# Patient Record
Sex: Male | Born: 1970 | Race: White | Hispanic: No | Smoking: Current some day smoker
Health system: Southern US, Community
[De-identification: ages and names within clinical notes are randomized; demographics above are authoritative.]

## PROBLEM LIST (undated history)

## (undated) DIAGNOSIS — F329 Major depressive disorder, single episode, unspecified: Secondary | ICD-10-CM

## (undated) DIAGNOSIS — I10 Essential (primary) hypertension: Secondary | ICD-10-CM

## (undated) DIAGNOSIS — F32A Depression, unspecified: Secondary | ICD-10-CM

## (undated) DIAGNOSIS — F17200 Nicotine dependence, unspecified, uncomplicated: Secondary | ICD-10-CM

## (undated) DIAGNOSIS — E785 Hyperlipidemia, unspecified: Secondary | ICD-10-CM

## (undated) DIAGNOSIS — A159 Respiratory tuberculosis unspecified: Secondary | ICD-10-CM

## (undated) DIAGNOSIS — R809 Proteinuria, unspecified: Secondary | ICD-10-CM

## (undated) DIAGNOSIS — E119 Type 2 diabetes mellitus without complications: Secondary | ICD-10-CM

## (undated) DIAGNOSIS — F909 Attention-deficit hyperactivity disorder, unspecified type: Secondary | ICD-10-CM

## (undated) DIAGNOSIS — K219 Gastro-esophageal reflux disease without esophagitis: Secondary | ICD-10-CM

## (undated) DIAGNOSIS — E1169 Type 2 diabetes mellitus with other specified complication: Secondary | ICD-10-CM

## (undated) DIAGNOSIS — F419 Anxiety disorder, unspecified: Secondary | ICD-10-CM

## (undated) DIAGNOSIS — N529 Male erectile dysfunction, unspecified: Secondary | ICD-10-CM

## (undated) DIAGNOSIS — G4700 Insomnia, unspecified: Secondary | ICD-10-CM

## (undated) DIAGNOSIS — R6882 Decreased libido: Secondary | ICD-10-CM

## (undated) DIAGNOSIS — F4010 Social phobia, unspecified: Secondary | ICD-10-CM

## (undated) DIAGNOSIS — S43006A Unspecified dislocation of unspecified shoulder joint, initial encounter: Secondary | ICD-10-CM

## (undated) HISTORY — DX: Major depressive disorder, single episode, unspecified: F32.9

## (undated) HISTORY — DX: Type 2 diabetes mellitus without complications: E11.9

## (undated) HISTORY — DX: Social phobia, unspecified: F40.10

## (undated) HISTORY — DX: Hyperlipidemia, unspecified: E78.5

## (undated) HISTORY — DX: Nicotine dependence, unspecified, uncomplicated: F17.200

## (undated) HISTORY — DX: Male erectile dysfunction, unspecified: N52.9

## (undated) HISTORY — DX: Proteinuria, unspecified: R80.9

## (undated) HISTORY — PX: NO PAST SURGERIES: SHX2092

## (undated) HISTORY — DX: Insomnia, unspecified: G47.00

## (undated) HISTORY — DX: Type 2 diabetes mellitus with other specified complication: E11.69

## (undated) HISTORY — DX: Depression, unspecified: F32.A

## (undated) HISTORY — DX: Attention-deficit hyperactivity disorder, unspecified type: F90.9

## (undated) HISTORY — DX: Unspecified dislocation of unspecified shoulder joint, initial encounter: S43.006A

## (undated) HISTORY — DX: Anxiety disorder, unspecified: F41.9

## (undated) HISTORY — DX: Decreased libido: R68.82

---

## 2006-07-13 DIAGNOSIS — S43006A Unspecified dislocation of unspecified shoulder joint, initial encounter: Secondary | ICD-10-CM

## 2006-07-13 HISTORY — DX: Unspecified dislocation of unspecified shoulder joint, initial encounter: S43.006A

## 2012-02-19 ENCOUNTER — Encounter (HOSPITAL_COMMUNITY): Payer: Self-pay | Admitting: Psychiatry

## 2012-02-19 ENCOUNTER — Ambulatory Visit (INDEPENDENT_AMBULATORY_CARE_PROVIDER_SITE_OTHER): Payer: BC Managed Care – PPO | Admitting: Psychiatry

## 2012-02-19 VITALS — BP 138/96 | HR 75 | Ht 71.0 in | Wt 260.0 lb

## 2012-02-19 DIAGNOSIS — F4010 Social phobia, unspecified: Secondary | ICD-10-CM

## 2012-02-19 DIAGNOSIS — F4001 Agoraphobia with panic disorder: Secondary | ICD-10-CM

## 2012-02-19 MED ORDER — PROPRANOLOL HCL 10 MG PO TABS: ORAL_TABLET | ORAL | Status: DC

## 2012-02-19 MED ORDER — SERTRALINE HCL 50 MG PO TABS: ORAL_TABLET | ORAL | Status: DC

## 2012-02-19 NOTE — Progress Notes (Signed)
Psychiatric Assessment Adult  Patient Identification:  Albert [name] Date of Evaluation:  02/19/2012  Chief Complaint:   Chief Complaint  Patient presents with  . Anxiety   History of Chief Complaint:   Albert [name] is a 41 y/o male with a past psychiatric history significant for symptoms of anxiety. The patient is referred for psychiatric services for psychiatric evaluation and medication management.   Anxiety Presents for initial visit. Onset was 1 to 5 years ago (Patient reports he always been "shy".   He states he  was still social  and this started 5 years ago. He  states that  he look up what was wrong on the computer, took a survey which diagnosed him with SAD.). The problem has been gradually worsening (He reports he is getting to point where he can't go out and  has difficultly with crowds.). Symptoms include excessive worry (About people thinking about him.), nausea, nervous/anxious behavior and obsessions (worries about being in a meeting every 3 months to hand out an award and has severe anxiety.). Patient reports no chest pain, confusion, depressed mood, dizziness, feeling of choking, hyperventilation (Has occurred 6 years ago, when he was in a situation where.), irritability (He gets irritated with himself.), muscle tension, palpitations, panic, restlessness, shortness of breath or suicidal ideas. Symptoms occur occasionally (He reports that he doesn't put himself in situations where he  will meet other people.). The most recent episode lasted 20 minutes. The severity of symptoms is moderate and interfering with daily activities. The symptoms are aggravated by social activities. The quality of sleep is good. Nighttime awakenings: occasional.   Risk factors include a major life event (Ended a relationship 5 years ago.). There is no history of suicide attempts. Past treatments include nothing. Compliance with prior treatments: No treatments tried.   Review of Systems  Constitutional:  Negative for fever, chills, diaphoresis, activity change, appetite change, irritability (He gets irritated with himself.), fatigue and unexpected weight change.  Respiratory: Negative for apnea, cough, choking, chest tightness, shortness of breath, wheezing and stridor.   Cardiovascular: Negative for chest pain, palpitations and leg swelling.  Gastrointestinal: Positive for nausea and rectal pain. Negative for vomiting, abdominal pain, diarrhea, constipation, blood in stool, abdominal distention and anal bleeding.  Neurological: Negative for dizziness.  Psychiatric/Behavioral: Negative for suicidal ideas and confusion. The patient is nervous/anxious.    Physical Exam  Vitals reviewed. Constitutional: He appears well-developed and well-nourished. No distress.  Skin: He is not diaphoretic.    Depressive Symptoms: anhedonia,  (Hypo) Manic Symptoms:   Elevated Mood:  No Irritable Mood:  No Grandiosity:  No Distractibility:  No Lability of Mood:  No Delusions:  No Hallucinations:  No Impulsivity:  No Sexually Inappropriate Behavior:  No Financial Extravagance:  No Flight of Ideas:  No  Psychotic Symptoms:  Hallucinations: No None Delusions:  No Paranoia:  No   Ideas of Reference:  No  PTSD Symptoms: Ever had a traumatic exposure:  No Had a traumatic exposure in the last month:  No Re-experiencing: No None Hypervigilance:  No Hyperarousal: No None Avoidance: No None  Traumatic Brain Injury: No   Past Psychiatric History: Diagnosis:ADHD treated by Dr. Alphonzo Lemmings  Hospitalizations: Patient denies.  Outpatient Care: Patient was seeing patient up  Until 3 years ago. Saw him for 4-5 years.  Substance Abuse Care: Patient denies.  Self-Mutilation: Patient denies.  Suicidal Attempts: Patient denies  Violent Behaviors: Patient denies.   Past Medical History:   Past Medical History  Diagnosis Date  .  Shoulder dislocation 2008    Left shoulder   History of Loss of Consciousness:   No Seizure History:  No Cardiac History:  No  Allergies:  No Known Allergies  Current Medications:  Current Outpatient Prescriptions  Medication Sig Dispense Refill  . ibuprofen (ADVIL,MOTRIN) 200 MG tablet Take 200 mg by mouth every 6 (six) hours as needed.        Previous Psychotropic Medications:  Medication   Adderall   Substance Abuse History in the last 12 months: Caffeine: 1-2 cups  Tobacco: 1 PPD-25 years Alcohol: 1 drink a year Illicit: Patient denies.  Medical Consequences of Substance Abuse: N/A  Legal Consequences of Substance Abuse: N/A  Family Consequences of Substance Abuse: N/A  Blackouts:  No DT's:  No Withdrawal Symptoms:  No   Social History: Current Place of Residence: [place], [place] Place of Birth: [place], [place] Family Members: Youngest child of three, has an older and younger brother. Marital Status:  Single Children: None Relationships: The patient reports that he has no main source of emotional support. Education:  College-Did 2 years of community. Educational Problems/Performance: Quit school to be close to his girlfriend. Religious Beliefs/Practices: Not practicing. 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 Legal History: None Hobbies/Interests: Watching sports  Family History:   Family History  Problem Relation Age of Onset  . Hypertension Mother   . Diabetes type II Mother   . Hypertension Sister   . Hypertension Maternal Grandmother   . Diabetes type II Maternal Grandmother   . Breast cancer Paternal Grandfather   . Breast cancer Paternal Grandmother     Mental Status Examination/Evaluation: Objective:  Appearance: Casual and Fairly Groomed  Eye Contact::  Minimal  Speech:  Clear and Coherent and Normal Rate  Volume:  Normal  Mood:  "nervous"  Affect:  Restricted  Thought Process:  Coherent, Linear and Logical  Orientation:  Full    Thought Content:  WDL  Suicidal Thoughts:  No  Homicidal Thoughts:  No  Judgement:  Good  Insight:  Good  Psychomotor Activity:  Normal  Akathisia:  No  Handed:  Right  Memory: Intact immediate and recent  Assets:  Communication Skills Desire for Improvement Financial Resources/Insurance Housing Physical Health Social Support Talents/Skills Transportation Vocational/Educational    Laboratory/X-Ray Psychological Evaluation(s)  None  None   Assessment:   AXIS I Panic Disorder with Agoraphobia, Social Anxiety Disorder  AXIS II Cluster C Traits  AXIS III . Shoulder dislocation 2008    Left shoulder     AXIS IV occupational problems  AXIS V 51-60 moderate symptoms   Treatment Plan/Recommendations:  PLAN:  1. Affirm with the patient that the medications are taken as ordered. Patient expressed understanding of how their medications were to be used.  2. Start the following psychiatric medications:  a) Sertraline-50 mg-take a half tablet for 7 days, then one tablet daily. b) Propranolol 10 mg- one half to one tablet daily as needed for anxiety. 3. Therapy: brief supportive therapy provided. Continue current services.  4. Risks and benefits, side effects and alternatives discussed with patient, he/she was given an opportunity to ask questions about his/her medication, illness, and treatment. All current psychiatric  medications have been reviewed and discussed with the patient and adjusted as clinically appropriate. The patient has been provided an accurate and updated list of the medications being now prescribed.  5. Patient told to call clinic if any problems occur. Patient advised  to go to ER  if he should develop SI/HI, side effects, or if symptoms worsen. Has crisis numbers to call if needed.   6. No labs warranted at this time.  7. The patient was encouraged to keep all PCP and specialty clinic appointments.  8. Patient was instructed to return to clinic in 1 month.  9.  The patient was advised to call and cancel their mental health appointment within 24 hours of the appointment, if they are unable to keep the appointment.  10. The patient expressed understanding of the plan and agrees with the above.   Jacqulyn Cane, MD 8/9/20139:00 AM

## 2012-02-22 DIAGNOSIS — F4010 Social phobia, unspecified: Secondary | ICD-10-CM | POA: Insufficient documentation

## 2012-02-22 DIAGNOSIS — F4001 Agoraphobia with panic disorder: Secondary | ICD-10-CM | POA: Insufficient documentation

## 2012-03-17 ENCOUNTER — Ambulatory Visit (INDEPENDENT_AMBULATORY_CARE_PROVIDER_SITE_OTHER): Payer: BC Managed Care – PPO | Admitting: Psychiatry

## 2012-03-17 ENCOUNTER — Encounter (HOSPITAL_COMMUNITY): Payer: Self-pay | Admitting: Psychiatry

## 2012-03-17 VITALS — BP 139/88 | HR 77 | Ht 71.0 in | Wt 254.0 lb

## 2012-03-17 DIAGNOSIS — F4010 Social phobia, unspecified: Secondary | ICD-10-CM

## 2012-03-17 DIAGNOSIS — F411 Generalized anxiety disorder: Secondary | ICD-10-CM

## 2012-03-17 DIAGNOSIS — F4001 Agoraphobia with panic disorder: Secondary | ICD-10-CM

## 2012-03-17 MED ORDER — SERTRALINE HCL 50 MG PO TABS: ORAL_TABLET | ORAL | Status: DC

## 2012-03-17 NOTE — Progress Notes (Signed)
[facility] Behavioral Health Follow-up Outpatient Visit  Albert [name] 02/04/1971  Date: 03/17/2012  History of Chief Complaint:  Albert [name] is a 41 y/o male with a past psychiatric history significant for symptoms of anxiety. The patient is referred for psychiatric services for medication management.   The patient reports that he had an evaluation today at work which . He reports that he was not able to debate his point of view which was frustrating.  In the area of affective symptoms, patient appears euthymic. Patient denies current suicidal ideation, intent, or plan. Patient denies current homicidal ideation, intent, or plan. Patient denies auditory hallucinations. Patient denies visual hallucinations. Patient denies symptoms of paranoia. Patient states sleep is fair, with approximately 6 hours of sleep per night. Appetite is good. Energy level is good. Patient endorses symptoms of anhedonia. Patient denies hopelessness, helplessness, or guilt.   Denies any recent episodes consistent with mania, particularly decreased need for sleep with increased energy, grandiosity, impulsivity, hyperverbal and pressured speech, or increased productivity. Denies any recent symptoms consistent with psychosis, particularly auditory or visual hallucinations, thought broadcasting/insertion/withdrawal, or ideas of reference. 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 Denies any history of trauma or symptoms consistent with PTSD such as flashbacks, nightmares, hypervigilance, feelings of numbness or inability to connect with others.   The patient states he is taking all his medications denies any side effects from his medications. He denies any other complaints.   Review of Systems  Constitutional: Negative for fever, chills, diaphoresis, activity change, appetite change, fatigue and unexpected weight change.  Respiratory: Negative for  apnea, cough, choking, chest tightness, shortness of breath, wheezing and stridor.  Cardiovascular: Negative for chest pain, palpitations and leg swelling.  Gastrointestinal: Negative for nausea, vomiting, abdominal pain, diarrhea, constipation, blood in stool, abdominal distention and anal bleeding.  Neurological: Negative for dizziness.   Filed Vitals:   03/17/12 1034  BP: 139/88  Pulse: 77  Height: 5\' 11"  (1.803 m)  Weight: 254 lb (115.214 kg)   Physical Exam  Vitals reviewed.  Constitutional: He appears well-developed and well-nourished. No distress.  Skin: He is not diaphoretic.   Traumatic Brain Injury: No   Past Psychiatric History: Reviewed Diagnosis:ADHD treated by Dr. Alphonzo Lemmings   Hospitalizations: Patient denies.   Outpatient Care: Patient was seeing patient up Until 3 years ago. Saw him for 4-5 years.   Substance Abuse Care: Patient denies.   Self-Mutilation: Patient denies.   Suicidal Attempts: Patient denies   Violent Behaviors: Patient denies.    Past Medical History: Reviewed Past Medical History   Diagnosis  Date   .  Shoulder dislocation  2008     Left shoulder    History of Loss of Consciousness: No  Seizure History: No  Cardiac History: No  Allergies: No Known Allergies   Current Medications: Reviewed Current Outpatient Prescriptions on File Prior to Visit  Medication Sig Dispense Refill  . ibuprofen (ADVIL,MOTRIN) 200 MG tablet Take 200 mg by mouth every 6 (six) hours as needed.      . propranolol (INDERAL) 10 MG tablet Take one half to one tablet daily.  30 tablet  0  . sertraline (ZOLOFT) 50 MG tablet Take a half tablet for 7 days, then increase to one tablet daily.  30 tablet  0   Previous Psychotropic Medications: Reviewed Medication   Adderall    Substance Abuse History in the last 12 months:  Caffeine: 1-2  cups  Tobacco: 1 PPD-25 years  Alcohol: 1 drink a year  Illicit: Patient denies.   Medical Consequences of Substance Abuse: N/A    Legal Consequences of Substance Abuse: N/A  Family Consequences of Substance Abuse: N/A  Blackouts: No  DT's: No  Withdrawal Symptoms: No   Social History: Reviewed Current Place of Residence: [place], [place]  Place of Birth: [place], [place]  Family Members: Youngest child of three, has an older and younger brother.  Marital Status: Single  Children: None  Relationships: The patient reports that he has no main source of emotional support.  Education: College-Did 2 years of community.  Educational Problems/Performance: Quit school to be close to his girlfriend.  Religious Beliefs/Practices: Not practicing.  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  Legal History: None  Hobbies/Interests: Watching sports   Family History: Reviewed Family History   Problem  Relation  Age of Onset   .  Hypertension  Mother    .  Diabetes type II  Mother    .  Hypertension  Sister    .  Hypertension  Maternal Grandmother    .  Diabetes type II  Maternal Grandmother    .  Breast cancer  Paternal Grandfather    .  Breast cancer  Paternal Grandmother      Mental Status Examination/Evaluation:  Objective: Appearance: Casual and Fairly Groomed   Eye Contact:: Minimal   Speech: Clear and Coherent and Normal Rate   Volume: Normal   Mood: "normal"   Affect: Restricted   Thought Process: Coherent, Linear and Logical   Orientation: Full   Thought Content: WDL   Suicidal Thoughts: No   Homicidal Thoughts: No   Judgement: Good   Insight: Good   Psychomotor Activity: Normal   Akathisia: No   Handed: Right   Memory: Intact immediate and recent   Assets: Communication Skills  Desire for Improvement  Financial Resources/Insurance  Housing  Physical Health  Social Support  Talents/Skills  Transportation  Vocational/Educational    Laboratory/X-Ray  Psychological Evaluation(s)   None  None    Assessment:  AXIS  I  Panic Disorder with Agoraphobia, Social Anxiety Disorder   AXIS II  Cluster C Traits   AXIS III  .  Shoulder dislocation  2008      Left shoulder   AXIS IV  occupational problems   AXIS V  51-60 moderate symptoms    Treatment Plan/Recommendations:    1. Affirm with the patient that the medications are taken as ordered. Patient expressed understanding of how their medications were to be used.  2. Start the following psychiatric medications:  a) Increase Sertraline-50 mg-one and a half tablet for 14 days, and two tablets weeks.  b) Propranolol 10 mg- one half to one tablet daily as needed for anxiety. Asked patient to take his first dose in the presence of a family member given possible hypotensive episode. 3. Therapy: brief supportive therapy provided. Continue current services.  4. Risks and benefits, side effects and alternatives discussed with patient, he was given an opportunity to ask questions about his/her medication, illness, and treatment. All current psychiatric  medications have been reviewed and discussed with the patient and adjusted as clinically appropriate. The patient has been provided an accurate and updated list of the medications being now prescribed.  5. Patient told to call clinic if any problems occur. Patient advised to go to ER if he should  develop SI/HI, side effects, or if symptoms worsen. Has crisis numbers to call if needed.  6. No labs warranted at this time.  7. The patient was encouraged to keep all PCP and specialty clinic appointments.  8. Patient was instructed to return to clinic in 1 month.  9. The patient was advised to call and cancel their mental health appointment within 24 hours of the appointment, if they are unable to keep the appointment.  10. The patient expressed understanding of the plan and agrees with the above.  Jacqulyn Cane, MD

## 2012-04-21 ENCOUNTER — Ambulatory Visit (HOSPITAL_COMMUNITY): Payer: BC Managed Care – PPO | Admitting: Psychiatry

## 2012-04-27 ENCOUNTER — Ambulatory Visit (HOSPITAL_COMMUNITY): Payer: BC Managed Care – PPO | Admitting: Psychiatry

## 2012-05-03 ENCOUNTER — Encounter (HOSPITAL_COMMUNITY): Payer: Self-pay | Admitting: Psychiatry

## 2012-05-03 ENCOUNTER — Ambulatory Visit (INDEPENDENT_AMBULATORY_CARE_PROVIDER_SITE_OTHER): Payer: BC Managed Care – PPO | Admitting: Psychiatry

## 2012-05-03 VITALS — BP 134/88 | HR 86 | Ht 71.0 in | Wt 258.0 lb

## 2012-05-03 DIAGNOSIS — F4010 Social phobia, unspecified: Secondary | ICD-10-CM

## 2012-05-03 DIAGNOSIS — F4001 Agoraphobia with panic disorder: Secondary | ICD-10-CM

## 2012-05-03 DIAGNOSIS — F411 Generalized anxiety disorder: Secondary | ICD-10-CM

## 2012-05-03 MED ORDER — SERTRALINE HCL 100 MG PO TABS: ORAL_TABLET | ORAL | Status: DC

## 2012-05-03 NOTE — Progress Notes (Signed)
[facility] Behavioral Health Follow-up Outpatient Visit  Albert [name] Apr 17, 1971  Date: 05/03/2012  History of Chief Complaint:  Albert [name] is a 41 y/o male with a past psychiatric history significant for symptoms of anxiety. The patient is referred for psychiatric services for medication management.   The patient reports that he had an loss management inspection and he has been at work since 8 AM.  The patient reports that he feels stressed about the increasing workload at work.  He states he would like to start dating again and is interested in a certain person but is not able to speak to her due to anxiety. He reports he is taking his medications as prescribe and denies any side effects.   In the area of affective symptoms, patient appears euthymic. Patient denies current suicidal ideation, intent, or plan. Patient denies current homicidal ideation, intent, or plan. Patient denies auditory hallucinations. Patient denies visual hallucinations. Patient denies symptoms of paranoia. Patient states sleep is poor, with approximately 5 hours of sleep per night, secondary to shift changes. Appetite is good. Energy level is good. Patient endorses symptoms of anhedonia. Patient denies hopelessness, helplessness, or guilt.   Denies any recent episodes consistent with mania, particularly decreased need for sleep with increased energy, grandiosity, impulsivity, hyperverbal and pressured speech, or increased productivity. Denies any recent symptoms consistent with psychosis, particularly auditory or visual hallucinations, thought broadcasting/insertion/withdrawal, or ideas of reference. 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 Denies any history of trauma or symptoms consistent with PTSD such as flashbacks, nightmares, hypervigilance, feelings of numbness or inability to connect with others.  The patient states  he is taking all his medications denies any side effects from his medications. He denies any other complaints.   Review of Systems  Constitutional: Negative for fever, chills, diaphoresis, activity change, appetite change, fatigue and unexpected weight change.  Respiratory: Negative for apnea, cough, choking, chest tightness, shortness of breath, wheezing and stridor.  Cardiovascular: Negative for chest pain, palpitations and leg swelling.  Gastrointestinal: Negative for nausea, vomiting, abdominal pain, diarrhea, constipation, blood in stool, abdominal distention and anal bleeding.    Filed Vitals:   05/03/12 1030  BP: 133/90  Pulse: 86  Height: 5\' 11"  (1.803 m)  Weight: 258 lb (117.028 kg)   Physical Exam  Vitals reviewed.  Constitutional: He appears well-developed and well-nourished. No distress.  Skin: He is not diaphoretic.    Traumatic Brain Injury: No   Past Psychiatric History: Reviewed  Diagnosis:ADHD treated by Dr. Alphonzo Lemmings   Hospitalizations: Patient denies.   Outpatient Care: Patient was seeing patient up Until 3 years ago. Saw him for 4-5 years.   Substance Abuse Care: Patient denies.   Self-Mutilation: Patient denies.   Suicidal Attempts: Patient denies   Violent Behaviors: Patient denies.    Past Medical History: Reviewed  Past Medical History   Diagnosis  Date   .  Shoulder dislocation  2008     Left shoulder    History of Loss of Consciousness: No  Seizure History: No  Cardiac History: No  Allergies: No Known Allergies   Current Medications: Reviewed  Current Outpatient Prescriptions on File Prior to Visit  Medication Sig Dispense Refill  . ibuprofen (ADVIL,MOTRIN) 200 MG tablet Take 200 mg by mouth every 6 (six) hours as needed.      . propranolol (INDERAL) 10 MG tablet Take one tablet daily.  30  tablet  0  .  sertraline (ZOLOFT) 50 MG tablet Take  two tablets daily (100 mg.)  60 tablet  1    Previous Psychotropic Medications: Reviewed  Medication    Adderall    Substance Abuse History in the last 12 months:  Caffeine: None Tobacco: 1 PPD-25 years  Alcohol: 1 drink a year  Illicit: Patient denies.   Medical Consequences of Substance Abuse: N/A  Legal Consequences of Substance Abuse: N/A  Family Consequences of Substance Abuse: N/A  Blackouts: No  DT's: No   Withdrawal Symptoms: No   Social History: Reviewed  Current Place of Residence: [place], [place]  Place of Birth: [place], [place]  Family Members: Youngest child of three, has an older and younger brother.  Marital Status: Single  Children: None  Relationships: The patient reports that he has no main source of emotional support.  Education: College-Did 2 years of community.  Educational Problems/Performance: Quit school to be close to his girlfriend.  Religious Beliefs/Practices: Not practicing.  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  Legal History: None  Hobbies/Interests: Watching sports   Family History: Reviewed  Family History   Problem  Relation  Age of Onset   .  Hypertension  Mother    .  Diabetes type II  Mother    .  Hypertension  Sister    .  Hypertension  Maternal Grandmother    .  Diabetes type II  Maternal Grandmother    .  Breast cancer  Paternal Grandfather    .  Breast cancer  Paternal Grandmother     Mental Status Examination/Evaluation:  Objective: Appearance: Casual and Fairly Groomed   Eye Contact:: Minimal   Speech: Clear and Coherent and Normal Rate   Volume: Normal   Mood: "fine"   Affect: Restricted   Thought Process: Coherent, Linear and Logical   Orientation: Full   Thought Content: WDL   Suicidal Thoughts: No   Homicidal Thoughts: No   Judgement: Good   Insight: Good   Psychomotor Activity: Normal   Akathisia: No   Handed: Right   Memory: Intact immediate and recent   Assets: Communication Skills  Desire for Improvement  Financial  Resources/Insurance  Housing  Physical Health  Social Support  Talents/Skills  Transportation  Vocational/Educational    Laboratory/X-Ray  Psychological Evaluation(s)   None  None    Assessment:  AXIS I  Panic Disorder with Agoraphobia, Social Anxiety Disorder   AXIS II  Cluster C Traits   AXIS III  .  Shoulder dislocation  2008      Left shoulder   AXIS IV  occupational problems   AXIS V  51-60 moderate symptoms    Treatment Plan/Recommendations:  1. Affirm with the patient that the medications are taken as ordered. Patient expressed understanding of how their medications were to be used.  2. Continue the following psychiatric medications with the following changes:  a) Increase Sertraline-100 mg-one and a half tablet for 14 days, and two tablets weeks.  b) Propranolol 10 mg- one and a half to two tablet daily as needed for anxiety. Cautioned patient about side effects of the medication.  3. Therapy: brief supportive therapy provided. Continue current services.  4. Risks and benefits, side effects and alternatives discussed with patient, he was given an opportunity to ask questions about his/her medication, illness, and treatment. All current psychiatric  medications have been reviewed and discussed with  the patient and adjusted as clinically appropriate. The patient has been provided an accurate and updated list of the medications being now prescribed.  5. Patient told to call clinic if any problems occur. Patient advised to go to ER if he should develop SI/HI, side effects, or if symptoms worsen. Has crisis numbers to call if needed.  6. No labs warranted at this time.  7. The patient was encouraged to keep all PCP and specialty clinic appointments.  8. Patient was instructed to return to clinic in 1 month.  9. The patient was advised to call and cancel their mental health appointment within 24 hours of the appointment, if they are unable to keep the appointment.  10. The patient  expressed understanding of the plan and agrees with the above.     Jacqulyn Cane, MD

## 2012-06-03 ENCOUNTER — Ambulatory Visit (HOSPITAL_COMMUNITY): Payer: BC Managed Care – PPO | Admitting: Psychiatry

## 2012-06-14 ENCOUNTER — Encounter (HOSPITAL_COMMUNITY): Payer: Self-pay | Admitting: Psychiatry

## 2012-06-14 ENCOUNTER — Ambulatory Visit (INDEPENDENT_AMBULATORY_CARE_PROVIDER_SITE_OTHER): Payer: BC Managed Care – PPO | Admitting: Psychiatry

## 2012-06-14 VITALS — BP 114/97 | HR 77 | Ht 71.0 in | Wt 270.0 lb

## 2012-06-14 DIAGNOSIS — F4001 Agoraphobia with panic disorder: Secondary | ICD-10-CM

## 2012-06-14 DIAGNOSIS — F4010 Social phobia, unspecified: Secondary | ICD-10-CM

## 2012-06-14 MED ORDER — PROPRANOLOL HCL 10 MG PO TABS: ORAL_TABLET | ORAL | Status: DC

## 2012-06-14 MED ORDER — VENLAFAXINE HCL ER 37.5 MG PO CP24: ORAL_CAPSULE | ORAL | Status: DC

## 2012-06-14 MED ORDER — SERTRALINE HCL 50 MG PO TABS: ORAL_TABLET | ORAL | Status: DC

## 2012-06-14 NOTE — Progress Notes (Signed)
[facility] Follow-up Outpatient Visit  Albert [name] 18-May-1971  Date: 06/14/2012  History of Chief Complaint:  Albert [name] is a 41 y/o male with a past psychiatric history significant for Panic Disorder with Agoraphobia, Social Anxiety Disorder . The patient is referred for psychiatric services for medication management.   The patient endorses continue anxiety and avoidance despite being on the maximum dose of sertraline. 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 The patient request a change of medications.  He endorses continued isolation secondary to social anxiety. He reports using propranolol successfully for anticipated stressful events but would like to change medications to have better control of his anxiety.  In the area of affective symptoms, patient appears anxious. Patient denies current suicidal ideation, intent, or plan. Patient denies current homicidal ideation, intent, or plan. Patient denies auditory hallucinations. Patient denies visual hallucinations. Patient denies symptoms of paranoia. Patient states sleep is good with approximately 5 -7 hours of sleep per night, secondary to shift changes. Appetite is good. Energy level is good. Patient  endorsed symptoms of anhedonia. Patient denies hopelessness, helplessness, or guilt.   Denies any recent episodes consistent with mania, particularly decreased need for sleep with increased energy, grandiosity, impulsivity, hyperverbal and pressured speech, or increased productivity. Denies any recent symptoms consistent with psychosis, particularly auditory or visual hallucinations, thought broadcasting/insertion/withdrawal, or ideas of reference. 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 Denies any history of trauma or symptoms consistent with PTSD such as  flashbacks, nightmares, hypervigilance, feelings of numbness or inability to connect with others.   The patient states he is taking all his medications denies any side effects from his medications. He denies any other complaints.   Review of Systems  Constitutional: Negative for fever, chills, diaphoresis, activity change, appetite change, fatigue and unexpected weight change.  Respiratory: Negative for apnea, cough, choking, chest tightness, shortness of breath, wheezing and stridor.  Cardiovascular: Negative for chest pain, palpitations and leg swelling.  Gastrointestinal: Negative for nausea, vomiting, abdominal pain, diarrhea, constipation, blood in stool, abdominal distention and anal bleeding.   Filed Vitals:   06/14/12 1023  BP: 114/97  Pulse: 77  Height: 5\' 11"  (1.803 m)  Weight: 270 lb (122.471 kg)   Physical Exam  Vitals reviewed.  Constitutional: He appears well-developed and well-nourished. No distress.  Skin: He is not diaphoretic.   Traumatic Brain Injury: No   Past Psychiatric History: Reviewed  Diagnosis:ADHD treated by Dr. Alphonzo Lemmings   Hospitalizations: Patient denies.   Outpatient Care: Patient was seeing patient up Until 3 years ago. Saw him for 4-5 years.   Substance Abuse Care: Patient denies.   Self-Mutilation: Patient denies.   Suicidal Attempts: Patient denies   Violent Behaviors: Patient denies.    Past Medical History: Reviewed  Past Medical History  Diagnosis Date  . Shoulder dislocation 2008    Left shoulder   History of Loss of Consciousness: No  Seizure History: No  Cardiac History: No  Allergies: No Known Allergies  Current Medications: Reviewed   Previous Psychotropic Medications: Reviewed  Medication   Adderall    Substance Abuse History in the last 12 months:  Caffeine: None  Tobacco: 1 PPD-25 years  Alcohol: 1 drink a year  Illicit: Patient denies.   Medical Consequences of Substance Abuse: N/A  Legal Consequences of Substance  Abuse:  N/A  Family Consequences of Substance Abuse: N/A  Blackouts: No  DT's: No  Withdrawal Symptoms: No   Social History: Reviewed  Current Place of Residence: [place], [place]  Place of Birth: [place], [place]  Family Members: Youngest child of three, has an older and younger brother.  Marital Status: Single  Children: None  Relationships: The patient reports that he has no main source of emotional support.  Education: College-Did 2 years of community.  Educational Problems/Performance: Quit school to be close to his girlfriend.  Religious Beliefs/Practices: Not practicing.  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  Legal History: None  Hobbies/Interests: Watching sports   Family History: Reviewed  Family History  Problem Relation Age of Onset  . Hypertension Mother   . Diabetes type II Mother   . Hypertension Sister   . Hypertension Maternal Grandmother   . Diabetes type II Maternal Grandmother   . Breast cancer Paternal Grandfather   . Breast cancer Paternal Grandmother      Mental Status Examination/Evaluation:  Objective: Appearance: Casual and Fairly Groomed   Eye Contact:: Minimal   Speech: Clear and Coherent and Normal Rate   Volume: Normal   Mood: "average"   Affect: Restricted   Thought Process: Coherent, Linear and Logical   Orientation: Full   Thought Content: WDL   Suicidal Thoughts: No   Homicidal Thoughts: No   Judgement: Good   Insight: Good   Psychomotor Activity: Normal   Akathisia: No   Handed: Right   Memory: Intact immediate and recent   Assets: Communication Skills  Desire for Improvement  Financial Resources/Insurance  Housing  Physical Health  Social Support  Talents/Skills  Transportation  Vocational/Educational    Laboratory/X-Ray  Psychological Evaluation(s)   None  None   Assessment:  AXIS I  Panic Disorder with Agoraphobia, Social Anxiety  Disorder   AXIS II  Cluster C Traits   AXIS III  .  Shoulder dislocation  2008      Left shoulder   AXIS IV  occupational problems   AXIS V  51-60 moderate symptoms    Treatment Plan/Recommendations:  1. Affirm with the patient that the medications are taken as ordered. Patient expressed understanding of how their medications were to be used.  2. Continue the following psychiatric medications with the following changes:  a)  Taper sertraline: 200 mg for 7 days, then 150 mg  For 7 days, then 100 mg for 7 days, then 50 mg for 7 days, then stop. b) Venlafaxine 37.5 mg Take one tablet for 7 days, then 2 tablets for 7 days, then 3 tablet daily for 7 days, the four tablets daily. c) Propranolol 10 mg- one and a half to two tablet daily as needed for anxiety. Cautioned patient about side effects of the medication.  3. Therapy: brief supportive therapy provided. Continue current services. EMDR is an option for this option. 4. Risks and benefits, side effects and alternatives discussed with patient, he was given an opportunity to ask questions about his medication, illness, and treatment. All current psychiatric  medications have been reviewed and discussed with the patient and adjusted as clinically appropriate. The patient has been provided an accurate and updated list of the medications being now prescribed.  5. Patient told to call clinic if any problems occur. Patient advised to go to ER if he should develop SI/HI, side effects, or if symptoms worsen. Has crisis numbers to call  if needed.  6. No labs warranted at this time.  7. The patient was encouraged to keep all PCP and specialty clinic appointments.  8. Patient was instructed to return to clinic in 1 month.  9. The patient was advised to call and cancel their mental health appointment within 24 hours of the appointment, if they are unable to keep the appointment.  10. The patient expressed understanding of the plan and agrees with the above.       Jacqulyn Cane, MD

## 2012-07-11 ENCOUNTER — Other Ambulatory Visit (HOSPITAL_COMMUNITY): Payer: Self-pay | Admitting: Psychiatry

## 2012-07-12 ENCOUNTER — Other Ambulatory Visit (HOSPITAL_COMMUNITY): Payer: Self-pay

## 2012-07-12 ENCOUNTER — Ambulatory Visit (HOSPITAL_COMMUNITY): Payer: BC Managed Care – PPO | Admitting: Psychiatry

## 2012-07-12 DIAGNOSIS — F4010 Social phobia, unspecified: Secondary | ICD-10-CM

## 2012-07-12 MED ORDER — VENLAFAXINE HCL ER 75 MG PO CP24: 150.0000 mg | ORAL_CAPSULE | Freq: Every day | ORAL | Status: DC

## 2012-07-12 NOTE — Telephone Encounter (Signed)
Patient is currently taking 150 mg daily.  Will call in refills for Effexor XR.

## 2012-07-14 ENCOUNTER — Other Ambulatory Visit (HOSPITAL_COMMUNITY): Payer: Self-pay | Admitting: Psychiatry

## 2012-07-22 ENCOUNTER — Ambulatory Visit (HOSPITAL_COMMUNITY): Payer: BC Managed Care – PPO | Admitting: Psychiatry

## 2012-07-28 ENCOUNTER — Ambulatory Visit (HOSPITAL_COMMUNITY): Payer: BC Managed Care – PPO | Admitting: Psychiatry

## 2012-07-29 ENCOUNTER — Encounter (HOSPITAL_COMMUNITY): Payer: Self-pay | Admitting: Psychiatry

## 2012-07-29 ENCOUNTER — Ambulatory Visit (INDEPENDENT_AMBULATORY_CARE_PROVIDER_SITE_OTHER): Payer: Self-pay | Admitting: Psychiatry

## 2012-07-29 VITALS — BP 125/82 | HR 76 | Ht 71.0 in | Wt 266.0 lb

## 2012-07-29 DIAGNOSIS — F411 Generalized anxiety disorder: Secondary | ICD-10-CM

## 2012-07-29 DIAGNOSIS — F4001 Agoraphobia with panic disorder: Secondary | ICD-10-CM

## 2012-07-29 DIAGNOSIS — F4010 Social phobia, unspecified: Secondary | ICD-10-CM

## 2012-07-29 MED ORDER — VENLAFAXINE HCL ER 75 MG PO CP24: 150.0000 mg | ORAL_CAPSULE | Freq: Every day | ORAL | Status: DC

## 2012-07-29 MED ORDER — PROPRANOLOL HCL 10 MG PO TABS: ORAL_TABLET | ORAL | Status: DC

## 2012-07-29 NOTE — Progress Notes (Signed)
[facility] Follow-up Outpatient Visit  Albert [name] 09-11-1970  Date: 07/29/2012  History of Chief Complaint:  Albert [name] is a 42 y/o male with a past psychiatric history significant for Panic Disorder with Agoraphobia, Social Anxiety Disorder . The patient is referred for psychiatric services for medication management.   The patient reports that his anxiety has decreased significantly since starting venlafaxine. 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  He has taken Propranolol several times before presentations and feels he is ready to try a presentation without propranolol.  He states his social anxiety has decreased significantly, though he could not even post comments on web sites in the past, he has been posting comments on social anxiety web sites.  He reports that he is taking his medications daily and denies any side effects.   In the area of affective symptoms, patient appears euthymic. Patient denies current suicidal ideation, intent, or plan. Patient denies current homicidal ideation, intent, or plan. Patient denies auditory hallucinations. Patient denies visual hallucinations. Patient denies symptoms of paranoia. Patient states sleep is good with approximately 6 hours of sleep per night, secondary to shift changes. Appetite is good. Energy level is good. Patient  endorsed symptoms of anhedonia. Patient denies hopelessness, helplessness, or guilt.   Denies any recent episodes consistent with mania, particularly decreased need for sleep with increased energy, grandiosity, impulsivity, hyperverbal and pressured speech, or increased productivity. Denies any recent symptoms consistent with psychosis, particularly auditory or visual hallucinations, thought broadcasting/insertion/withdrawal, or ideas of reference. 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 Denies any history of trauma or symptoms consistent with PTSD such as flashbacks, nightmares, hypervigilance, feelings of numbness or inability to connect with others.    Review of Systems  Constitutional: Negative for fever, chills, diaphoresis, activity change, appetite change, fatigue and unexpected weight change.  Respiratory: Negative for apnea, cough, choking, chest tightness, shortness of breath, wheezing and stridor.  Cardiovascular: Negative for chest pain, palpitations and leg swelling.  Gastrointestinal: Negative for nausea, vomiting, abdominal pain, diarrhea, constipation, blood in stool, abdominal distention and anal bleeding.   Filed Vitals:   07/29/12 1029  BP: 125/82  Pulse: 76  Height: 5\' 11"  (1.803 m)  Weight: 266 lb (120.657 kg)   Physical Exam  Vitals reviewed.  Constitutional: He appears well-developed and well-nourished. No distress.  Skin: He is not diaphoretic.   Traumatic Brain Injury: No   Past Psychiatric History: Reviewed  Diagnosis:ADHD treated by Dr. Alphonzo Lemmings   Hospitalizations: Patient denies.   Outpatient Care: Patient was seeing patient up Until 3 years ago. Saw him for 4-5 years.   Substance Abuse Care: Patient denies.   Self-Mutilation: Patient denies.   Suicidal Attempts: Patient denies   Violent Behaviors: Patient denies.    Past Medical History: Reviewed  Past Medical History  Diagnosis Date  . Shoulder dislocation 2008    Left shoulder   History of Loss of Consciousness: No  Seizure History: No  Cardiac History: No  Allergies: No Known Allergies  Current Medications: Reviewed   Previous Psychotropic Medications: Reviewed  Medication   Adderall    Substance Abuse History in the last 12 months:  Caffeine: None  Tobacco: 1 PPD-25 years  Alcohol: 1 drink a year  Illicit: Patient denies.   Medical Consequences of Substance  Abuse: N/A  Legal Consequences of  Substance Abuse: N/A  Family Consequences of Substance Abuse: N/A  Blackouts: No  DT's: No  Withdrawal Symptoms: No   Social History: Reviewed  Current Place of Residence: [place], [place]  Place of Birth: [place], [place]  Family Members: Youngest child of three, has an older and younger brother.  Marital Status: Single  Children: None  Relationships: The patient reports that he has no main source of emotional support.  Education: College-Did 2 years of community.  Educational Problems/Performance: Quit school to be close to his girlfriend.  Religious Beliefs/Practices: Not practicing.  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  Legal History: None  Hobbies/Interests: Watching sports   Family History: Reviewed  Family History  Problem Relation Age of Onset  . Hypertension Mother   . Diabetes type II Mother   . Hypertension Sister   . Hypertension Maternal Grandmother   . Diabetes type II Maternal Grandmother   . Breast cancer Paternal Grandfather   . Breast cancer Paternal Grandmother     Mental Status Examination/Evaluation:  Objective: Appearance: Casual and Fairly Groomed   Eye Contact:: Minimal   Speech: Clear and Coherent and Normal Rate   Volume: Normal   Mood: " good" 6/10  Affect: Restricted   Thought Process: Coherent, Linear and Logical   Orientation: Full   Thought Content: WDL   Suicidal Thoughts: No   Homicidal Thoughts: No   Judgement: Good   Insight: Good   Psychomotor Activity: Normal   Akathisia: No   Handed: Right   Memory: Intact immediate and recent   Assets: Communication Skills  Desire for Improvement  Financial Resources/Insurance  Housing  Physical Health  Social Support  Talents/Skills  Transportation  Vocational/Educational    Laboratory/X-Ray  Psychological Evaluation(s)   None  None   Assessment:  AXIS I  Panic Disorder with Agoraphobia, Social  Anxiety Disorder   AXIS II  Cluster C Traits   AXIS III  .  Shoulder dislocation  2008      Left shoulder   AXIS IV  occupational problems   AXIS V  51-60 moderate symptoms    Treatment Plan/Recommendations:  1. Affirm with the patient that the medications are taken as ordered. Patient expressed understanding of how their medications were to be used.  2. Continue the following psychiatric medications with the following changes:  A) Continue Venlafaxine 150 mg   c) Propranolol 10 mg- one and a half to two tablet daily as needed for anxiety. Cautioned patient about side effects of the medication.  3. Therapy: brief supportive therapy provided. Continue current services. EMDR is an option for this option. 4. Risks and benefits, side effects and alternatives discussed with patient, he was given an opportunity to ask questions about his medication, illness, and treatment. All current psychiatric  medications have been reviewed and discussed with the patient and adjusted as clinically appropriate. The patient has been provided an accurate and updated list of the medications being now prescribed.  5. Patient told to call clinic if any problems occur. Patient advised to go to ER if he should develop SI/HI, side effects, or if symptoms worsen. Has crisis numbers to call if needed.  6. No labs warranted at this time.  7. The patient was encouraged to keep all PCP and specialty clinic appointments.  8. Patient was instructed to return to clinic in 2 months.  9. The patient was  advised to call and cancel their mental health appointment within 24 hours of the appointment, if they are unable to keep the appointment.  10. The patient expressed understanding of the plan and agrees with the above.      Jacqulyn Cane, MD

## 2012-08-06 ENCOUNTER — Other Ambulatory Visit (HOSPITAL_COMMUNITY): Payer: Self-pay | Admitting: Psychiatry

## 2012-09-26 ENCOUNTER — Ambulatory Visit (HOSPITAL_COMMUNITY): Payer: Self-pay | Admitting: Psychiatry

## 2012-10-05 ENCOUNTER — Ambulatory Visit (HOSPITAL_COMMUNITY): Payer: 59 | Admitting: Psychiatry

## 2012-10-21 ENCOUNTER — Encounter (HOSPITAL_COMMUNITY): Payer: Self-pay | Admitting: Psychiatry

## 2012-10-21 ENCOUNTER — Ambulatory Visit (INDEPENDENT_AMBULATORY_CARE_PROVIDER_SITE_OTHER): Payer: 59 | Admitting: Psychiatry

## 2012-10-21 VITALS — BP 119/91 | HR 83 | Ht 71.0 in | Wt 261.0 lb

## 2012-10-21 DIAGNOSIS — F4001 Agoraphobia with panic disorder: Secondary | ICD-10-CM

## 2012-10-21 DIAGNOSIS — F411 Generalized anxiety disorder: Secondary | ICD-10-CM

## 2012-10-21 DIAGNOSIS — F4010 Social phobia, unspecified: Secondary | ICD-10-CM

## 2012-10-21 MED ORDER — VENLAFAXINE HCL ER 150 MG PO CP24: 150.0000 mg | ORAL_CAPSULE | Freq: Every day | ORAL | Status: DC

## 2012-10-21 MED ORDER — VENLAFAXINE HCL 37.5 MG PO TABS: 37.5000 mg | ORAL_TABLET | ORAL | Status: DC

## 2012-10-21 MED ORDER — PROPRANOLOL HCL 10 MG PO TABS: ORAL_TABLET | ORAL | Status: DC

## 2012-10-21 NOTE — Progress Notes (Signed)
[facility] Behavioral Health Follow-up Outpatient Visit  Albert [name] 11/27/1970  Date: 10/21/2012  History of Chief Complaint:  Albert [name] is a 42 y/o male with a past psychiatric history significant for Panic Disorder with Agoraphobia, Social Anxiety Disorder . The patient is referred for psychiatric services for medication management.   The patient reports that he is reports he has not been getting as much sleep. The patient reports he ran out of his medications but felt he needed to come in for an appointment rather than calling in .  He reports that he has less anxiety symptoms and is able to hold training lectures. He reports a decreased need for propranolol.  He feels he is "almost where he needs to be" with regards to anxiety but would like a trial increase of venlafaxine. The patient is otherwise taking his medications and denies any side effects.   In the area of affective symptoms, patient appears euthymic. Patient denies current suicidal ideation, intent, or plan. Patient denies current homicidal ideation, intent, or plan. Patient denies auditory hallucinations. Patient denies visual hallucinations. Patient denies symptoms of paranoia. Patient states sleep is good with approximately 6 hours of sleep per night, secondary to shift changes. Appetite is good. Energy level is good. Patient  endorsed symptoms of anhedonia. Patient denies hopelessness, helplessness, or guilt.   Denies any recent episodes consistent with mania, particularly decreased need for sleep with increased energy, grandiosity, impulsivity, hyperverbal and pressured speech, or increased productivity. Denies any recent symptoms consistent with psychosis, particularly auditory or visual hallucinations, thought broadcasting/insertion/withdrawal, or ideas of reference. 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 Denies any history of  trauma or symptoms consistent with PTSD such as flashbacks, nightmares, hypervigilance, feelings of numbness or inability to connect with others.    Review of Systems  Constitutional: Negative for fever, chills, diaphoresis, activity change, appetite change, fatigue and unexpected weight change.  Respiratory: Negative for apnea, cough, choking, chest tightness, shortness of breath, wheezing and stridor.  Cardiovascular: Negative for chest pain, palpitations and leg swelling.  Gastrointestinal: Negative for nausea, vomiting, abdominal pain, diarrhea, constipation, blood in stool, abdominal distention and anal bleeding.   Filed Vitals:   10/21/12 1500  BP: 119/91  Pulse: 83  Height: 5\' 11"  (1.803 m)  Weight: 261 lb (118.389 kg)    Physical Exam  Vitals reviewed.  Constitutional: He appears well-developed and well-nourished. No distress.  Skin: He is not diaphoretic.   Traumatic Brain Injury: No   Past Psychiatric History: Reviewed  Diagnosis:ADHD treated by Dr. Alphonzo Lemmings   Hospitalizations: Patient denies.   Outpatient Care: Patient was seeing patient up Until 3 years ago. Saw him for 4-5 years.   Substance Abuse Care: Patient denies.   Self-Mutilation: Patient denies.   Suicidal Attempts: Patient denies   Violent Behaviors: Patient denies.    Past Medical History: Reviewed  Past Medical History  Diagnosis Date  . Shoulder dislocation 2008    Left shoulder   History of Loss of Consciousness: No  Seizure History: No  Cardiac History: No  Allergies: No Known Allergies  Current Medications: Reviewed   Previous Psychotropic Medications: Reviewed  Medication   Adderall    Substance Abuse History in the last 12 months:  Caffeine: Coffee-10 cups. Tobacco: 1 PPD-25 years  Alcohol: 1 drink a year  Illicit: Patient denies.   Medical Consequences of Substance Abuse: N/A  Legal Consequences of  Substance Abuse: N/A  Family Consequences of Substance Abuse: N/A  Blackouts: No   DT's: No  Withdrawal Symptoms: No   Social History: Reviewed  Current Place of Residence: [place], [place]  Place of Birth: [place], [place]  Family Members: Youngest child of three, has an older and younger brother.  Marital Status: Single  Children: None  Relationships: The patient reports that he has no main source of emotional support.  Education: College-Did 2 years of community.  Educational Problems/Performance: Quit school to be close to his girlfriend.  Religious Beliefs/Practices: Not practicing.  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  Legal History: None  Hobbies/Interests: Watching sports   Family History: Reviewed  Family History  Problem Relation Age of Onset  . Hypertension Mother   . Diabetes type II Mother   . Hypertension Sister   . Hypertension Maternal Grandmother   . Diabetes type II Maternal Grandmother   . Breast cancer Paternal Grandfather   . Breast cancer Paternal Grandmother     Mental Status Examination/Evaluation:  Objective: Appearance: Casual and Fairly Groomed   Eye Contact:: Minimal   Speech: Clear and Coherent and Normal Rate   Volume: Normal   Mood: "pissy because of work"  6/10  Affect: Restricted   Thought Process: Coherent, Linear and Logical   Orientation: Full   Thought Content: WDL   Suicidal Thoughts: No   Homicidal Thoughts: No   Judgement: Good   Insight: Good   Psychomotor Activity: Normal   Akathisia: No   Handed: Right   Memory: Intact immediate and recent   Assets: Communication Skills  Desire for Improvement  Financial Resources/Insurance  Housing  Physical Health  Social Support  Talents/Skills  Transportation  Vocational/Educational    Laboratory/X-Ray  Psychological Evaluation(s)   None  None   Assessment:  AXIS I  Panic Disorder with Agoraphobia, Social Anxiety Disorder   AXIS II  Cluster C Traits   AXIS III  .   Shoulder dislocation  2008      Left shoulder   AXIS IV  occupational problems   AXIS V  51-60 moderate symptoms    Treatment Plan/Recommendations:  1. Affirm with the patient that the medications are taken as ordered. Patient expressed understanding of how their medications were to be used.  2. Continue the following psychiatric medications with the following changes:  A) Will increase Venlafaxine 150 mg + 37.5 mg  c) Propranolol 10 mg- one and a half to two tablet daily as needed for anxiety. Cautioned patient about side effects of the medication.  3. Therapy: brief supportive therapy provided. Continue current services. EMDR is an option for this option. 4. Risks and benefits, side effects and alternatives discussed with patient, he was given an opportunity to ask questions about his medication, illness, and treatment. All current psychiatric  medications have been reviewed and discussed with the patient and adjusted as clinically appropriate. The patient has been provided an accurate and updated list of the medications being now prescribed.  5. Patient told to call clinic if any problems occur. Patient advised to go to ER if he should develop SI/HI, side effects, or if symptoms worsen. Has crisis numbers to call if needed.  6. No labs warranted at this time.  7. The patient was encouraged to keep all PCP and specialty clinic appointments.  8. Patient was instructed to return to clinic in 2 months.  9. The patient was  advised to call and cancel their mental health appointment within 24 hours of the appointment, if they are unable to keep the appointment.  10. The patient expressed understanding of the plan and agrees with the above.   Jacqulyn Cane, M.D.  10/21/2012 2:57 PM

## 2013-01-20 ENCOUNTER — Ambulatory Visit (HOSPITAL_COMMUNITY): Payer: 59 | Admitting: Psychiatry

## 2013-03-31 ENCOUNTER — Other Ambulatory Visit (HOSPITAL_COMMUNITY): Payer: Self-pay | Admitting: Psychiatry

## 2013-03-31 DIAGNOSIS — F4010 Social phobia, unspecified: Secondary | ICD-10-CM

## 2013-03-31 MED ORDER — VENLAFAXINE HCL 37.5 MG PO TABS: 37.5000 mg | ORAL_TABLET | ORAL | Status: DC

## 2013-03-31 NOTE — Telephone Encounter (Signed)
Sent in refill

## 2014-01-09 ENCOUNTER — Encounter (INDEPENDENT_AMBULATORY_CARE_PROVIDER_SITE_OTHER): Payer: Self-pay

## 2014-01-09 ENCOUNTER — Ambulatory Visit (INDEPENDENT_AMBULATORY_CARE_PROVIDER_SITE_OTHER): Payer: 59 | Admitting: Physician Assistant

## 2014-01-09 ENCOUNTER — Encounter (HOSPITAL_COMMUNITY): Payer: Self-pay | Admitting: Physician Assistant

## 2014-01-09 VITALS — BP 114/77 | HR 85 | Ht 72.0 in | Wt 253.0 lb

## 2014-01-09 DIAGNOSIS — F4010 Social phobia, unspecified: Secondary | ICD-10-CM

## 2014-01-09 DIAGNOSIS — F17200 Nicotine dependence, unspecified, uncomplicated: Secondary | ICD-10-CM

## 2014-01-09 DIAGNOSIS — F411 Generalized anxiety disorder: Secondary | ICD-10-CM

## 2014-01-09 DIAGNOSIS — F4001 Agoraphobia with panic disorder: Secondary | ICD-10-CM

## 2014-01-09 MED ORDER — BUPROPION HCL ER (XL) 150 MG PO TB24: 150.0000 mg | ORAL_TABLET | Freq: Every day | ORAL | Status: DC

## 2014-01-09 MED ORDER — VENLAFAXINE HCL 37.5 MG PO TABS: 37.5000 mg | ORAL_TABLET | ORAL | Status: DC

## 2014-01-09 MED ORDER — HYDROXYZINE HCL 25 MG PO TABS: ORAL_TABLET | ORAL | Status: DC

## 2014-01-09 NOTE — Progress Notes (Signed)
   [facility]  Albert PersonRodney Holloman 08-Dec-1970  Date: 01/09/2014    Subjective: Patient is a pleasant 43 year old SWM who presents today for refill on his anxiety medication. He has been off of his medication for > 6 months at this time due to work and family obligations he was unable to keep his last appointment. He was last seen in 10/2012.      His biggest concern is that he has a wedding to attend in July and feels that he will not be able to get through the wedding without any medication at all. He has tried the propranolol in the past 10mg  and it did not seem to help. The venlafaxine did help a great deal.       He has been well and healthy in the interim and is hoping to stop smoking  He is asking about chantix as well.      He denies any panic attacks, but states he tends to avoid things that will cause him to be anxious.   Filed Vitals:   01/09/14 0921  BP: 114/77  Pulse: 85    Mental Status Examination  Appearance: WDWN WM NAD. Alert: Yes Attention: good  Cooperative: Yes Eye Contact: Good Speech:  Clear, funny, goal directed. Psychomotor Activity: Normal Memory/Concentration: 2/3 at 10 minutes Oriented: [name], place and time/date Mood: Anxious Affect: Congruent Thought Processes and Associations: Coherent, Goal Directed, Intact, Linear and Logical Fund of Knowledge: Good Thought Content: denies Insight: Good Judgement: Good  Diagnosis: Social anxiety disorder with                      Panic attack with agoraphobia                     Tobacco abuse.  Treatment Plan:  1. Medication management with Venlafaxine 37.5mg  po q AM. 2. Hydroxyzine 25mg  po q 6 hours prn anxiety and panic. 3. Wellbutrin XL 150mg  po q AM for smoking cessation. 4. Nicotine gum for smoking cessation. 5. Can use left over propranolol for panic as directed. 6. Schedule with Dahlia ClientHannah C. For DBT. 7. RTO 1 month. MASHBURN,NEIL, PA-C

## 2014-01-09 NOTE — Patient Instructions (Signed)
1. Take medication as directed. 2. Start Oral Nicotine gum for smoking cessation as directed. 3. Can start Propranolol 20mg  ( 2 tabs in AM.) and 2 more at wedding for panic. 4. Follow up in 3-4 weeks. 5. Schedule with [facility] for therapy.

## 2014-01-31 ENCOUNTER — Ambulatory Visit (HOSPITAL_COMMUNITY): Payer: 59 | Admitting: Professional Counselor

## 2014-02-06 ENCOUNTER — Ambulatory Visit (INDEPENDENT_AMBULATORY_CARE_PROVIDER_SITE_OTHER): Payer: 59 | Admitting: Physician Assistant

## 2014-02-06 ENCOUNTER — Encounter (HOSPITAL_COMMUNITY): Payer: Self-pay | Admitting: Physician Assistant

## 2014-02-06 VITALS — BP 132/81 | HR 85 | Ht 72.0 in | Wt 251.0 lb

## 2014-02-06 DIAGNOSIS — F4010 Social phobia, unspecified: Secondary | ICD-10-CM

## 2014-02-06 DIAGNOSIS — F4001 Agoraphobia with panic disorder: Secondary | ICD-10-CM

## 2014-02-06 DIAGNOSIS — F411 Generalized anxiety disorder: Secondary | ICD-10-CM

## 2014-02-06 MED ORDER — HYDROXYZINE HCL 25 MG PO TABS: ORAL_TABLET | ORAL | Status: DC

## 2014-02-06 MED ORDER — PROPRANOLOL HCL 20 MG PO TABS: ORAL_TABLET | ORAL | Status: DC

## 2014-02-06 MED ORDER — VENLAFAXINE HCL ER 75 MG PO CP24: 75.0000 mg | ORAL_CAPSULE | Freq: Every day | ORAL | Status: DC

## 2014-02-06 NOTE — Patient Instructions (Signed)
1. Schedule with Dahlia ClientHannah for ASAP. 2. Take your medications as directed. 3. Complete 2 homework sheets as noted. 4. Follow up in 3-4 weeks.
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
## 2014-03-13 ENCOUNTER — Ambulatory Visit (INDEPENDENT_AMBULATORY_CARE_PROVIDER_SITE_OTHER): Payer: 59 | Admitting: Physician Assistant

## 2014-03-13 ENCOUNTER — Encounter (INDEPENDENT_AMBULATORY_CARE_PROVIDER_SITE_OTHER): Payer: Self-pay

## 2014-03-13 ENCOUNTER — Encounter (HOSPITAL_COMMUNITY): Payer: Self-pay | Admitting: Physician Assistant

## 2014-03-13 VITALS — BP 128/80 | HR 77 | Ht 72.0 in | Wt 252.0 lb

## 2014-03-13 DIAGNOSIS — F4001 Agoraphobia with panic disorder: Secondary | ICD-10-CM

## 2014-03-13 DIAGNOSIS — F17200 Nicotine dependence, unspecified, uncomplicated: Secondary | ICD-10-CM

## 2014-03-13 DIAGNOSIS — F411 Generalized anxiety disorder: Secondary | ICD-10-CM

## 2014-03-13 DIAGNOSIS — F4010 Social phobia, unspecified: Secondary | ICD-10-CM

## 2014-03-13 MED ORDER — VENLAFAXINE HCL 100 MG PO TABS: ORAL_TABLET | ORAL | Status: DC

## 2014-03-13 MED ORDER — VARENICLINE TARTRATE 0.5 MG X 11 & 1 MG X 42 PO MISC: ORAL | Status: DC

## 2014-03-13 NOTE — Patient Instructions (Signed)
1. Take medications as directed. 2. Follow up 2-3 weeks. 3. Do CBT lesson as planned. 4. Call with problems.

## 2014-03-13 NOTE — Progress Notes (Signed)
  [facility] [id number] Progress Note  Albert [name] [id number] 43 y.o.  03/13/2014 8:25 AM  Chief Complaint: Social anxiety  History of Present Illness: Suicidal Ideation: No Plan Formed: No Patient has means to carry out plan: No  Homicidal Ideation: No Plan Formed: No Patient has means to carry out plan: No  Review of Systems: Psychiatric: Agitation: No Hallucination: No Depressed Mood: No Insomnia: No Hypersomnia: No Altered Concentration: No Feels Worthless: No Grandiose Ideas: No Belief In Special Powers: No New/Increased Substance Abuse: No Compulsions: No  Neurologic: Headache: No Seizure: No Paresthesias: No  Past Medical Family, Social History: No change  Outpatient Encounter Prescriptions as of 03/13/2014  Medication Sig  . hydrOXYzine (ATARAX/VISTARIL) 25 MG tablet Take oneto two tablet every 6 hours as needed for panic and anxiety.  Marland Kitchen ibuprofen (ADVIL,MOTRIN) 200 MG tablet Take 200 mg by mouth every 6 (six) hours as needed.  . propranolol (INDERAL) 20 MG tablet Take two tablets daily PRN anxiety.  Marland Kitchen venlafaxine XR (EFFEXOR XR) 75 MG 24 hr capsule Take 1 capsule (75 mg total) by mouth daily.    Past Psychiatric History/Hospitalization(s): Anxiety: Yes  2/10 Bipolar Disorder: No Depression: No Mania: No Psychosis: No Schizophrenia: No Personality Disorder: No Hospitalization for psychiatric illness: No History of Electroconvulsive Shock Therapy: No Prior Suicide Attempts: No  Physical Exam: Constitutional:  BP 128/80  Pulse 77  Ht 6' (1.829 m)  Wt 252 lb (114.306 kg)  BMI 34.17 kg/m2  General Appearance: alert, oriented, no acute distress  Musculoskeletal: Strength & Muscle Tone: within normal limits Gait & Station: normal Patient leans: N/A  Psychiatric: Speech (describe rate, volume, coherence, spontaneity, and abnormalities if any): normal  Thought Process (describe rate, content, abstract reasoning, and computation):  normal  Associations: Coherent and Relevant  Thoughts: normal  Mental Status: Orientation: oriented to place, time/date and situation Mood & Affect: anxiety Attention Span & Concentration: normal  Medical Decision Making (Choose Three): Established Problem, Stable/Improving (1)  Assessment: Axis I: GAD, Social Anxiety  Axis II: deferred  Axis III: none  Axis IV: social disfuntion Axis V: 65  Plan:  1. Increase Venlafaxine to  po qd. 2. Continue Cognitive BT training assignments as discussed.  Rona Ravens. [place] Oborn RPAC 8:56 AM 03/13/2014

## 2014-03-16 ENCOUNTER — Ambulatory Visit (HOSPITAL_COMMUNITY): Payer: 59 | Admitting: Professional Counselor

## 2014-04-03 ENCOUNTER — Ambulatory Visit (HOSPITAL_COMMUNITY): Payer: 59 | Admitting: Physician Assistant

## 2016-06-29 ENCOUNTER — Encounter: Payer: Self-pay | Admitting: Physician Assistant

## 2016-06-29 ENCOUNTER — Ambulatory Visit (INDEPENDENT_AMBULATORY_CARE_PROVIDER_SITE_OTHER): Payer: Self-pay | Admitting: Physician Assistant

## 2016-06-29 VITALS — BP 138/87 | HR 94 | Ht 72.0 in | Wt 235.0 lb

## 2016-06-29 DIAGNOSIS — F4010 Social phobia, unspecified: Secondary | ICD-10-CM

## 2016-06-29 DIAGNOSIS — G4700 Insomnia, unspecified: Secondary | ICD-10-CM

## 2016-06-29 HISTORY — DX: Insomnia, unspecified: G47.00

## 2016-06-29 MED ORDER — PAROXETINE HCL 20 MG PO TABS: 20.0000 mg | ORAL_TABLET | Freq: Every day | ORAL | 1 refills | Status: DC

## 2016-06-29 MED ORDER — HYDROXYZINE HCL 50 MG PO TABS: 50.0000 mg | ORAL_TABLET | Freq: Every evening | ORAL | 3 refills | Status: DC | PRN

## 2016-06-29 MED ORDER — PROPRANOLOL HCL 20 MG PO TABS: 20.0000 mg | ORAL_TABLET | Freq: Two times a day (BID) | ORAL | 1 refills | Status: DC | PRN

## 2016-06-29 NOTE — Patient Instructions (Signed)
Insomnia Insomnia is a sleep disorder that makes it difficult to fall asleep or to stay asleep. Insomnia can cause tiredness (fatigue), low energy, difficulty concentrating, mood swings, and poor performance at work or school. There are three different ways to classify insomnia:  Difficulty falling asleep.  Difficulty staying asleep.  Waking up too early in the morning. Any type of insomnia can be long-term (chronic) or short-term (acute). Both are common. Short-term insomnia usually lasts for three months or less. Chronic insomnia occurs at least three times a week for longer than three months. What are the causes? Insomnia may be caused by another condition, situation, or substance, such as:  Anxiety.  Certain medicines.  Gastroesophageal reflux disease (GERD) or other gastrointestinal conditions.  Asthma or other breathing conditions.  Restless legs syndrome, sleep apnea, or other sleep disorders.  Chronic pain.  Menopause. This may include hot flashes.  Stroke.  Abuse of alcohol, tobacco, or illegal drugs.  Depression.  Caffeine.  Neurological disorders, such as Alzheimer disease.  An overactive thyroid (hyperthyroidism). The cause of insomnia may not be known. What increases the risk? Risk factors for insomnia include:  Gender. Women are more commonly affected than men.  Age. Insomnia is more common as you get older.  Stress. This may involve your professional or personal life.  Income. Insomnia is more common in people with lower income.  Lack of exercise.  Irregular work schedule or night shifts.  Traveling between different time zones. What are the signs or symptoms? If you have insomnia, trouble falling asleep or trouble staying asleep is the main symptom. This may lead to other symptoms, such as:  Feeling fatigued.  Feeling nervous about going to sleep.  Not feeling rested in the morning.  Having trouble concentrating.  Feeling irritable,  anxious, or depressed. How is this treated? Treatment for insomnia depends on the cause. If your insomnia is caused by an underlying condition, treatment will focus on addressing the condition. Treatment may also include:  Medicines to help you sleep.  Counseling or therapy.  Lifestyle adjustments. Follow these instructions at home:  Take medicines only as directed by your health care provider.  Keep regular sleeping and waking hours. Avoid naps.  Keep a sleep diary to help you and your health care provider figure out what could be causing your insomnia. Include:  When you sleep.  When you wake up during the night.  How well you sleep.  How rested you feel the next day.  Any side effects of medicines you are taking.  What you eat and drink.  Make your bedroom a comfortable place where it is easy to fall asleep:  Put up shades or special blackout curtains to block light from outside.  Use a white noise machine to block noise.  Keep the temperature cool.  Exercise regularly as directed by your health care provider. Avoid exercising right before bedtime.  Use relaxation techniques to manage stress. Ask your health care provider to suggest some techniques that may work well for you. These may include:  Breathing exercises.  Routines to release muscle tension.  Visualizing peaceful scenes.  Cut back on alcohol, caffeinated beverages, and cigarettes, especially close to bedtime. These can disrupt your sleep.  Do not overeat or eat spicy foods right before bedtime. This can lead to digestive discomfort that can make it hard for you to sleep.  Limit screen use before bedtime. This includes:  Watching TV.  Using your smartphone, tablet, and computer.  Stick to a   routine. This can help you fall asleep faster. Try to do a quiet activity, brush your teeth, and go to bed at the same time each night.  Get out of bed if you are still awake after 15 minutes of trying to  sleep. Keep the lights down, but try reading or doing a quiet activity. When you feel sleepy, go back to bed.  Make sure that you drive carefully. Avoid driving if you feel very sleepy.  Keep all follow-up appointments as directed by your health care provider. This is important. Contact a health care provider if:  You are tired throughout the day or have trouble in your daily routine due to sleepiness.  You continue to have sleep problems or your sleep problems get worse. Get help right away if:  You have serious thoughts about hurting yourself or someone else. This information is not intended to replace advice given to you by your health care provider. Make sure you discuss any questions you have with your health care provider. Document Released: 06/26/2000 Document Revised: 11/29/2015 Document Reviewed: 03/30/2014 Elsevier Interactive Patient Education  2017 Elsevier Inc.  

## 2016-06-29 NOTE — Progress Notes (Signed)
Albert [name] is a 45 y.o. male who [facility] today for anxiety and to establish care.  Patient was diagnosed with social anxiety disorder and panic disorder with agoraphobia approx. 7 years ago. He was last seen by psychiatry on 03/2014 and was being managed with Effexor 100mg  qd, Propranolol 20mg  prn, and Vistaril 50mg  prn. States he has been on 4 medications and none of them helped. [facility] he has also been on Wellbutrin and Zoloft. He has not had his medications in a few months. Endorses worsening anxiety, racing thoughts and difficulty initiating and maintaining sleep. Denies alcohol or drug abuse. He requests a referral to Dr. Laury DeepPuthuvel.  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 States he avoids situations that could trigger a panic attack, such as calling in sick when there is a work presentation. Additionally, he has not been to the doctor's since 2009 due to anxiety.   He has an upcoming health at work appointment on January 15th in which he will have annual physical and preventive care labs completed.   Past Medical History:  Diagnosis Date  . ADHD   . Anxiety   . Depression   . Shoulder dislocation 2008   Left shoulder  . Social anxiety disorder    History reviewed. No pertinent surgical history. Social History  Substance Use Topics  . Smoking status: Former Smoker    Packs/day: 1.00    Years: 25.00    Types: Cigarettes    Quit date: 03/27/2016  . Smokeless tobacco: Not on file  . Alcohol use Yes     Comment: Drank 6-10 beers. Last drank during Christmas   family history includes Breast cancer in his paternal grandfather and paternal grandmother; Diabetes type II in his maternal grandmother and mother; Hypertension in his maternal grandmother, mother, and  sister.  ROS: negative except as noted in the HPI  Medications: Current Outpatient Prescriptions  Medication Sig Dispense Refill  . hydrOXYzine (ATARAX/VISTARIL) 50 MG tablet Take 1 tablet (50 mg total) by mouth at bedtime and may repeat dose one time if needed. For itching. 30 tablet 3  . PARoxetine (PAXIL) 20 MG tablet Take 1 tablet (20 mg total) by mouth daily. 30 tablet 1  . propranolol (INDERAL) 20 MG tablet Take 1 tablet (20 mg total) by mouth 2 (two) times daily as needed. 60 tablet 1   No current facility-administered medications for this visit.    No Known Allergies  Health Maintenance Health Maintenance  Topic Date Due  . HIV Screening  03/28/1987  . TETANUS/TDAP  03/28/1991  . INFLUENZA VACCINE  02/11/2016     Objective:  BP 138/87   Pulse 94   Ht 6' (1.829 m)   Wt 235 lb (106.6 kg)   BMI 31.87 kg/m  Gen: obese, not ill-appearing, no distress HEENT: Normocephalic, atraumatic.Extraocular movements intact Lungs: Normal work of breathing, speaking in full sentences Skin: no rashes or lesions on exposed skin Neuro: alert and oriented x 3, normal gait Psych: slightly anxious affect, pleasant mood, normal speech and thought content  Physical Exam GAD 7 : Generalized Anxiety Score 06/29/2016  Nervous, Anxious, on Edge 3  Control/stop worrying 2  Worry too much - different things 2  Trouble relaxing 2  Restless 0  Easily annoyed or irritable 1  Afraid - awful might happen 1  Total GAD 7 Score 11  I personally reviewed records from Behavioral Health   Assessment and Plan: 45 y.o. male with:   1. Social anxiety disorder, GAD score 11 - Patient inquired about a Xanax prescription. He has never been prescribed this medication before and I confirmed this with the [place] Controlled Substance Reporting System. I explained that I prefer he initiate this medication with his psychiatrist. -Started Paxil 20mg  - propranolol (INDERAL) 20 MG tablet; Take 1 tablet (20 mg  total) by mouth 2 (two) times daily as needed.  Dispense: 60 tablet; Refill: 1 - T[facility] - Ambulatory referral to Psychiatry  2. Insomnia, unspecified type - given handout on sleep hygeine - hydroxyzine 50mg    Patient declined screening labs today. [facility] Patient education and anticipatory guidance given Patient agrees with treatment plan Follow-up in 1 month or sooner as needed  Levonne Hubertharley E. Kason Benak PA-C

## 2016-06-30 LAB — TSH: TSH: 1.25 mIU/L (ref 0.40–4.50)

## 2016-07-27 ENCOUNTER — Ambulatory Visit: Payer: Self-pay | Admitting: Physician Assistant

## 2016-08-03 ENCOUNTER — Ambulatory Visit (INDEPENDENT_AMBULATORY_CARE_PROVIDER_SITE_OTHER): Payer: 59 | Admitting: Physician Assistant

## 2016-08-03 VITALS — BP 143/88 | HR 67 | Wt 238.0 lb

## 2016-08-03 DIAGNOSIS — F4010 Social phobia, unspecified: Secondary | ICD-10-CM

## 2016-08-03 DIAGNOSIS — N529 Male erectile dysfunction, unspecified: Secondary | ICD-10-CM

## 2016-08-03 DIAGNOSIS — R030 Elevated blood-pressure reading, without diagnosis of hypertension: Secondary | ICD-10-CM

## 2016-08-03 DIAGNOSIS — E119 Type 2 diabetes mellitus without complications: Secondary | ICD-10-CM | POA: Insufficient documentation

## 2016-08-03 DIAGNOSIS — N401 Enlarged prostate with lower urinary tract symptoms: Secondary | ICD-10-CM

## 2016-08-03 DIAGNOSIS — R6882 Decreased libido: Secondary | ICD-10-CM

## 2016-08-03 DIAGNOSIS — R351 Nocturia: Secondary | ICD-10-CM

## 2016-08-03 MED ORDER — METFORMIN HCL 500 MG PO TABS: 500.0000 mg | ORAL_TABLET | Freq: Two times a day (BID) | ORAL | 1 refills | Status: DC

## 2016-08-03 MED ORDER — TADALAFIL 5 MG PO TABS
5.0000 mg | ORAL_TABLET | Freq: Every day | ORAL | 11 refills | Status: DC
Start: 2016-08-03 — End: 2016-08-07

## 2016-08-03 NOTE — Progress Notes (Signed)
HPI:                                                                Albert PersonRodney [name] is a 46 y.o. male who [facility] today for anxiety follow-up  Patient started Paxil approximately 3 weeks ago. He feels that this has helped his anxiety, but he continues to have breakthrough anxiety related to public speaking at work. He has tried Hydroxyzine and states it makes him very tired. He states it helps to reduce palpitations and sweating, but he still has anxious thoughts. He has an appointment with outpatient psychiatry later this week.  Patient also reports he had screening labs at work and his A1C was 7.4 Scr 0.85 Lipids  TG 76  TC 172  HDL 34  LDL 121  Additionally, patient has concerns about erectile dysfunction and low libido. He states this has been a problem for over a year. Patient states he is sometimes able to achieve erection, and is unable to maintain an erection.  Health Maintenance Health Maintenance  Topic Date Due  . HIV Screening  03/28/1987  . TETANUS/TDAP  03/28/1991  . INFLUENZA VACCINE  02/11/2016    Past Medical History:  Diagnosis Date  . ADHD   . Anxiety   . Depression   . Shoulder dislocation 2008   Left shoulder  . Social anxiety disorder    No past surgical history on file. Social History  Substance Use Topics  . Smoking status: Former Smoker    Packs/day: 1.00    Years: 25.00    Types: Cigarettes    Quit date: 03/27/2016  . Smokeless tobacco: Not on file  . Alcohol use Yes     Comment: Drank 6-10 beers. Last drank during Christmas   family history includes Breast cancer in his paternal grandfather and paternal grandmother; Diabetes type II in his maternal grandmother and mother; Hypertension in his maternal grandmother, mother, and sister.  Review of Systems  Constitutional: Negative for chills, fever, malaise/fatigue and weight loss.  HENT: Negative.   Eyes: Positive for blurred  vision (far-sighted).  Respiratory: Negative.   Cardiovascular: Negative.   Genitourinary: Positive for frequency and urgency.       Nocturia  Musculoskeletal: Negative.   Skin: Negative.   Neurological: Negative.   Endo/Heme/Allergies: Positive for polydipsia.  Psychiatric/Behavioral: Negative for suicidal ideas. The patient is nervous/anxious.     Medications: Current Outpatient Prescriptions  Medication Sig Dispense Refill  . hydrOXYzine (ATARAX/VISTARIL) 50 MG tablet Take 1 tablet (50 mg total) by mouth at bedtime and may repeat dose one time if needed. For itching. 30 tablet 3  . PARoxetine (PAXIL) 20 MG tablet Take 1 tablet (20 mg total) by mouth daily. 30 tablet 1  . propranolol (INDERAL) 20 MG tablet Take 1 tablet (20 mg total) by mouth 2 (two) times daily as needed. 60 tablet 1   No current facility-administered medications for this visit.    No Known Allergies     Objective:  BP (!) 143/88   Pulse 67   Wt 238 lb (108 kg)   BMI 32.28 kg/m  Gen: well-groomed, cooperative, not ill-appearing, no distress Lungs: Normal work of breathing, speaking in full sentences Neuro: alert and oriented x  3, EOM's intact Psych: anxious affect, pleasant mood, normal speech and thought content  GAD 7 : Generalized Anxiety Score 08/03/2016 06/29/2016  Nervous, Anxious, on Edge 1 3  Control/stop worrying 1 2  Worry too much - different things 2 2  Trouble relaxing 2 2  Restless 0 0  Easily annoyed or irritable 0 1  Afraid - awful might happen 1 1  Total GAD 7 Score 7 11     Assessment and Plan: 46 y.o. male with   Type 2 diabetes mellitus without complication, without long-term current use of insulin (HCC) - metFORMIN (GLUCOPHAGE) 500 MG tablet; Take 1 tablet (500 mg total) by mouth 2 (two) times daily with a meal.  Dispense: 60 tablet; Refill: 1 - patient also has moderate BPH symptoms with AUASS of 12. Unclear if urinary symptoms will improve with treatment of diabetes.  Will defer treatment until DM is controlled  Social anxiety disorder - GAD7 score of 7 today, improved from 11 - cont Paxil daily - instructed to reduce Hydroxyzine to 25mg  - counseled patient that medications help with symptoms, but will not change thought patterns and he may benefit from cognitive behavioral therapy - follow-up with Psychiatry  Erectile dysfunction, unspecified erectile dysfunction type - IIEF-5 score of 5, severe - patient would like to trial Cialis - tadalafil (CIALIS) 5 MG tablet; Take 1 tablet (5 mg total) by mouth daily.  Dispense: 30 tablet; Refill: 11  Low libido - Testosterone Total,Free,Bio, Males  Elevated Blood Pressure Reading - patient with known social anxiety and agoraphobia. There is definitely a white coat syndrome contributing. Will monitor closely and engage patient in home BP monitoring at his next follow-up visit.  Patient education and anticipatory guidance given Patient agrees with treatment plan Follow-up in 3 months or sooner as needed  Levonne Hubert PA-C

## 2016-08-03 NOTE — Patient Instructions (Addendum)
Take Metformin twice daily with meals. You may experience nausea and diarrhea in the beginning. This should resolve within 1-2 weeks. Take '5mg'$  of Cialis 1-2 hours before sexual intercourse. You may increase to a maximum of '20mg'$  as needed. Alternatively you can take Cialis '5mg'$  daily.   Metformin tablets What is this medicine? METFORMIN (met FOR min) is used to treat type 2 diabetes. It helps to control blood sugar. Treatment is combined with diet and exercise. This medicine can be used alone or with other medicines for diabetes. This medicine may be used for other purposes; ask your health care provider or pharmacist if you have questions. COMMON BRAND NAME(S): Glucophage What should I tell my health care provider before I take this medicine? They need to know if you have any of these conditions: -anemia -frequently drink alcohol-containing beverages -become easily dehydrated -heart attack -heart failure that is treated with medications -kidney disease -liver disease -polycystic ovary syndrome -serious infection or injury -vomiting -an unusual or allergic reaction to metformin, other medicines, foods, dyes, or preservatives -pregnant or trying to get pregnant -breast-feeding How should I use this medicine? Take this medicine by mouth. Take it with meals. Swallow the tablets with a drink of water. Follow the directions on the prescription label. Take your medicine at regular intervals. Do not take your medicine more often than directed. Talk to your pediatrician regarding the use of this medicine in children. While this drug may be prescribed for children as young as 77 years of age for selected conditions, precautions do apply. Overdosage: If you think you have taken too much of this medicine contact a poison control center or emergency room at once. NOTE: This medicine is only for you. Do not share this medicine with others. What if I miss a dose? If you miss a dose, take it as soon as  you can. If it is almost time for your next dose, take only that dose. Do not take double or extra doses. What may interact with this medicine? Do not take this medicine with any of the following medications: -dofetilide -gatifloxacin -certain contrast medicines given before X-rays, CT scans, MRI, or other procedures This medicine may also interact with the following medications: -acetazolamide -certain medicines for HIV infection or hepatitis, like adefovir, emtricitabine, entecavir, lamivudine, or tenofovir -cimetidine -crizotinib -digoxin -diuretics -male hormones, like estrogens or progestins and birth control pills -glycopyrrolate -isoniazid -lamotrigine -medicines for blood pressure, heart disease, irregular heart beat -memantine -midodrine -methazolamide -morphine -nicotinic acid -phenothiazines like chlorpromazine, mesoridazine, prochlorperazine, thioridazine -phenytoin -procainamide -propantheline -quinidine -quinine -ranitidine -ranolazine -steroid medicines like prednisone or cortisone -stimulant medicines for attention disorders, weight loss, or to stay awake -thyroid medicines -topiramate -trimethoprim -trospium -vancomycin -vandetanib -zonisamide This list may not describe all possible interactions. Give your health care provider a list of all the medicines, herbs, non-prescription drugs, or dietary supplements you use. Also tell them if you smoke, drink alcohol, or use illegal drugs. Some items may interact with your medicine. What should I watch for while using this medicine? Visit your doctor or health care professional for regular checks on your progress. A test called the HbA1C (A1C) will be monitored. This is a simple blood test. It measures your blood sugar control over the last 2 to 3 months. You will receive this test every 3 to 6 months. Learn how to check your blood sugar. Learn the symptoms of low and high blood sugar and how to manage  them. Always carry a quick-source of sugar with  you in case you have symptoms of low blood sugar. Examples include hard sugar candy or glucose tablets. Make sure others know that you can choke if you eat or drink when you develop serious symptoms of low blood sugar, such as seizures or unconsciousness. They must get medical help at once. Tell your doctor or health care professional if you have high blood sugar. You might need to change the dose of your medicine. If you are sick or exercising more than usual, you might need to change the dose of your medicine. Do not skip meals. Ask your doctor or health care professional if you should avoid alcohol. Many nonprescription cough and cold products contain sugar or alcohol. These can affect blood sugar. This medicine may cause ovulation in premenopausal women who do not have regular monthly periods. This may increase your chances of becoming pregnant. You should not take this medicine if you become pregnant or think you may be pregnant. Talk with your doctor or health care professional about your birth control options while taking this medicine. Contact your doctor or health care professional right away if think you are pregnant. If you are going to need surgery, a MRI, CT scan, or other procedure, tell your doctor that you are taking this medicine. You may need to stop taking this medicine before the procedure. Wear a medical ID bracelet or chain, and carry a card that describes your disease and details of your medicine and dosage times. What side effects may I notice from receiving this medicine? Side effects that you should report to your doctor or health care professional as soon as possible: -allergic reactions like skin rash, itching or hives, swelling of the face, lips, or tongue -breathing problems -feeling faint or lightheaded, falls -muscle aches or pains -signs and symptoms of low blood sugar such as feeling anxious, confusion, dizziness,  increased hunger, unusually weak or tired, sweating, shakiness, cold, irritable, headache, blurred vision, fast heartbeat, loss of consciousness -slow or irregular heartbeat -unusual stomach pain or discomfort -unusually tired or weak Side effects that usually do not require medical attention (report to your doctor or health care professional if they continue or are bothersome): -diarrhea -headache -heartburn -metallic taste in mouth -nausea -stomach gas, upset This list may not describe all possible side effects. Call your doctor for medical advice about side effects. You may report side effects to FDA at 1-800-FDA-1088. Where should I keep my medicine? Keep out of the reach of children. Store at room temperature between 15 and 30 degrees C (59 and 86 degrees F). Protect from moisture and light. Throw away any unused medicine after the expiration date. NOTE: This sheet is a summary. It may not cover all possible information. If you have questions about this medicine, talk to your doctor, pharmacist, or health care provider.  2017 Elsevier/Gold Standard (2013-12-12 22:14:40)  Tadalafil tablets (Cialis) What is this medicine? TADALAFIL (tah DA la fil) is used to treat erection problems in men. It is also used for enlargement of the prostate gland in men, a condition called benign prostatic hyperplasia or BPH. This medicine improves urine flow and reduces BPH symptoms. This medicine can also treat both erection problems and BPH when they occur together. This medicine may be used for other purposes; ask your health care provider or pharmacist if you have questions. COMMON BRAND NAME(S): Cialis What should I tell my health care provider before I take this medicine? They need to know if you have any of these conditions: -bleeding  disorders -eye or vision problems, including a rare inherited eye disease called retinitis pigmentosa -anatomical deformation of the penis, Peyronie's disease, or  history of priapism (painful and prolonged erection) -heart disease, angina, a history of heart attack, irregular heart beats, or other heart problems -high or low blood pressure -history of blood diseases, like sickle cell anemia or leukemia -history of stomach bleeding -kidney disease -liver disease -stroke -an unusual or allergic reaction to tadalafil, other medicines, foods, dyes, or preservatives -pregnant or trying to get pregnant -breast-feeding How should I use this medicine? Take this medicine by mouth with a glass of water. Follow the directions on the prescription label. You may take this medicine with or without meals. When this medicine is used for erection problems, your doctor may prescribe it to be taken once daily or as needed. If you are taking the medicine as needed, you may be able to have sexual activity 30 minutes after taking it and for up to 36 hours after taking it. Whether you are taking the medicine as needed or once daily, you should not take more than one dose per day. If you are taking this medicine for symptoms of benign prostatic hyperplasia (BPH) or to treat both BPH and an erection problem, take the dose once daily at about the same time each day. Do not take your medicine more often than directed. Talk to your pediatrician regarding the use of this medicine in children. Special care may be needed. Overdosage: If you think you have taken too much of this medicine contact a poison control center or emergency room at once. NOTE: This medicine is only for you. Do not share this medicine with others. What if I miss a dose? If you are taking this medicine as needed for erection problems, this does not apply. If you miss a dose while taking this medicine once daily for an erection problem, benign prostatic hyperplasia, or both, take it as soon as you remember, but do not take more than one dose per day. What may interact with this medicine? Do not take this medicine  with any of the following medications: -nitrates like amyl nitrite, isosorbide dinitrate, isosorbide mononitrate, nitroglycerin -other medicines for erectile dysfunction like avanafil, sildenafil, vardenafil -other tadalafil products (Adcirca) -riociguat This medicine may also interact with the following medications: -certain drugs for high blood pressure -certain drugs for the treatment of HIV infection or AIDS -certain drugs used for fungal or yeast infections, like fluconazole, itraconazole, ketoconazole, and voriconazole -certain drugs used for seizures like carbamazepine, phenytoin, and phenobarbital -grapefruit juice -macrolide antibiotics like clarithromycin, erythromycin, troleandomycin -medicines for prostate problems -rifabutin, rifampin or rifapentine This list may not describe all possible interactions. Give your health care provider a list of all the medicines, herbs, non-prescription drugs, or dietary supplements you use. Also tell them if you smoke, drink alcohol, or use illegal drugs. Some items may interact with your medicine. What should I watch for while using this medicine? If you notice any changes in your vision while taking this drug, call your doctor or health care professional as soon as possible. Stop using this medicine and call your health care provider right away if you have a loss of sight in one or both eyes. Contact your doctor or health care professional right away if the erection lasts longer than 4 hours or if it becomes painful. This may be a sign of serious problem and must be treated right away to prevent permanent damage. If you experience symptoms of  nausea, dizziness, chest pain or arm pain upon initiation of sexual activity after taking this medicine, you should refrain from further activity and call your doctor or health care professional as soon as possible. Do not drink alcohol to excess (examples, 5 glasses of wine or 5 shots of whiskey) when taking  this medicine. When taken in excess, alcohol can increase your chances of getting a headache or getting dizzy, increasing your heart rate or lowering your blood pressure. Using this medicine does not protect you or your partner against HIV infection (the virus that causes AIDS) or other sexually transmitted diseases. What side effects may I notice from receiving this medicine? Side effects that you should report to your doctor or health care professional as soon as possible: -allergic reactions like skin rash, itching or hives, swelling of the face, lips, or tongue -breathing problems -changes in hearing -changes in vision -chest pain -fast, irregular heartbeat -prolonged or painful erection -seizures Side effects that usually do not require medical attention (report to your doctor or health care professional if they continue or are bothersome): -back pain -dizziness -flushing -headache -indigestion -muscle aches -nausea -stuffy or runny nose This list may not describe all possible side effects. Call your doctor for medical advice about side effects. You may report side effects to FDA at 1-800-FDA-1088. Where should I keep my medicine? Keep out of the reach of children. Store at room temperature between 15 and 30 degrees C (59 and 86 degrees F). Throw away any unused medicine after the expiration date. NOTE: This sheet is a summary. It may not cover all possible information. If you have questions about this medicine, talk to your doctor, pharmacist, or health care provider.  2017 Elsevier/Gold Standard (2013-11-17 13:15:49)

## 2016-08-04 ENCOUNTER — Encounter: Payer: Self-pay | Admitting: Physician Assistant

## 2016-08-04 DIAGNOSIS — R351 Nocturia: Secondary | ICD-10-CM

## 2016-08-04 DIAGNOSIS — N401 Enlarged prostate with lower urinary tract symptoms: Secondary | ICD-10-CM | POA: Insufficient documentation

## 2016-08-04 DIAGNOSIS — N529 Male erectile dysfunction, unspecified: Secondary | ICD-10-CM | POA: Insufficient documentation

## 2016-08-04 DIAGNOSIS — R6882 Decreased libido: Secondary | ICD-10-CM | POA: Insufficient documentation

## 2016-08-04 DIAGNOSIS — R030 Elevated blood-pressure reading, without diagnosis of hypertension: Secondary | ICD-10-CM | POA: Insufficient documentation

## 2016-08-04 HISTORY — DX: Decreased libido: R68.82

## 2016-08-04 HISTORY — DX: Male erectile dysfunction, unspecified: N52.9

## 2016-08-07 ENCOUNTER — Other Ambulatory Visit: Payer: Self-pay | Admitting: Physician Assistant

## 2016-08-07 ENCOUNTER — Ambulatory Visit (INDEPENDENT_AMBULATORY_CARE_PROVIDER_SITE_OTHER): Payer: 59 | Admitting: Psychiatry

## 2016-08-07 ENCOUNTER — Encounter (HOSPITAL_COMMUNITY): Payer: Self-pay | Admitting: Psychiatry

## 2016-08-07 VITALS — BP 130/84 | HR 80 | Ht 72.0 in | Wt 239.0 lb

## 2016-08-07 DIAGNOSIS — F4001 Agoraphobia with panic disorder: Secondary | ICD-10-CM | POA: Diagnosis not present

## 2016-08-07 DIAGNOSIS — Z833 Family history of diabetes mellitus: Secondary | ICD-10-CM

## 2016-08-07 DIAGNOSIS — Z79899 Other long term (current) drug therapy: Secondary | ICD-10-CM

## 2016-08-07 DIAGNOSIS — Z803 Family history of malignant neoplasm of breast: Secondary | ICD-10-CM

## 2016-08-07 DIAGNOSIS — F4010 Social phobia, unspecified: Secondary | ICD-10-CM

## 2016-08-07 DIAGNOSIS — Z87891 Personal history of nicotine dependence: Secondary | ICD-10-CM

## 2016-08-07 DIAGNOSIS — Z8249 Family history of ischemic heart disease and other diseases of the circulatory system: Secondary | ICD-10-CM

## 2016-08-07 DIAGNOSIS — Z818 Family history of other mental and behavioral disorders: Secondary | ICD-10-CM

## 2016-08-07 MED ORDER — SILDENAFIL CITRATE 50 MG PO TABS
ORAL_TABLET | ORAL | 1 refills | Status: DC
Start: 2016-08-07 — End: 2017-01-12

## 2016-08-07 MED ORDER — BUSPIRONE HCL 10 MG PO TABS: 10.0000 mg | ORAL_TABLET | Freq: Every day | ORAL | 0 refills | Status: DC | PRN

## 2016-08-07 MED ORDER — PAROXETINE HCL 20 MG PO TABS: 30.0000 mg | ORAL_TABLET | Freq: Every day | ORAL | 0 refills | Status: DC

## 2016-08-07 NOTE — Progress Notes (Signed)
Psychiatric Initial Adult Assessment   Patient Identification: Albert [name] MRN:  [id number] Date of Evaluation:  08/07/2016 Referral Source: Primary  care Chief Complaint:   Chief Complaint    Establish Care     Visit Diagnosis:    ICD-9-CM ICD-10-CM   1. 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 Social anxiety disorder 300.23 F40.10 PARoxetine (PAXIL) 20 MG tablet    History of Present Illness:  46 years old currently single Caucasian male living by himself he is currently working as a Insurance account manager position in [employer] referred by primary care for management of anxiety and social anxiety  States this since age 53  be suffering from anxiety including panic attack to begin with the apprehension of having a panic attack specialty related social and anxiety situations in which she has to meet people or be around people. Also endorses worries related to being around people and evening he has a new girlfriend and want to socialize because she wants to socialize towards 3 times a month but it is very difficult for him to socialize because he gets cautious and has social anxiety Paxil has helped some of the social anxiety including the panic attacks but overall he does feel very resistant to be around social situations Inderal helps the physical part and the palpitations but he still feels anxious when he is around people even at times with his parents with whom he does feel comfortable in general  Does not endorse psychotic symptoms, manic symptoms. Does not endorse hopelessness or despair on a day-to-day basis is that he is not hitting or has had episodes of depression  Aggravating factor: social situations Modifying factor: ok when home . Girlfriend is supportive  Associated Signs/Symptoms: Depression Symptoms:  anxiety, (Hypo) Manic Symptoms:  Distractibility, Anxiety Symptoms:  Excessive Worry, Psychotic Symptoms:  denies PTSD Symptoms: NA  Past Psychiatric History: socian  anxiety  Previous Psychotropic Medications: Yes   Substance Abuse History in the last 12 months:  No.  Consequences of Substance Abuse: NA  Past Medical History:  Past Medical History:  Diagnosis Date  . ADHD   . Anxiety   . Depression   . Shoulder dislocation 2008   Left shoulder  . Social anxiety disorder    History reviewed. No pertinent surgical history.  Family Psychiatric History: Brother has depression  Family History:  Family History  Problem Relation Age of Onset  . Hypertension Mother   . Diabetes type II Mother   . Hypertension Sister   . Hypertension Maternal Grandmother   . Diabetes type II Maternal Grandmother   . Breast cancer Paternal Grandfather   . Breast cancer Paternal Grandmother     Social History:   Social History   Social History  . Marital status: Unknown    Spouse name: N/A  . Number of children: N/A  . Years of education: N/A   Social History Main Topics  . Smoking status: Former Smoker    Packs/day: 1.00    Years: 25.00    Types: Cigarettes    Quit date: 03/27/2016  . Smokeless tobacco: Never Used  . Alcohol use No     Comment: Drank 6-10 beers. Last drank during Christmas  . Drug use: No  . Sexual activity: Yes    Partners: Female   Other Topics Concern  . None   Social History Narrative  . None    Additional Social History: Grew up with his parents and older brother and sister growing up was  okay says that he does not have any memory of bad memories but in general does not endorse having any significant concern about any traumatic event when growing up he did finish high school and has done some college he had enough friends and was active in some sports but overall says that his anxiety was not prominent to his late 40s  Allergies:  No Known Allergies  Metabolic Disorder Labs: No results found for: HGBA1C, MPG No results found for: PROLACTIN No results found for: CHOL, TRIG, HDL, CHOLHDL, VLDL, LDLCALC   Current  Medications: Current Outpatient Prescriptions  Medication Sig Dispense Refill  . metFORMIN (GLUCOPHAGE) 500 MG tablet Take 1 tablet (500 mg total) by mouth 2 (two) times daily with a meal. 60 tablet 1  . PARoxetine (PAXIL) 20 MG tablet Take 1.5 tablets (30 mg total) by mouth daily. 45 tablet 0  . propranolol (INDERAL) 20 MG tablet Take 1 tablet (20 mg total) by mouth 2 (two) times daily as needed. 60 tablet 1  . tadalafil (CIALIS) 5 MG tablet Take 1 tablet (5 mg total) by mouth daily. 30 tablet 11  . busPIRone (BUSPAR) 10 MG tablet Take 1 tablet (10 mg total) by mouth daily as needed. 30 tablet 0   No current facility-administered medications for this visit.     Neurologic: Headache: No Seizure: No Paresthesias:No  Musculoskeletal: Strength & Muscle Tone: within normal limits Gait & Station: normal Patient leans: no lean  Psychiatric Specialty Exam: Review of Systems  Cardiovascular: Negative for chest pain.  Skin: Negative for rash.  Psychiatric/Behavioral: Negative for hallucinations and suicidal ideas. The patient is nervous/anxious.     Blood pressure 130/84, pulse 80, height 6' (1.829 m), weight 239 lb (108.4 kg), SpO2 96 %.Body mass index is 32.41 kg/m.  General Appearance: Casual  Eye Contact:  Fair  Speech:  Normal Rate  Volume:  Normal  Mood:  Anxious  Affect:  Constricted  Thought Process:  Goal Directed  Orientation:  Full (Time, Place, and Person)  Thought Content:  Rumination  Suicidal Thoughts:  No  Homicidal Thoughts:  No  Memory:  Immediate;   Fair Recent;   Fair  Judgement:  Fair  Insight:  Fair  Psychomotor Activity:  Normal  Concentration:  Concentration: Fair and Attention Span: Fair  Recall:  [employer] of Knowledge:Fair  Language: Fair  Akathisia:  Negative  Handed:  Right  AIMS (if indicated):    Assets:  Desire for Improvement Social Support  ADL's:  Intact  Cognition: WNL  Sleep:  fair    Treatment Plan Summary: Medication  management and Plan as follows   Social anxiety: Continue Paxil 20 but increase it to 30mg  since it is still present and at times dysfunctional Performance anxiety and panic: Take Inderal he has 10-20 mg tablet once a day he takes it when necessary. Also Paxil for 30 mg  We will augment with buspirone 7.5 or 10 mg once a day when necessary  Patient was asking for Klonopin we discouraged that for now we'll increase the Paxil review symptomatology and dysfunctional in case needed we can consider low-dose benzodiazepine when necessary but he understands the risk and concerns with that meds  More than 50% time spent in counseling and coordination of care including patient education and review of side effects and concerns I do strongly recommend psychotherapy to work on coping skills and order to deal with the social anxiety as well follow-up in 3-4 weeks or earlier if needed  prescriptions sent   Thresa RossAKHTAR, Aws Shere, MD 1/26/20189:32 AM

## 2016-08-07 NOTE — Patient Instructions (Signed)
Recommend therapy  

## 2016-08-17 ENCOUNTER — Telehealth: Payer: Self-pay | Admitting: Physician Assistant

## 2016-08-17 NOTE — Telephone Encounter (Signed)
Called pt about flu shot left a vm on 08/17/16 @4 :15pm

## 2016-09-01 ENCOUNTER — Telehealth: Payer: Self-pay | Admitting: Physician Assistant

## 2016-09-01 ENCOUNTER — Encounter: Payer: Self-pay | Admitting: Physician Assistant

## 2016-09-01 ENCOUNTER — Telehealth: Payer: Self-pay

## 2016-09-01 DIAGNOSIS — F17200 Nicotine dependence, unspecified, uncomplicated: Secondary | ICD-10-CM | POA: Insufficient documentation

## 2016-09-01 MED ORDER — VARENICLINE TARTRATE 0.5 MG X 11 & 1 MG X 42 PO MISC: ORAL | 0 refills | Status: DC

## 2016-09-01 NOTE — Telephone Encounter (Signed)
Patient requests to start Chantix for smoking cessation. Prescription signed today. Needs one month follow-up appointment.

## 2016-09-01 NOTE — Telephone Encounter (Signed)
Pt reports that chantix is covered by insurance. Rx sent to [facility]

## 2016-09-04 ENCOUNTER — Other Ambulatory Visit (HOSPITAL_COMMUNITY): Payer: Self-pay | Admitting: Psychiatry

## 2016-09-04 DIAGNOSIS — F4010 Social phobia, unspecified: Secondary | ICD-10-CM

## 2016-09-07 NOTE — Telephone Encounter (Signed)
Medication refill- received fax from [facility] requesting a refill for Buspar and Paxil. Per Dr. Gilmore LarocheAkhtar, refill request is denied. Pt will need to schedule an apt. Lvm for pt to return call to office.

## 2016-09-29 ENCOUNTER — Telehealth: Payer: Self-pay | Admitting: Physician Assistant

## 2016-09-29 DIAGNOSIS — F17200 Nicotine dependence, unspecified, uncomplicated: Secondary | ICD-10-CM

## 2016-09-29 MED ORDER — VARENICLINE TARTRATE 1 MG PO TABS: 1.0000 mg | ORAL_TABLET | Freq: Two times a day (BID) | ORAL | 3 refills | Status: DC

## 2016-09-29 NOTE — Telephone Encounter (Signed)
Refilled Chantix. Patient has follow-up appointment on 10/08/2016

## 2016-10-08 ENCOUNTER — Encounter: Payer: Self-pay | Admitting: Physician Assistant

## 2016-10-09 ENCOUNTER — Other Ambulatory Visit: Payer: Self-pay | Admitting: Physician Assistant

## 2016-10-09 DIAGNOSIS — E119 Type 2 diabetes mellitus without complications: Secondary | ICD-10-CM

## 2016-10-16 ENCOUNTER — Other Ambulatory Visit (HOSPITAL_COMMUNITY): Payer: Self-pay | Admitting: Psychiatry

## 2016-10-16 DIAGNOSIS — F4010 Social phobia, unspecified: Secondary | ICD-10-CM

## 2016-10-19 NOTE — Telephone Encounter (Signed)
Received fax from [facility] requesting a refill for Paxil. Per Dr. Gilmore Laroche, refill request is denied. Pt will need to schedule an apt. Lvm for pt to contact office for an apt.

## 2016-10-20 ENCOUNTER — Other Ambulatory Visit: Payer: Self-pay

## 2016-10-20 ENCOUNTER — Telehealth: Payer: Self-pay

## 2016-10-20 DIAGNOSIS — F4010 Social phobia, unspecified: Secondary | ICD-10-CM

## 2016-10-20 MED ORDER — PAROXETINE HCL 20 MG PO TABS: 30.0000 mg | ORAL_TABLET | Freq: Every day | ORAL | 0 refills | Status: DC

## 2016-10-20 NOTE — Telephone Encounter (Signed)
Spoke with pt and he states that he is wanting his anxiety to be managed by PCP.  I notified pt that a 30 day supply of Paxil will go to pharmacy today but he needs to make f/u appointment to receive future refills. Pt verbalized understanding and was transferred to the front desk. -EH/RMA

## 2016-10-20 NOTE — Telephone Encounter (Signed)
Pt is requesting a refill of Paxil.  I called down to behavioral health they are refusing to refill the med because he is due for an appointment.  He is also due for a f/u appointment with you next week.  Is this refill request appropriate? Please advise. -EH/RMA

## 2016-10-20 NOTE — Telephone Encounter (Signed)
Find out if [place] plans to follow with [facility] for his anxiety or follow with [place]. If [place], then okay to refill.

## 2016-11-06 ENCOUNTER — Encounter: Payer: 59 | Admitting: Physician Assistant

## 2016-11-25 ENCOUNTER — Other Ambulatory Visit: Payer: Self-pay | Admitting: Physician Assistant

## 2016-11-25 DIAGNOSIS — E119 Type 2 diabetes mellitus without complications: Secondary | ICD-10-CM

## 2016-12-24 ENCOUNTER — Telehealth: Payer: Self-pay

## 2016-12-24 NOTE — Telephone Encounter (Signed)
Left recommendations of vm -EH/RMA

## 2016-12-24 NOTE — Telephone Encounter (Signed)
-----   Message from [facility] Elizabeth Cummings, [place]-C sent at 12/23/2016  4:51 PM EDT ----- For diabetes

## 2017-01-12 ENCOUNTER — Ambulatory Visit (INDEPENDENT_AMBULATORY_CARE_PROVIDER_SITE_OTHER): Payer: Managed Care, Other (non HMO) | Admitting: Physician Assistant

## 2017-01-12 ENCOUNTER — Encounter: Payer: Self-pay | Admitting: Physician Assistant

## 2017-01-12 VITALS — BP 143/84 | HR 101 | Ht 77.0 in | Wt 244.4 lb

## 2017-01-12 DIAGNOSIS — R6882 Decreased libido: Secondary | ICD-10-CM | POA: Diagnosis not present

## 2017-01-12 DIAGNOSIS — E1169 Type 2 diabetes mellitus with other specified complication: Secondary | ICD-10-CM

## 2017-01-12 DIAGNOSIS — E1129 Type 2 diabetes mellitus with other diabetic kidney complication: Secondary | ICD-10-CM

## 2017-01-12 DIAGNOSIS — F4010 Social phobia, unspecified: Secondary | ICD-10-CM

## 2017-01-12 DIAGNOSIS — R509 Fever, unspecified: Secondary | ICD-10-CM

## 2017-01-12 DIAGNOSIS — R809 Proteinuria, unspecified: Secondary | ICD-10-CM | POA: Diagnosis not present

## 2017-01-12 DIAGNOSIS — E785 Hyperlipidemia, unspecified: Secondary | ICD-10-CM | POA: Diagnosis not present

## 2017-01-12 DIAGNOSIS — IMO0002 Reserved for concepts with insufficient information to code with codable children: Secondary | ICD-10-CM

## 2017-01-12 DIAGNOSIS — E1165 Type 2 diabetes mellitus with hyperglycemia: Secondary | ICD-10-CM | POA: Diagnosis not present

## 2017-01-12 LAB — POCT UA - MICROALBUMIN
Creatinine, POC: 100 mg/dL
MICROALBUMIN (UR) POC: 150 mg/L

## 2017-01-12 LAB — POCT GLYCOSYLATED HEMOGLOBIN (HGB A1C): Hemoglobin A1C: 9.9

## 2017-01-12 MED ORDER — DAPAGLIFLOZIN PRO-METFORMIN ER 5-1000 MG PO TB24: 5.0000 mg | ORAL_TABLET | Freq: Two times a day (BID) | ORAL | 0 refills | Status: DC

## 2017-01-12 MED ORDER — LISINOPRIL 10 MG PO TABS: 10.0000 mg | ORAL_TABLET | Freq: Every day | ORAL | 3 refills | Status: DC

## 2017-01-12 MED ORDER — ASPIRIN EC 81 MG PO TBEC: 81.0000 mg | DELAYED_RELEASE_TABLET | Freq: Every day | ORAL | 3 refills | Status: DC

## 2017-01-12 MED ORDER — LORAZEPAM 0.5 MG PO TABS: 0.5000 mg | ORAL_TABLET | Freq: Two times a day (BID) | ORAL | 2 refills | Status: DC | PRN

## 2017-01-12 MED ORDER — ESCITALOPRAM OXALATE 20 MG PO TABS: ORAL_TABLET | ORAL | 1 refills | Status: DC

## 2017-01-12 NOTE — Progress Notes (Signed)
HPI:                                                                Albert [name] is a 46 y.o. male who [facility] today for fever  Patient reports intermittent fevers since Thursday. Reports highest reading of 101 on Sunday. He has been taking Ibuprofen. Endorses myalgias and anorexia. Denies cough, congestion, ear pain, nausea, abdominal pain, diarrhea.   DMII: prescribed Metformin 1000mg  bid. Not compliant with medications because patient does not attend follow-up appointments due to severe social anxiety. Last A1C 7.4 (08/03/16). Denies polyuria, vision change, and paresthesias. Denies hypoglycemic events. Denies ulcers/wounds on feet. Eye exam: due Foot exam: due    Past Medical History:  Diagnosis Date  . ADHD   . Anxiety   . Depression   . Diabetes mellitus without complication (HCC)   . Shoulder dislocation 2008   Left shoulder  . Social anxiety disorder   . Tobacco use disorder    No past surgical history on file. Social History  Substance Use Topics  . Smoking status: Current Every Day Smoker    Packs/day: 1.00    Years: 25.00    Types: Cigarettes    Last attempt to quit: 03/27/2016  . Smokeless tobacco: Never Used  . Alcohol use No     Comment: Drank 6-10 beers. Last drank during Christmas   family history includes Breast cancer in his paternal grandfather and paternal grandmother; Diabetes type II in his maternal grandmother and mother; Hypertension in his maternal grandmother, mother, and sister.  ROS: Review of Systems  Constitutional: Positive for chills and fever. Negative for diaphoresis, malaise/fatigue and weight loss.  HENT: Negative.   Eyes: Negative.   Respiratory: Negative.   Cardiovascular: Negative.   Gastrointestinal: Negative.   Genitourinary:       + erectile dysfunction  Musculoskeletal: Positive for myalgias.  Skin: Negative.   Neurological: Negative.  Negative for weakness.   Endo/Heme/Allergies:       + low libido  Psychiatric/Behavioral: Negative for substance abuse. The patient is nervous/anxious.      Medications: Current Outpatient Prescriptions  Medication Sig Dispense Refill  . aspirin EC 81 MG tablet Take 1 tablet (81 mg total) by mouth daily. 90 tablet 3  . Dapagliflozin-Metformin HCl ER (XIGDUO XR) 11-998 MG TB24 Take 5-1,000 mg by mouth 2 (two) times daily with a meal. 180 tablet 0  . escitalopram (LEXAPRO) 20 MG tablet One half tab daily for a week then one tab by mouth daily 30 tablet 1  . lisinopril (PRINIVIL,ZESTRIL) 10 MG tablet Take 1 tablet (10 mg total) by mouth daily. 30 tablet 3  . LORazepam (ATIVAN) 0.5 MG tablet Take 1 tablet (0.5 mg total) by mouth 2 (two) times daily as needed for anxiety. 30 tablet 2  . varenicline (CHANTIX) 1 MG tablet Take 1 tablet (1 mg total) by mouth 2 (two) times daily. (Patient not taking: Reported on 01/12/2017) 60 tablet 3   No current facility-administered medications for this visit.    No Known Allergies     Objective:  BP (!) 143/84   Pulse (!) 101   Ht 6\' 5"  (1.956 m)   Wt 244 lb 6.4 oz (110.9 kg)  SpO2 99%   BMI 28.98 kg/m  Gen: well-groomed, cooperative, not ill-appearing, no distress HEENT: normal conjunctiva, TM's clear, oropharynx clear, moist mucus membranes, no frontal or maxillary sinus tenderness, neck supple, trachea midline Pulm: Normal work of breathing, normal phonation, clear to auscultation bilaterally, no wheezes, rales or rhonchi CV: Tachycardic, regular rhythm, s1 and s2 distinct, no murmurs, clicks or rubs  GI: abdomen soft, nondistended, nontender Neuro: alert and oriented x 3, EOM's intact, no tremor MSK: moving all extremities, normal gait and station, no peripheral edema Lymph: no cervical or tonsillar adenopathy Skin: warm, dry, intact; no rashes or lesions on exposed skin, no cyanosis   Results for orders placed or performed in visit on 01/12/17 (from the past 72  hour(s))  POCT UA - Microalbumin     Status: None   Collection Time: 01/12/17  3:17 PM  Result Value Ref Range   Microalbumin Ur, POC 150 mg/L   Creatinine, POC 100 mg/dL   Albumin/Creatinine Ratio, Urine, POC 30-300   POCT HgB A1C     Status: None   Collection Time: 01/12/17  3:18 PM  Result Value Ref Range   Hemoglobin A1C 9.9    No results found.  Diabetic Foot Exam - Simple   Simple Foot Form Diabetic Foot exam was performed with the following findings:  Yes 01/12/2017  3:12 PM  Visual Inspection No deformities, no ulcerations, no other skin breakdown bilaterally:  Yes Sensation Testing Intact to touch and monofilament testing bilaterally:  Yes Pulse Check Comments      Assessment and Plan: 46 y.o. male with   1. Uncontrolled type 2 diabetes mellitus with microalbuminuria, without long-term current use of insulin (HCC) - POCT HgB A1C 9.9, worsened from 6 months ago - starting Xigduo bid - foot exam performed today - POCT UA showed microalbuminuria. Started on ACE - baby aspirin daily - checking lipid panel to dose statin - patient will not do well with diabetes education classes due to severe social anxiety. Educated to limit carbohydrates and simple sugars - CBC - COMPLETE METABOLIC PANEL WITH GFR - Lipid Panel w/reflex Direct LDL - VITAMIN D 25 Hydroxy (Vit-D Deficiency, Fractures) - lisinopril (PRINIVIL,ZESTRIL) 10 MG tablet; Take 1 tablet (10 mg total) by mouth daily.  Dispense: 30 tablet; Refill: 3 - Dapagliflozin-Metformin HCl ER (XIGDUO XR) 11-998 MG TB24; Take 5-1,000 mg by mouth 2 (two) times daily with a meal.  Dispense: 180 tablet; Refill: 0   2. Social anxiety disorder - switching from Paxil to Lexapro. Patient has not taken his Paxil in weeks, so no need to taper - Escitalopram 10mg  for 1 week, then 20 mg daily - Lorazepam 0.5mg  as needed for breakthrough anxiety/panic attacks - follow-up in 4 weeks  3. Low libido - Testosterone Total,Free,Bio,  Males  4. Fever and chills - reassuring physical exam. Likely viral syndrome. - continue symptomatic management with NSAID's/Tylenol - follow-up for fever >101 or symptoms persisting  Patient education and anticipatory guidance given Patient agrees with treatment plan Follow-up as needed if symptoms worsen or fail to improve    I spent 40 minutes with this patient, greater than 50% was face-to-face time counseling regarding the above diagnoses  Levonne Hubert PA-C

## 2017-01-12 NOTE — Patient Instructions (Addendum)
Return for fasting labs between 7:30 and 8:30 am  For your anxiety: - Start escitalopram 1/2 tab daily for 1 week. Then increase to 1 tablet every morning - Take Lorazepam 1 tablet as needed for breakthrough anxiety/panic attacks. Take up to twice a day. Follow-up in 4 weeks  - Take Chantix at the therapeutic dose (no need to repeat starter pack) and actively work to stop smoking. You should only smoke for 7 days while taking Chantix  For your diabetes: - Start Xigduo with meals twice a day - Start Lisinopril 10mg  daily for your kidneys and blood pressure - Take a baby aspirin (81mg ) daily for primary prevention of heart disease - Plan to start a medication called Atorvastatin to lower cholesterol and prevent heart disease. We need your blood work to decide the dose. - Limit carbohydrates and simple sugars - Return in 3 months   Carbohydrate Counting for Diabetes Mellitus, Adult Carbohydrate counting is a method for keeping track of how many carbohydrates you eat. Eating carbohydrates naturally increases the amount of sugar (glucose) in the blood. Counting how many carbohydrates you eat helps keep your blood glucose within normal limits, which helps you manage your diabetes (diabetes mellitus). It is important to know how many carbohydrates you can safely have in each meal. This is different for every person. A diet and nutrition specialist (registered dietitian) can help you make a meal plan and calculate how many carbohydrates you should have at each meal and snack. Carbohydrates are found in the following foods:  Grains, such as breads and cereals.  Dried beans and soy products.  Starchy vegetables, such as potatoes, peas, and corn.  Fruit and fruit juices.  Milk and yogurt.  Sweets and snack foods, such as cake, cookies, candy, chips, and soft drinks.  How do I count carbohydrates? There are two ways to count carbohydrates in food. You can use either of the methods or a  combination of both. Reading "Nutrition Facts" on packaged food The "Nutrition Facts" list is included on the labels of almost all packaged foods and beverages in the [place] It includes:  The serving size.  Information about nutrients in each serving, including the grams (g) of carbohydrate per serving.  To use the "Nutrition Facts":  Decide how many servings you will have.  Multiply the number of servings by the number of carbohydrates per serving.  The resulting number is the total amount of carbohydrates that you will be having.  Learning standard serving sizes of other foods When you eat foods containing carbohydrates that are not packaged or do not include "Nutrition Facts" on the label, you need to measure the servings in order to count the amount of carbohydrates:  Measure the foods that you will eat with a food scale or measuring cup, if needed.  Decide how many standard-size servings you will eat.  Multiply the number of servings by 15. Most carbohydrate-rich foods have about 15 g of carbohydrates per serving. ? For example, if you eat 8 oz (170 g) of strawberries, you will have eaten 2 servings and 30 g of carbohydrates (2 servings x 15 g = 30 g).  For foods that have more than one food mixed, such as soups and casseroles, you must count the carbohydrates in each food that is included.  The following list contains standard serving sizes of common carbohydrate-rich foods. Each of these servings has about 15 g of carbohydrates:   hamburger bun or  English muffin.   oz (15  mL) syrup.   oz (14 g) jelly.  1 slice of bread.  1 six-inch tortilla.  3 oz (85 g) cooked rice or pasta.  4 oz (113 g) cooked dried beans.  4 oz (113 g) starchy vegetable, such as peas, corn, or potatoes.  4 oz (113 g) hot cereal.  4 oz (113 g) mashed potatoes or  of a large baked potato.  4 oz (113 g) canned or frozen fruit.  4 oz (120 mL) fruit juice.  4-6 crackers.  6 chicken  nuggets.  6 oz (170 g) unsweetened dry cereal.  6 oz (170 g) plain fat-free yogurt or yogurt sweetened with artificial sweeteners.  8 oz (240 mL) milk.  8 oz (170 g) fresh fruit or one small piece of fruit.  24 oz (680 g) popped popcorn.  Example of carbohydrate counting Sample meal  3 oz (85 g) chicken breast.  6 oz (170 g) brown rice.  4 oz (113 g) corn.  8 oz (240 mL) milk.  8 oz (170 g) strawberries with sugar-free whipped topping. Carbohydrate calculation 1. Identify the foods that contain carbohydrates: ? Rice. ? Corn. ? Milk. ? Strawberries. 2. Calculate how many servings you have of each food: ? 2 servings rice. ? 1 serving corn. ? 1 serving milk. ? 1 serving strawberries. 3. Multiply each number of servings by 15 g: ? 2 servings rice x 15 g = 30 g. ? 1 serving corn x 15 g = 15 g. ? 1 serving milk x 15 g = 15 g. ? 1 serving strawberries x 15 g = 15 g. 4. Add together all of the amounts to find the total grams of carbohydrates eaten: ? 30 g + 15 g + 15 g + 15 g = 75 g of carbohydrates total. This information is not intended to replace advice given to you by your health care provider. Make sure you discuss any questions you have with your health care provider. Document Released: 06/29/2005 Document Revised: 01/17/2016 Document Reviewed: 12/11/2015 Elsevier Interactive Patient Education  [employer]2018 [employer]

## 2017-01-19 LAB — CBC
HEMATOCRIT: 44.7 % (ref 38.5–50.0)
Hemoglobin: 15.1 g/dL (ref 13.2–17.1)
MCH: 31.5 pg (ref 27.0–33.0)
MCHC: 33.8 g/dL (ref 32.0–36.0)
MCV: 93.1 fL (ref 80.0–100.0)
MPV: 9.6 fL (ref 7.5–12.5)
Platelets: 390 10*3/uL (ref 140–400)
RBC: 4.8 MIL/uL (ref 4.20–5.80)
RDW: 13.1 % (ref 11.0–15.0)
WBC: 11.4 10*3/uL — AB (ref 3.8–10.8)

## 2017-01-20 LAB — TESTOSTERONE TOTAL,FREE,BIO, MALES
ALBUMIN: 4.3 g/dL (ref 3.6–5.1)
Sex Hormone Binding: 20 nmol/L (ref 10–50)
Testosterone, Bioavailable: 127.1 ng/dL (ref 110.0–575.0)
Testosterone, Free: 64.5 pg/mL (ref 46.0–224.0)
Testosterone: 338 ng/dL (ref 250–827)

## 2017-01-20 LAB — COMPLETE METABOLIC PANEL WITH GFR
ALT: 23 U/L (ref 9–46)
AST: 15 U/L (ref 10–40)
Albumin: 4.3 g/dL (ref 3.6–5.1)
Alkaline Phosphatase: 104 U/L (ref 40–115)
BUN: 20 mg/dL (ref 7–25)
CALCIUM: 9.5 mg/dL (ref 8.6–10.3)
CHLORIDE: 102 mmol/L (ref 98–110)
CO2: 21 mmol/L (ref 20–31)
Creat: 0.81 mg/dL (ref 0.60–1.35)
GFR, Est African American: >89 mL/min (ref >=60)
GFR, Est Non African American: >89 mL/min (ref >=60)
Glucose, Bld: 143 mg/dL — ABNORMAL HIGH (ref 65–99)
POTASSIUM: 4.3 mmol/L (ref 3.5–5.3)
SODIUM: 136 mmol/L (ref 135–146)
Total Bilirubin: 0.6 mg/dL (ref 0.2–1.2)
Total Protein: 6.9 g/dL (ref 6.1–8.1)

## 2017-01-20 LAB — LIPID PANEL W/REFLEX DIRECT LDL
CHOL/HDL RATIO: 5.3 ratio — AB (ref <5.0)
CHOLESTEROL: 163 mg/dL (ref <200)
HDL: 31 mg/dL — ABNORMAL LOW (ref >40)
LDL-Cholesterol: 98 mg/dL
NON-HDL CHOLESTEROL (CALC): 132 mg/dL — AB (ref <130)
Triglycerides: 218 mg/dL — ABNORMAL HIGH (ref <150)

## 2017-01-20 LAB — VITAMIN D 25 HYDROXY (VIT D DEFICIENCY, FRACTURES): Vit D, 25-Hydroxy: 20 ng/mL — ABNORMAL LOW (ref 30–100)

## 2017-01-21 DIAGNOSIS — E1169 Type 2 diabetes mellitus with other specified complication: Secondary | ICD-10-CM

## 2017-01-21 DIAGNOSIS — E785 Hyperlipidemia, unspecified: Secondary | ICD-10-CM | POA: Insufficient documentation

## 2017-01-21 HISTORY — DX: Type 2 diabetes mellitus with other specified complication: E11.69

## 2017-01-21 HISTORY — DX: Hyperlipidemia, unspecified: E78.5

## 2017-01-21 MED ORDER — ATORVASTATIN CALCIUM 10 MG PO TABS: 10.0000 mg | ORAL_TABLET | Freq: Every day | ORAL | 3 refills | Status: DC

## 2017-01-21 MED ORDER — AMBULATORY NON FORMULARY MEDICATION: 0 refills | Status: DC

## 2017-01-21 NOTE — Progress Notes (Signed)
1. LDL cholesterol is 98. For diabetes, LDL goal is less than 70. Sending prescription for Atorvastatin 10mg  daily 2. Vitamin D is low. Recommend daily over-the-counter vitamin D supplement 5000 units daily 3. Mildly elevated white blood cell count. This could explain the fever. Likely was fighting off a viral infection. Follow-up if still experiencing fever

## 2017-01-21 NOTE — Addendum Note (Signed)
Addended by: Gena FrayUMMINGS, CHARLEY E on: 01/21/2017 04:55 PM   Modules accepted: Orders

## 2017-01-21 NOTE — Addendum Note (Signed)
Addended by: Gena FrayUMMINGS, CHARLEY E on: 01/21/2017 10:34 AM   Modules accepted: Orders

## 2017-02-18 ENCOUNTER — Encounter: Payer: Self-pay | Admitting: Physician Assistant

## 2017-02-18 ENCOUNTER — Ambulatory Visit (INDEPENDENT_AMBULATORY_CARE_PROVIDER_SITE_OTHER): Payer: Managed Care, Other (non HMO) | Admitting: Physician Assistant

## 2017-02-18 VITALS — BP 116/76 | HR 75 | Temp 98.0°F | Ht 72.0 in | Wt 239.1 lb

## 2017-02-18 DIAGNOSIS — Z0000 Encounter for general adult medical examination without abnormal findings: Secondary | ICD-10-CM

## 2017-02-18 DIAGNOSIS — L84 Corns and callosities: Secondary | ICD-10-CM

## 2017-02-18 DIAGNOSIS — Z23 Encounter for immunization: Secondary | ICD-10-CM | POA: Diagnosis not present

## 2017-02-18 DIAGNOSIS — R6882 Decreased libido: Secondary | ICD-10-CM

## 2017-02-18 DIAGNOSIS — Z135 Encounter for screening for eye and ear disorders: Secondary | ICD-10-CM | POA: Diagnosis not present

## 2017-02-18 MED ORDER — PNEUMOCOCCAL VAC POLYVALENT 25 MCG/0.5ML IJ INJ
0.5000 mL | INJECTION | Freq: Once | INTRAMUSCULAR | Status: AC
  Administered 2017-02-18: 0.5 mL via INTRAMUSCULAR

## 2017-02-18 MED ORDER — PNEUMOCOCCAL 13-VAL CONJ VACC IM SUSP: 0.5000 mL | Freq: Once | INTRAMUSCULAR | Status: DC

## 2017-02-18 MED ORDER — PNEUMOCOCCAL VAC POLYVALENT 25 MCG/0.5ML IJ INJ: 0.5000 mL | INJECTION | INTRAMUSCULAR | Status: DC

## 2017-02-18 NOTE — Patient Instructions (Signed)
Testosterone injection What is this medicine? TESTOSTERONE (tes TOS ter one) is the main male hormone. It supports normal male development such as muscle growth, facial hair, and deep voice. It is used in males to treat low testosterone levels. This medicine may be used for other purposes; ask your health care provider or pharmacist if you have questions. COMMON BRAND NAME(S): Andro-L.A., Aveed, Delatestryl, Depo-Testosterone, Virilon What should I tell my health care provider before I take this medicine? They need to know if you have any of these conditions: -cancer -diabetes -heart disease -kidney disease -liver disease -lung disease -prostate disease -an unusual or allergic reaction to testosterone, other medicines, foods, dyes, or preservatives -pregnant or trying to get pregnant -breast-feeding How should I use this medicine? This medicine is for injection into a muscle. It is usually given by a health care professional in a hospital or clinic setting. Contact your pediatrician regarding the use of this medicine in children. While this medicine may be prescribed for children as young as 12 years of age for selected conditions, precautions do apply. Overdosage: If you think you have taken too much of this medicine contact a poison control center or emergency room at once. NOTE: This medicine is only for you. Do not share this medicine with others. What if I miss a dose? Try not to miss a dose. Your doctor or health care professional will tell you when your next injection is due. Notify the office if you are unable to keep an appointment. What may interact with this medicine? -medicines for diabetes -medicines that treat or prevent blood clots like warfarin -oxyphenbutazone -propranolol -steroid medicines like prednisone or cortisone This list may not describe all possible interactions. Give your health care provider a list of all the medicines, herbs, non-prescription drugs, or  dietary supplements you use. Also tell them if you smoke, drink alcohol, or use illegal drugs. Some items may interact with your medicine. What should I watch for while using this medicine? Visit your doctor or health care professional for regular checks on your progress. They will need to check the level of testosterone in your blood. This medicine is only approved for use in men who have low levels of testosterone related to certain medical conditions. Heart attacks and strokes have been reported with the use of this medicine. Notify your doctor or health care professional and seek emergency treatment if you develop breathing problems; changes in vision; confusion; chest pain or chest tightness; sudden arm pain; severe, sudden headache; trouble speaking or understanding; sudden numbness or weakness of the face, arm or leg; loss of balance or coordination. Talk to your doctor about the risks and benefits of this medicine. This medicine may affect blood sugar levels. If you have diabetes, check with your doctor or health care professional before you change your diet or the dose of your diabetic medicine. Testosterone injections are not commonly used in women. Women should inform their doctor if they wish to become pregnant or think they might be pregnant. There is a potential for serious side effects to an unborn child. Talk to your health care professional or pharmacist for more information. Talk with your doctor or health care professional about your birth control options while taking this medicine. This drug is banned from use in athletes by most athletic organizations. What side effects may I notice from receiving this medicine? Side effects that you should report to your doctor or health care professional as soon as possible: -allergic reactions like skin rash,   itching or hives, swelling of the face, lips, or tongue -breast enlargement -breathing problems -changes in emotions or moods -deep or  hoarse voice -irregular menstrual periods -signs and symptoms of liver injury like dark yellow or brown urine; general ill feeling or flu-like symptoms; light-colored stools; loss of appetite; nausea; right upper belly pain; unusually weak or tired; yellowing of the eyes or skin -stomach pain -swelling of the ankles, feet, hands -too frequent or persistent erections -trouble passing urine or change in the amount of urine Side effects that usually do not require medical attention (report to your doctor or health care professional if they continue or are bothersome): -acne -change in sex drive or performance -facial hair growth -hair loss -headache This list may not describe all possible side effects. Call your doctor for medical advice about side effects. You may report side effects to FDA at 1-800-FDA-1088. Where should I keep my medicine? Keep out of the reach of children. This medicine can be abused. Keep your medicine in a safe place to protect it from theft. Do not share this medicine with anyone. Selling or giving away this medicine is dangerous and against the law. Store at room temperature between 20 and 25 degrees C (68 and 77 degrees F). Do not freeze. Protect from light. Follow the directions for the product you are prescribed. Throw away any unused medicine after the expiration date. NOTE: This sheet is a summary. It may not cover all possible information. If you have questions about this medicine, talk to your doctor, pharmacist, or health care provider.  2018 Elsevier/Gold Standard (2015-08-03 07:33:55)  

## 2017-02-18 NOTE — Progress Notes (Signed)
HPI:                                                                Albert [name] is a 46 y.o. male who presents to [place]: [place] today for annual physical exam  Current Concerns include bilateral foot pain, low libido  Patient reports callus on plantar aspect of bilateral feet that causes tenderness of the foot pad just below his 5th toe bilaterally after standing for long periods. He has tried various OTC insoles without much relief. Foot is nontender at rest. Denies numbness, tingling, paresthesias or wounds.  Patient also reports significant low libido with absence of a sex drive and difficulty achieving and maintaining erections present for years and worsening. He has been on Sildenafil in the past, but prefers not to take medication.   Health Maintenance Health Maintenance  Topic Date Due  . INFLUENZA VACCINE  02/10/2017  . OPHTHALMOLOGY EXAM  02/25/2017 (Originally 03/27/1982)  . TETANUS/TDAP  07/13/2017 (Originally 03/28/1991)  . HIV Screening  07/13/2017 (Originally 03/28/1987)  . HEMOGLOBIN A1C  07/15/2017  . FOOT EXAM  01/12/2018  . PNEUMOCOCCAL POLYSACCHARIDE VACCINE (2) 02/18/2022    Past Medical History:  Diagnosis Date  . ADHD   . Anxiety   . Depression   . Diabetes mellitus without complication ([place])   . Dyslipidemia associated with type 2 diabetes mellitus ([place]) 01/21/2017  . Erectile dysfunction 08/04/2016  . Insomnia 06/29/2016  . Low libido 08/04/2016  . Shoulder dislocation 2008   Left shoulder  . Social anxiety disorder   . Tobacco use disorder    No past surgical history on file. Social History  Substance Use Topics  . Smoking status: Current Every Day Smoker    Packs/day: 1.00    Years: 25.00    Types: Cigarettes    Last attempt to quit: 03/27/2016  . Smokeless tobacco: Never Used  . Alcohol use No     Comment: Drank 6-10 beers. Last drank during Christmas   family history includes Breast cancer in his  paternal grandfather and paternal grandmother; Diabetes type II in his maternal grandmother and mother; Hypertension in his maternal grandmother, mother, and sister.  ROS: negative except as noted in the HPI  Medications: Current Outpatient Prescriptions  Medication Sig Dispense Refill  . AMBULATORY NON FORMULARY MEDICATION Single glucometer with lancets, test strips.  Check morning fasting blood sugar daily 1 each 0  . aspirin EC 81 MG tablet Take 1 tablet (81 mg total) by mouth daily. 90 tablet 3  . atorvastatin (LIPITOR) 10 MG tablet Take 1 tablet (10 mg total) by mouth daily. 90 tablet 3  . Dapagliflozin-Metformin HCl ER (XIGDUO XR) 11-998 MG TB24 Take 5-1,000 mg by mouth 2 (two) times daily with a meal. 180 tablet 0  . escitalopram (LEXAPRO) 20 MG tablet One half tab daily for a week then one tab by mouth daily 30 tablet 1  . lisinopril (PRINIVIL,ZESTRIL) 10 MG tablet Take 1 tablet (10 mg total) by mouth daily. 30 tablet 3  . ONE TOUCH ULTRA TEST test strip TEST DAILY AS DIRECTED  0  . ONETOUCH DELICA LANCETS 28Z MISC TEST DAILY AS DIRECTED  0  . varenicline (CHANTIX) 1 MG tablet Take 1 tablet (1 mg  total) by mouth 2 (two) times daily. 60 tablet 3  . Blood Glucose Monitoring Suppl (ONE TOUCH ULTRA 2) w/Device KIT Inject as directed daily.  0   No current facility-administered medications for this visit.    No Known Allergies     Objective:  BP 116/76   Pulse 75   Temp 98 F (36.7 C)   Ht 6' (1.829 m)   Wt 239 lb 1.6 oz (108.5 kg)   SpO2 97%   BMI 32.43 kg/m  General Appearance:  Obese male, cooperative, no distress, appropriate for age                            Head:  Normocephalic, without obvious abnormality                             Eyes:  PERRL, EOM's intact, conjunctiva and cornea clear, visual acuity right 20/15, left 2025, b/l 20/15                             Ears:  TM pearly gray color and semitransparent, external ear canals normal, both ears                             Nose:  Nares symmetrical                          Throat:  Lips, tongue, and mucosa are moist, pink, and intact; good dentition                             Neck:  Supple; symmetrical, trachea midline, no adenopathy; thyroid: no enlargement, symmetric, no tenderness/mass/nodules                             Back:  Symmetrical, no curvature, ROM normal                                    Lungs:  Clear to auscultation bilaterally, respirations unlabored                             Heart:  regular rate & normal rhythm, S1 and S2 normal, no murmurs, rubs, or gallops                     Abdomen:  Soft, non-tender, no mass or organomegaly              Genitourinary:  deferred         Musculoskeletal:  Tone and strength strong and symmetrical, all extremities; no joint pain or edema                                       Lymphatic:  No adenopathy             Skin/Hair/Nails:  Skin warm, dry and intact, no rashes or abnormal dyspigmentation, bilateral calluses on plantar aspect at the 5th MTP joint  Neurologic:  Alert and oriented x3, no cranial nerve deficits, sensation grossly intact, normal gait and station, no tremor    No results found for this or any previous visit (from the past 72 hour(s)). No results found. Depression screen [facility] 2/9 02/18/2017 06/29/2016  Decreased Interest 0 3  Down, Depressed, Hopeless 0 0  PHQ - 2 Score 0 3  Altered sleeping 1 3  Tired, decreased energy 0 2  Change in appetite 0 0  Feeling bad or failure about yourself  0 0  Trouble concentrating 1 3  Moving slowly or fidgety/restless 0 0  Suicidal thoughts 0 0  PHQ-9 Score 2 11   GAD 7 : Generalized Anxiety Score 02/18/2017 08/03/2016 06/29/2016  Nervous, Anxious, on Edge 1 1 3   Control/stop worrying 1 1 2   Worry too much - different things 1 2 2   Trouble relaxing 1 2 2   Restless 0 0 0  Easily annoyed or irritable 0 0 1  Afraid - awful might happen 0 1 1  Total GAD 7 Score 4 7 11         Assessment and Plan: 46 y.o. male with   1. Annual physical exam - negative PHQ2/depression screen - reviewed health maintenance - declined Tdap  2. Callus of foot - calluses unloaded bilaterally in office today - Ambulatory referral to Podiatry  3. Screening for diabetic retinopathy - Ambulatory referral to Ophthalmology  4. Need for 23-polyvalent pneumococcal polysaccharide vaccine - Pneumovax given per guidelines for diabetes - pneumococcal 23 valent vaccine (PNU-IMMUNE) injection 0.5 mL; Inject 0.5 mLs into the muscle once.  5. Low libido Lab Results  Component Value Date   TESTOSTERONE 338 01/19/2017  - patient's testosterone is on the low end of normal and symptomatic - will trial low-dose TRT, 53m Q2weeks and see if there is improvement in libido. If no improvement with TRT, symptoms are likely not hormonal and related to mood/anxiety disorder  Patient education and anticipatory guidance given Patient agrees with treatment plan Follow-up in 2 months for uncontrolled type II DM or sooner as needed  CDarlyne RussianPA-C

## 2017-02-22 ENCOUNTER — Encounter: Payer: Self-pay | Admitting: Physician Assistant

## 2017-02-22 DIAGNOSIS — L84 Corns and callosities: Secondary | ICD-10-CM | POA: Insufficient documentation

## 2017-03-25 ENCOUNTER — Other Ambulatory Visit: Payer: Self-pay | Admitting: Physician Assistant

## 2017-03-25 DIAGNOSIS — F4010 Social phobia, unspecified: Secondary | ICD-10-CM

## 2017-04-06 ENCOUNTER — Other Ambulatory Visit: Payer: Self-pay

## 2017-04-06 DIAGNOSIS — F17200 Nicotine dependence, unspecified, uncomplicated: Secondary | ICD-10-CM

## 2017-04-06 MED ORDER — VARENICLINE TARTRATE 1 MG PO TABS: 1.0000 mg | ORAL_TABLET | Freq: Two times a day (BID) | ORAL | 3 refills | Status: DC

## 2017-04-20 ENCOUNTER — Other Ambulatory Visit: Payer: Self-pay | Admitting: Physician Assistant

## 2017-04-28 ENCOUNTER — Ambulatory Visit: Payer: Self-pay | Admitting: Physician Assistant

## 2017-05-07 ENCOUNTER — Ambulatory Visit (INDEPENDENT_AMBULATORY_CARE_PROVIDER_SITE_OTHER): Payer: Managed Care, Other (non HMO) | Admitting: Physician Assistant

## 2017-05-07 ENCOUNTER — Encounter: Payer: Self-pay | Admitting: Physician Assistant

## 2017-05-07 VITALS — BP 124/85 | HR 82 | Wt 245.0 lb

## 2017-05-07 DIAGNOSIS — Z79899 Other long term (current) drug therapy: Secondary | ICD-10-CM

## 2017-05-07 DIAGNOSIS — Z794 Long term (current) use of insulin: Secondary | ICD-10-CM | POA: Insufficient documentation

## 2017-05-07 DIAGNOSIS — IMO0002 Reserved for concepts with insufficient information to code with codable children: Secondary | ICD-10-CM

## 2017-05-07 DIAGNOSIS — E785 Hyperlipidemia, unspecified: Secondary | ICD-10-CM | POA: Diagnosis not present

## 2017-05-07 DIAGNOSIS — Z716 Tobacco abuse counseling: Secondary | ICD-10-CM | POA: Diagnosis not present

## 2017-05-07 DIAGNOSIS — E1165 Type 2 diabetes mellitus with hyperglycemia: Secondary | ICD-10-CM

## 2017-05-07 DIAGNOSIS — E1169 Type 2 diabetes mellitus with other specified complication: Secondary | ICD-10-CM

## 2017-05-07 DIAGNOSIS — E1129 Type 2 diabetes mellitus with other diabetic kidney complication: Secondary | ICD-10-CM | POA: Diagnosis not present

## 2017-05-07 DIAGNOSIS — R809 Proteinuria, unspecified: Secondary | ICD-10-CM | POA: Diagnosis not present

## 2017-05-07 DIAGNOSIS — F17200 Nicotine dependence, unspecified, uncomplicated: Secondary | ICD-10-CM | POA: Diagnosis not present

## 2017-05-07 LAB — POCT GLYCOSYLATED HEMOGLOBIN (HGB A1C): Hemoglobin A1C: 7.3

## 2017-05-07 MED ORDER — DAPAGLIFLOZIN PRO-METFORMIN ER 5-500 MG PO TB24: 2.0000 | ORAL_TABLET | Freq: Two times a day (BID) | ORAL | 0 refills | Status: DC

## 2017-05-07 NOTE — Progress Notes (Signed)
HPI:                                                                Albert [name] is a 46 y.o. male who presents to [place]: [place] today for diabetes follow-up / smoking cessation  DMII: taking XigDuo daily. Compliant with medications. Last A1C 9.9 (05/07/2017). Checks blood sugars at home. Blood sugar range 150-160. Denies polyuria, vision change, and paresthesias. Denies hypoglycemic events. Denies ulcers/wounds on feet. Eye exam: overdue, cancelled his appointment Foot exam: UTD Diet: poor, fast food due to stress Exercise: none  HTN: taking Lisinopril daily. Compliant with medications. Does not check BP's at home. Denies vision change, headache, chest pain with exertion, orthopnea, lightheadedness, syncope and edema. Risk factors include: DMII, tobacco use, obesity, male  Tobacco: currently taking chantix. Has not smoked in 2 weeks.   HLD: taking Atorvastatin nightly. Compliant with medications. Denies myalgias.  Past Medical History:  Diagnosis Date  . ADHD   . Anxiety   . Depression   . Diabetes mellitus without complication ([place])   . Dyslipidemia associated with type 2 diabetes mellitus ([place]) 01/21/2017  . Erectile dysfunction 08/04/2016  . Insomnia 06/29/2016  . Low libido 08/04/2016  . Shoulder dislocation 2008   Left shoulder  . Social anxiety disorder   . Tobacco use disorder    No past surgical history on file. Social History  Substance Use Topics  . Smoking status: Current Every Day Smoker    Packs/day: 1.00    Years: 25.00    Types: Cigarettes    Last attempt to quit: 03/27/2016  . Smokeless tobacco: Never Used  . Alcohol use No     Comment: Drank 6-10 beers. Last drank during Christmas   family history includes Breast cancer in his paternal grandfather and paternal grandmother; Diabetes type II in his maternal grandmother and mother; Hypertension in his maternal grandmother, mother, and sister.  ROS: negative  except as noted in the HPI  Medications: Current Outpatient Prescriptions  Medication Sig Dispense Refill  . AMBULATORY NON FORMULARY MEDICATION Single glucometer with lancets, test strips.  Check morning fasting blood sugar daily 1 each 0  . aspirin EC 81 MG tablet Take 1 tablet (81 mg total) by mouth daily. 90 tablet 3  . atorvastatin (LIPITOR) 10 MG tablet Take 1 tablet (10 mg total) by mouth daily. 90 tablet 3  . Blood Glucose Monitoring Suppl (ONE TOUCH ULTRA 2) w/Device KIT Inject as directed daily.  0  . Dapagliflozin-Metformin HCl ER (XIGDUO XR) 11-998 MG TB24 Take 5-1,000 mg by mouth 2 (two) times daily with a meal. 180 tablet 0  . escitalopram (LEXAPRO) 20 MG tablet Take 1 tablet (20 mg total) by mouth daily. 30 tablet 4  . lisinopril (PRINIVIL,ZESTRIL) 10 MG tablet Take 1 tablet (10 mg total) by mouth daily. 30 tablet 3  . ONE TOUCH ULTRA TEST test strip TEST DAILY AS DIRECTED 100 each 0  . ONETOUCH DELICA LANCETS 63S MISC TEST DAILY AS DIRECTED  0  . varenicline (CHANTIX) 1 MG tablet Take 1 tablet (1 mg total) by mouth 2 (two) times daily. 60 tablet 3   No current facility-administered medications for this visit.    No Known Allergies  Objective:  BP 124/85   Pulse 82   Wt 245 lb (111.1 kg)   BMI 33.23 kg/m  Gen:  alert, not ill-appearing, no distress, appropriate for age, obese male HEENT: head normocephalic without obvious abnormality, conjunctiva and cornea clear, trachea midline Pulm: Normal work of breathing, normal phonation, clear to auscultation bilaterally, no wheezes, rales or rhonchi CV: Normal rate, regular rhythm, s1 and s2 distinct, no murmurs, clicks or rubs  Neuro: alert and oriented x 3, no tremor MSK: extremities atraumatic, normal gait and station Skin: intact, no rashes on exposed skin, no jaundice, no cyanosis      No results found for this or any previous visit (from the past 72 hour(s)). No results found.    Assessment and Plan: 46  y.o. male with   1. Uncontrolled type 2 diabetes mellitus with microalbuminuria, without long-term current use of insulin (HCC) - POCT HgB A1C 7.6 today, improved; goal is <=6.5. Good response to the Xigduo. I think we can get him at goal with therapeutic lifestyle changes/weight loss. - counseled on weight loss through decreasing calories, limiting carbohydrates, and increasing exercise - on ACE  - on statin - declines influenza vaccine today - Dapagliflozin-Metformin HCl ER (XIGDUO XR) 5-500 MG TB24; Take 2 tablets by mouth 2 (two) times daily.  Dispense: 360 tablet; Refill: 0  2. Dyslipidemia, goal LDL below 70 - 10 yr ASCVD risk   3. Tobacco use disorder   4. Encounter for smoking cessation counseling - quit date 04/23/17 - continue Chantix for at least 10 more weeks for a total of 12 weeks tobacco free  5. Encounter for long-term (current) use of medications   Patient education and anticipatory guidance given Patient agrees with treatment plan Follow-up in 3 months or sooner as needed if symptoms worsen or fail to improve  Darlyne Russian PA-C

## 2017-05-07 NOTE — Patient Instructions (Addendum)
-   aim for morning fasting sugars 80-130 (a1c goal <=6.5) - limit carbohydrates to 30g at meals and 15g for snacks (TDD = 120g) - goal weight 200 pounds

## 2017-05-21 ENCOUNTER — Telehealth: Payer: Self-pay

## 2017-05-21 DIAGNOSIS — E1165 Type 2 diabetes mellitus with hyperglycemia: Secondary | ICD-10-CM

## 2017-05-21 MED ORDER — DAPAGLIFLOZIN PRO-METFORMIN ER 5-1000 MG PO TB24: 2.0000 | ORAL_TABLET | Freq: Every day | ORAL | 5 refills | Status: DC

## 2017-05-21 NOTE — Telephone Encounter (Signed)
Pt was recently taken off of Xigduo 11-998 mg and placed on Xigduo 5-500 mg.  He reports that the copay for the new is too much.  He is wanting to go back on previous dose. Please advise. -EH/RMA

## 2017-05-21 NOTE — Telephone Encounter (Signed)
New prescription sent

## 2017-05-27 ENCOUNTER — Telehealth: Payer: Self-pay | Admitting: Physician Assistant

## 2017-05-27 NOTE — Telephone Encounter (Signed)
xigduo is approved from 04/27/17-05/26/2020.pharmacy notified

## 2017-05-27 NOTE — Telephone Encounter (Signed)
Received fax for PA on Xigduo sent through cover my meds waiting on determination. - CF 

## 2017-05-31 ENCOUNTER — Telehealth: Payer: Self-pay

## 2017-05-31 DIAGNOSIS — E1129 Type 2 diabetes mellitus with other diabetic kidney complication: Secondary | ICD-10-CM

## 2017-05-31 DIAGNOSIS — E1165 Type 2 diabetes mellitus with hyperglycemia: Principal | ICD-10-CM

## 2017-05-31 DIAGNOSIS — R809 Proteinuria, unspecified: Principal | ICD-10-CM

## 2017-05-31 DIAGNOSIS — IMO0002 Reserved for concepts with insufficient information to code with codable children: Secondary | ICD-10-CM

## 2017-05-31 NOTE — Telephone Encounter (Signed)
Patient called stated that the combination with Xigduo cost him $4000 and he is requesting a new Rx for the Xigduo by itself. Please advise. Rhonda Cunningham,CMA

## 2017-06-01 MED ORDER — DAPAGLIFLOZIN PROPANEDIOL 10 MG PO TABS: 10.0000 mg | ORAL_TABLET | Freq: Every day | ORAL | 5 refills | Status: DC

## 2017-06-01 MED ORDER — METFORMIN HCL 1000 MG PO TABS: 1000.0000 mg | ORAL_TABLET | Freq: Two times a day (BID) | ORAL | 5 refills | Status: DC

## 2017-06-01 NOTE — Addendum Note (Signed)
Addended by: Gena FrayUMMINGS, CHARLEY E on: 06/01/2017 12:50 PM   Modules accepted: Orders

## 2017-06-05 NOTE — Telephone Encounter (Signed)
New prescriptions sent for Farxiga and Metformin (the components of Xigduo)

## 2017-06-07 NOTE — Telephone Encounter (Signed)
Spoke with Pt, he decided to stick with the Xigduo Rx vs the separate pills. Pt states taking one big pill is easier. [facility] so nothing new needed at this time.

## 2017-06-25 ENCOUNTER — Other Ambulatory Visit: Payer: Self-pay | Admitting: Physician Assistant

## 2017-06-25 DIAGNOSIS — R809 Proteinuria, unspecified: Principal | ICD-10-CM

## 2017-06-25 DIAGNOSIS — IMO0002 Reserved for concepts with insufficient information to code with codable children: Secondary | ICD-10-CM

## 2017-06-25 DIAGNOSIS — E1129 Type 2 diabetes mellitus with other diabetic kidney complication: Secondary | ICD-10-CM

## 2017-06-25 DIAGNOSIS — E1165 Type 2 diabetes mellitus with hyperglycemia: Principal | ICD-10-CM

## 2017-07-14 ENCOUNTER — Other Ambulatory Visit: Payer: Self-pay

## 2017-07-14 DIAGNOSIS — E1169 Type 2 diabetes mellitus with other specified complication: Secondary | ICD-10-CM

## 2017-07-14 DIAGNOSIS — E785 Hyperlipidemia, unspecified: Secondary | ICD-10-CM

## 2017-07-14 MED ORDER — ATORVASTATIN CALCIUM 10 MG PO TABS: 10.0000 mg | ORAL_TABLET | Freq: Every day | ORAL | 1 refills | Status: DC

## 2017-07-16 ENCOUNTER — Ambulatory Visit (INDEPENDENT_AMBULATORY_CARE_PROVIDER_SITE_OTHER): Payer: Managed Care, Other (non HMO)

## 2017-07-16 ENCOUNTER — Ambulatory Visit: Payer: Managed Care, Other (non HMO) | Admitting: Physician Assistant

## 2017-07-16 ENCOUNTER — Encounter: Payer: Self-pay | Admitting: Physician Assistant

## 2017-07-16 VITALS — BP 149/92 | HR 87 | Temp 98.1°F | Resp 16 | Ht 72.0 in | Wt 252.5 lb

## 2017-07-16 DIAGNOSIS — R0689 Other abnormalities of breathing: Secondary | ICD-10-CM | POA: Diagnosis not present

## 2017-07-16 DIAGNOSIS — R0989 Other specified symptoms and signs involving the circulatory and respiratory systems: Secondary | ICD-10-CM

## 2017-07-16 DIAGNOSIS — Z720 Tobacco use: Secondary | ICD-10-CM | POA: Diagnosis not present

## 2017-07-16 DIAGNOSIS — R0609 Other forms of dyspnea: Secondary | ICD-10-CM | POA: Diagnosis not present

## 2017-07-16 DIAGNOSIS — J22 Unspecified acute lower respiratory infection: Secondary | ICD-10-CM

## 2017-07-16 MED ORDER — AZITHROMYCIN 250 MG PO TABS: ORAL_TABLET | ORAL | 0 refills | Status: DC

## 2017-07-16 MED ORDER — IPRATROPIUM-ALBUTEROL 0.5-2.5 (3) MG/3ML IN SOLN: 3.0000 mL | Freq: Four times a day (QID) | RESPIRATORY_TRACT | Status: DC

## 2017-07-16 MED ORDER — ALBUTEROL SULFATE HFA 108 (90 BASE) MCG/ACT IN AERS: 1.0000 | INHALATION_SPRAY | RESPIRATORY_TRACT | 0 refills | Status: DC | PRN

## 2017-07-16 MED ORDER — GUAIFENESIN ER 600 MG PO TB12: 600.0000 mg | ORAL_TABLET | Freq: Two times a day (BID) | ORAL | 0 refills | Status: DC

## 2017-07-16 MED ORDER — PREDNISONE 20 MG PO TABS: 40.0000 mg | ORAL_TABLET | Freq: Every day | ORAL | 0 refills | Status: AC

## 2017-07-16 NOTE — Progress Notes (Signed)
HPI:                                                                Albert [name] is a 47 y.o. male who presents to [place]: [place] today for cough  Cough  This is a new problem. The current episode started 1 to 4 weeks ago. The problem has been gradually worsening. The problem occurs every few minutes. The cough is productive of sputum. Associated symptoms include shortness of breath. Pertinent negatives include no chest pain, ear pain, fever, nasal congestion, postnasal drip, rash, rhinorrhea or wheezing. The symptoms are aggravated by exercise. Risk factors for lung disease include smoking/tobacco exposure. Treatments tried: Dayquil. The treatment provided mild relief. There is no history of asthma or COPD (tobacco use).    Past Medical History:  Diagnosis Date  . ADHD   . Anxiety   . Depression   . Diabetes mellitus without complication ([place])   . Dyslipidemia associated with type 2 diabetes mellitus ([place]) 01/21/2017  . Erectile dysfunction 08/04/2016  . Insomnia 06/29/2016  . Low libido 08/04/2016  . Microalbuminuria   . Shoulder dislocation 2008   Left shoulder  . Social anxiety disorder   . Tobacco use disorder    Past Surgical History:  Procedure Laterality Date  . NO PAST SURGERIES     Social History   Tobacco Use  . Smoking status: Former Smoker    Packs/day: 1.00    Years: 25.00    Pack years: 25.00    Types: Cigarettes    Last attempt to quit: 04/23/2017    Years since quitting: 0.2  . Smokeless tobacco: Never Used  Substance Use Topics  . Alcohol use: No    Comment: Drank 6-10 beers. Last drank during Christmas   family history includes Breast cancer in his paternal grandfather and paternal grandmother; Diabetes type II in his maternal grandmother and mother; Hypertension in his maternal grandmother, mother, and sister.  ROS: negative except as noted in the HPI  Medications: Current Outpatient Medications   Medication Sig Dispense Refill  . AMBULATORY NON FORMULARY MEDICATION Single glucometer with lancets, test strips.  Check morning fasting blood sugar daily 1 each 0  . aspirin EC 81 MG tablet Take 1 tablet (81 mg total) by mouth daily. 90 tablet 3  . atorvastatin (LIPITOR) 10 MG tablet Take 1 tablet (10 mg total) by mouth daily. 90 tablet 1  . Blood Glucose Monitoring Suppl (ONE TOUCH ULTRA 2) w/Device KIT Inject as directed daily.  0  . dapagliflozin propanediol (FARXIGA) 10 MG TABS tablet Take 10 mg by mouth daily. 30 tablet 5  . Dapagliflozin-Metformin HCl ER (XIGDUO XR) 11-998 MG TB24 Take 2 tablets daily by mouth. 60 tablet 5  . escitalopram (LEXAPRO) 20 MG tablet Take 1 tablet (20 mg total) by mouth daily. 30 tablet 4  . lisinopril (PRINIVIL,ZESTRIL) 10 MG tablet TAKE 1 TABLET BY MOUTH EVERY DAY 30 tablet 3  . metFORMIN (GLUCOPHAGE) 1000 MG tablet Take 1 tablet (1,000 mg total) by mouth 2 (two) times daily with a meal. 60 tablet 5  . ONE TOUCH ULTRA TEST test strip TEST DAILY AS DIRECTED 100 each 0  . ONETOUCH DELICA LANCETS 53Z MISC TEST DAILY AS DIRECTED  0  . varenicline (CHANTIX) 1 MG tablet Take 1 tablet (1 mg total) by mouth 2 (two) times daily. 60 tablet 3   No current facility-administered medications for this visit.    No Known Allergies     Objective:  BP (!) 149/92   Pulse 79   Temp 98.1 F (36.7 C) (Oral)   Resp 16   Ht 6' (1.829 m)   Wt 252 lb 8 oz (114.5 kg)   SpO2 97%   BMI 34.25 kg/m  Gen:  alert, ill-appearing, not toxic-appearing, no distress, appropriate for age, obese male HEENT: head normocephalic without obvious abnormality, conjunctiva and cornea clear, oropharynx with clear, neck supple, no adenopathy, trachea midline Pulm: Normal work of breathing, voice is hoarse, breath sounds coarse on inspiration, diminished on expiration CV: Normal rate, regular rhythm, s1 and s2 distinct, no murmurs, clicks or rubs  Neuro: alert and oriented x 3, no  tremor MSK: extremities atraumatic, normal gait and station Skin: intact, no rashes on exposed skin, no cyanosis   No results found for this or any previous visit (from the past 72 hour(s)). No results found.   Assessment and Plan: 47 y.o. male with   1. Acute lower respiratory infection - DG Chest 2 View to assess for infiltrate - cough x 10 days in smoker with diabetes. No evidence of respiratory distress. Will treat empirically for CAP with Azithromycin. Duoneb given in office today. Follow-up in 1 week - predniSONE (DELTASONE) 20 MG tablet; Take 2 tablets (40 mg total) by mouth daily with breakfast for 5 days.  Dispense: 10 tablet; Refill: 0 - albuterol (PROVENTIL HFA;VENTOLIN HFA) 108 (90 Base) MCG/ACT inhaler; Inhale 1-2 puffs into the lungs every 4 (four) hours as needed for wheezing or shortness of breath.  Dispense: 1 Inhaler; Refill: 0 - guaiFENesin (MUCINEX) 600 MG 12 hr tablet; Take 1 tablet (600 mg total) by mouth 2 (two) times daily.  Dispense: 20 tablet; Refill: 0 - azithromycin (ZITHROMAX Z-PAK) 250 MG tablet; Take 2 tablets (500 mg) on  Day 1,  followed by 1 tablet (250 mg) once daily on Days 2 through 5.  Dispense: 6 tablet; Refill: 0   Patient education and anticipatory guidance given Patient agrees with treatment plan Follow-up in 1 week or sooner as needed if symptoms worsen or fail to improve  Darlyne Russian PA-C

## 2017-07-16 NOTE — Patient Instructions (Signed)

## 2017-07-17 NOTE — Progress Notes (Signed)
Hello Albert [name],  Your chest x-ray shows bronchitis. The treatment plan does not change. Come in and see me for smoking cessation follow-up when you are ready.  Hope you are feeling better! Albert [name]

## 2017-07-19 MED ORDER — IPRATROPIUM-ALBUTEROL 0.5-2.5 (3) MG/3ML IN SOLN
3.0000 mL | Freq: Once | RESPIRATORY_TRACT | Status: AC
  Administered 2017-07-16: 3 mL via RESPIRATORY_TRACT

## 2017-07-19 NOTE — Addendum Note (Signed)
Addended by: Baird KayUGLAS, Jamason Peckham M on: 07/19/2017 08:53 AM   Modules accepted: Orders

## 2017-07-20 ENCOUNTER — Telehealth: Payer: Self-pay

## 2017-07-20 NOTE — Telephone Encounter (Signed)
Left message on machine to call back  

## 2017-07-20 NOTE — Telephone Encounter (Signed)
-----   Message from Mck[facility] Hospital CenterCharley Elizabeth Cummings, [place] JerseyPA-C sent at 07/17/2017  4:44 PM EST ----- Feliberto GottronHello Chauncy,  Your chest x-ray shows bronchitis. The treatment plan does not change. Come in and see me for smoking cessation follow-up when you are ready.  Hope you are feeling better! Vinetta Bergamoharley

## 2017-08-13 ENCOUNTER — Ambulatory Visit: Payer: Managed Care, Other (non HMO) | Admitting: Physician Assistant

## 2017-08-13 ENCOUNTER — Encounter: Payer: Self-pay | Admitting: Physician Assistant

## 2017-08-13 VITALS — BP 129/88 | HR 96 | Wt 252.0 lb

## 2017-08-13 DIAGNOSIS — E1129 Type 2 diabetes mellitus with other diabetic kidney complication: Secondary | ICD-10-CM | POA: Diagnosis not present

## 2017-08-13 DIAGNOSIS — E1165 Type 2 diabetes mellitus with hyperglycemia: Secondary | ICD-10-CM

## 2017-08-13 DIAGNOSIS — Z79899 Other long term (current) drug therapy: Secondary | ICD-10-CM | POA: Diagnosis not present

## 2017-08-13 DIAGNOSIS — R809 Proteinuria, unspecified: Secondary | ICD-10-CM | POA: Diagnosis not present

## 2017-08-13 DIAGNOSIS — E559 Vitamin D deficiency, unspecified: Secondary | ICD-10-CM

## 2017-08-13 DIAGNOSIS — Z130 Encounter for screening for diseases of the blood and blood-forming organs and certain disorders involving the immune mechanism: Secondary | ICD-10-CM

## 2017-08-13 DIAGNOSIS — Z1329 Encounter for screening for other suspected endocrine disorder: Secondary | ICD-10-CM | POA: Diagnosis not present

## 2017-08-13 DIAGNOSIS — Z2821 Immunization not carried out because of patient refusal: Secondary | ICD-10-CM | POA: Diagnosis not present

## 2017-08-13 DIAGNOSIS — Z1321 Encounter for screening for nutritional disorder: Secondary | ICD-10-CM | POA: Diagnosis not present

## 2017-08-13 DIAGNOSIS — E785 Hyperlipidemia, unspecified: Secondary | ICD-10-CM | POA: Diagnosis not present

## 2017-08-13 DIAGNOSIS — F4010 Social phobia, unspecified: Secondary | ICD-10-CM | POA: Diagnosis not present

## 2017-08-13 DIAGNOSIS — IMO0002 Reserved for concepts with insufficient information to code with codable children: Secondary | ICD-10-CM

## 2017-08-13 DIAGNOSIS — J3489 Other specified disorders of nose and nasal sinuses: Secondary | ICD-10-CM

## 2017-08-13 LAB — POCT GLYCOSYLATED HEMOGLOBIN (HGB A1C): HEMOGLOBIN A1C: 7.9

## 2017-08-13 MED ORDER — DAPAGLIFLOZIN PROPANEDIOL 10 MG PO TABS: 10.0000 mg | ORAL_TABLET | Freq: Every day | ORAL | 2 refills | Status: DC

## 2017-08-13 MED ORDER — METFORMIN HCL 500 MG PO TABS: 500.0000 mg | ORAL_TABLET | Freq: Every day | ORAL | 2 refills | Status: DC

## 2017-08-13 MED ORDER — CETIRIZINE HCL 10 MG PO TABS: 10.0000 mg | ORAL_TABLET | Freq: Every day | ORAL | 11 refills | Status: DC

## 2017-08-13 MED ORDER — IPRATROPIUM BROMIDE 0.06 % NA SOLN: 1.0000 | Freq: Four times a day (QID) | NASAL | 2 refills | Status: DC | PRN

## 2017-08-13 MED ORDER — DULAGLUTIDE 0.75 MG/0.5ML ~~LOC~~ SOAJ: 0.5000 mL | SUBCUTANEOUS | 3 refills | Status: DC

## 2017-08-13 NOTE — Progress Notes (Signed)
a1c

## 2017-08-13 NOTE — Patient Instructions (Addendum)
For Diabetes: - reduce Metformin to 1 tablet with breakfast - take [place]iga tablet daily with breakfast - start Trulicity - 1 injection weekly  For runny nose: - Atrovent 1-2 sprays each nostril 4 times daily as needed - Zyrtec 1 tablet daily (antihistamine)  For anxiety: - go to [email or web address] - search for counselors specializing in anxiety - contact us if you need a referral  Cognitive Behavioral Therapy Cognitive behavioral therapy (CBT) is a short-term, goal-oriented type of talk therapy. CBT can help you:  Identify patterns of thinking, feeling, and behaving that are causing you problems.  Decide how you want to think, feel, and respond to life events.  Set goals to change the beliefs and thoughts that cause you to act in ways that are not helpful for you.  Follow up on the changes that you make.  What are the different types of CBT? The different types of CBT include:  Dialectical behavioral therapy (DBT). This approach is often used in group therapy, and it aids a person in managing behavior by focusing on: ? Things that cause problems to start (triggers). ? Methods of self-calming. ? Re-evaluating thinking processes.  Mindfulness-based cognitive therapy. This approach involves focusing your attention, meditating, and developing awareness of the present moment (mindfulness).  Rational emotive behavior therapy. This approach uses rational thought to reframe your thinking so it is less judgmental. Your therapist may directly challenge your thought processes.  Stress inoculation training. This approach involves planning ahead for stressful situations by practicing new thoughts and behaviors. This planning can help you avoid going back to old actions.  Acceptance and commitment therapy (ACT). This approach focuses on accepting yourself as you are and practicing mindfulness. It helps you understand what you would like to change and how you can set goals in that  direction.  What conditions is CBT used to treat? CBT may help to treat:  Mental health conditions, including: ? Depression. ? Anxiety. ? Bipolar disorder. ? Eating disorders. ? Post-traumatic stress disorder (PTSD). ? Obsessive-compulsive disorder (OCD).  Insomnia and other sleep disorders.  Pain.  Stress.  Coping with loss or grief.  Coping with a difficult medical diagnosis or illness.  Relationship problems.  Emotional distress or shock (trauma).  How can CBT help me? CBT may:  Give you a chance to share your thoughts, feelings, problems, and fears in a safe space.  Help you focus on specific problems.  Give you homework that helps you put theory into practice. Homework may include keeping a journal or doing thinking exercises.  Help you become aware of your patterns of thinking, feeling, and behaving, and how those three patterns affect each other.  Change your thoughts so that you can change your behaviors.  Help you chose how you want to view the world.  Teach you planned coping skills and offer better ways to deal with stress and difficult situations.  To make the most of CBT, make sure you:  Find a licensed therapist whom you trust.  Take an active part in your therapy and do the homework that you are given.  Are honest about your problems.  Avoid skipping your therapy sessions.  Summary  Cognitive behavioral therapy (CBT) is a short-term, goal-oriented type of talk therapy.  CBT can help you become aware of your patterns and the relationships among your thoughts, feelings, and behavior.  CBT may help mental health conditions and other problems. This information is not intended to replace advice given to you by your health care  provider. Make sure you discuss any questions you have with your health care provider. Document Released: 11/10/2016 Document Revised: 11/10/2016 Document Reviewed: 11/10/2016 Elsevier Interactive Patient Education  2018  [employer].

## 2017-08-13 NOTE — Progress Notes (Signed)
HPI:                                                                Albert [name] is a 47 y.o. male who presents to [facility] today for medication management   DMII: taking Xigduo daily. Reports there are days where he skips Metformin because they are "horse pills" and make him gag and occasionally vomit.. Last A1C 7.3, 3 months ago.  Denies polyuria, polydipsia, polyphagia. Denies blurred vision or vision change. Denies paresthesias or altered sensation. Denies hypoglycemic events. Denies ulcers/wounds on feet. Eye exam: overdue, scheduled Foot exam: UTD, normal  Rhinorrhea: continues to endorse nasal congestion, rhinorrhea and PND. He was treated for bronchitis with prednisone and azithromycin 4 weeks ago. States nasal symptoms persisted. He denies facial pressure, headache, dental pain or purulent nasal discharge.   GAD/social anxiety: doing well on Lexapro. Feels symptoms could be better controlled.   Depression screen [facility] 2/9 02/18/2017 06/29/2016  Decreased Interest 0 3  Down, Depressed, Hopeless 0 0  PHQ - 2 Score 0 3  Altered sleeping 1 3  Tired, decreased energy 0 2  Change in appetite 0 0  Feeling bad or failure about yourself  0 0  Trouble concentrating 1 3  Moving slowly or fidgety/restless 0 0  Suicidal thoughts 0 0  PHQ-9 Score 2 11    GAD 7 : Generalized Anxiety Score 02/18/2017 08/03/2016 06/29/2016  Nervous, Anxious, on Edge 1 1 3   Control/stop worrying 1 1 2   Worry too much - different things 1 2 2   Trouble relaxing 1 2 2   Restless 0 0 0  Easily annoyed or irritable 0 0 1  Afraid - awful might happen 0 1 1  Total GAD 7 Score 4 7 11       Past Medical History:  Diagnosis Date  . ADHD   . Anxiety   . Depression   . Diabetes mellitus without complication ([place])   . Dyslipidemia associated with type 2 diabetes mellitus ([place]) 01/21/2017  . Erectile dysfunction 08/04/2016  . Insomnia 06/29/2016  . Low libido  08/04/2016  . Microalbuminuria   . Shoulder dislocation 2008   Left shoulder  . Social anxiety disorder   . Tobacco use disorder    Past Surgical History:  Procedure Laterality Date  . NO PAST SURGERIES     Social History   Tobacco Use  . Smoking status: Former Smoker    Packs/day: 1.00    Years: 25.00    Pack years: 25.00    Types: Cigarettes    Last attempt to quit: 04/23/2017    Years since quitting: 0.3  . Smokeless tobacco: Never Used  Substance Use Topics  . Alcohol use: No    Comment: Drank 6-10 beers. Last drank during Christmas   family history includes Breast cancer in his paternal grandfather and paternal grandmother; Diabetes type II in his maternal grandmother and mother; Hypertension in his maternal grandmother, mother, and sister.    ROS: negative except as noted in the HPI  Medications: Current Outpatient Medications  Medication Sig Dispense Refill  . albuterol (PROVENTIL HFA;VENTOLIN HFA) 108 (90 Base) MCG/ACT inhaler Inhale 1-2 puffs into the lungs every 4 (four) hours as needed for wheezing or shortness  of breath. 1 Inhaler 0  . AMBULATORY NON FORMULARY MEDICATION Single glucometer with lancets, test strips.  Check morning fasting blood sugar daily 1 each 0  . aspirin EC 81 MG tablet Take 1 tablet (81 mg total) by mouth daily. 90 tablet 3  . atorvastatin (LIPITOR) 10 MG tablet Take 1 tablet (10 mg total) by mouth daily. 90 tablet 1  . Blood Glucose Monitoring Suppl (ONE TOUCH ULTRA 2) w/Device KIT Inject as directed daily.  0  . dapagliflozin propanediol (FARXIGA) 10 MG TABS tablet Take 10 mg by mouth daily. 30 tablet 2  . escitalopram (LEXAPRO) 20 MG tablet Take 1 tablet (20 mg total) by mouth daily. 30 tablet 4  . lisinopril (PRINIVIL,ZESTRIL) 10 MG tablet TAKE 1 TABLET BY MOUTH EVERY DAY 30 tablet 3  . ONE TOUCH ULTRA TEST test strip TEST DAILY AS DIRECTED 100 each 0  . ONETOUCH DELICA LANCETS 80D MISC TEST DAILY AS DIRECTED  0  . cetirizine (ZYRTEC)  10 MG tablet Take 1 tablet (10 mg total) by mouth daily. 30 tablet 11  . Dulaglutide (TRULICITY) 9.83 JA/2.5KN SOPN Inject 0.5 mLs into the skin once a week. 1 pen 3  . ipratropium (ATROVENT) 0.06 % nasal spray Place 1-2 sprays into both nostrils 4 (four) times daily as needed. 15 mL 2  . metFORMIN (GLUCOPHAGE) 500 MG tablet Take 1 tablet (500 mg total) by mouth daily with breakfast. 30 tablet 2   No current facility-administered medications for this visit.    No Known Allergies     Objective:  BP 129/88   Pulse 96   Wt 252 lb (114.3 kg)   BMI 34.18 kg/m  Gen:  alert, not ill-appearing, no distress, appropriate for age, obese male HEENT: head normocephalic without obvious abnormality, conjunctiva and cornea clear, nasal mucosa edematous with rhinorrhea, no sinus tenderness, oropharynx clear, neck supple, no adenopathy, trachea midline Pulm: Normal work of breathing, normal phonation, clear to auscultation bilaterally, no wheezes, rales or rhonchi CV: Normal rate, regular rhythm, s1 and s2 distinct, no murmurs, clicks or rubs  Neuro: alert and oriented x 3, no tremor MSK: extremities atraumatic, normal gait and station, no peripheral edema Skin: intact, no rashes on exposed skin, no jaundice, no cyanosis Psych: well-groomed, cooperative, good eye contact, euthymic mood, affect mood-congruent, speech is articulate, and thought processes clear and goal-directed    Results for orders placed or performed in visit on 08/13/17 (from the past 72 hour(s))  POCT HgB A1C     Status: None   Collection Time: 08/13/17  8:51 AM  Result Value Ref Range   Hemoglobin A1C 7.9    No results found.    Assessment and Plan: 47 y.o. male with   1. Uncontrolled type 2 diabetes mellitus with microalbuminuria, without long-term current use of insulin (HCC) Lab Results  Component Value Date   HGBA1C 7.9 08/13/2017  - worsened by 0.6 points due to medication noncompliance. I do not think [name] is  going to be a good option due to difficulties with administration. We discussed that alternative would be a GLP1 since it is injectable. He is amenable to this. We reviewed administration instructions for Trulicity. He will continue [place] and we will reduce Metformin to 500 mg with breakfast - on ACE and statin - BP goal <130/80 - pneumovax UTD, declines influenza - referred to ophthalmology in August, has not scheduled his eye exam - counseled on diabetic nutrition, 1800 calorie diet to facilitate weight loss - follow-up  in 3 months  2. Rhinorrhea - no evidence of bacterial sinusitis - ipratropium (ATROVENT) 0.06 % nasal spray; Place 1-2 sprays into both nostrils 4 (four) times daily as needed.  Dispense: 15 mL; Refill: 2 - cetirizine (ZYRTEC) 10 MG tablet; Take 1 tablet (10 mg total) by mouth daily.  Dispense: 30 tablet; Refill: 11  3. Encounter for long-term (current) use of medications - POCT HgbA1c - fasting labs in 3 months at next office visit  4. Social anxiety disorder - recommend referral for CBT - patient will research counselors in his area on [email or web address]     Patient education and anticipatory guidance given Patient agrees with treatment plan Follow-up as needed if symptoms worsen or fail to improve  Darlyne Russian PA-C

## 2017-08-14 ENCOUNTER — Encounter: Payer: Self-pay | Admitting: Physician Assistant

## 2017-08-14 DIAGNOSIS — E559 Vitamin D deficiency, unspecified: Secondary | ICD-10-CM | POA: Insufficient documentation

## 2017-08-14 DIAGNOSIS — Z2821 Immunization not carried out because of patient refusal: Secondary | ICD-10-CM | POA: Insufficient documentation

## 2017-08-14 MED ORDER — ERGOCALCIFEROL 50 MCG (2000 UT) PO CAPS: 1.0000 | ORAL_CAPSULE | Freq: Every day | ORAL | Status: DC

## 2017-09-24 ENCOUNTER — Other Ambulatory Visit: Payer: Self-pay | Admitting: Family Medicine

## 2017-09-24 DIAGNOSIS — R809 Proteinuria, unspecified: Principal | ICD-10-CM

## 2017-09-24 DIAGNOSIS — IMO0002 Reserved for concepts with insufficient information to code with codable children: Secondary | ICD-10-CM

## 2017-09-24 DIAGNOSIS — E1165 Type 2 diabetes mellitus with hyperglycemia: Principal | ICD-10-CM

## 2017-09-24 DIAGNOSIS — E1129 Type 2 diabetes mellitus with other diabetic kidney complication: Secondary | ICD-10-CM

## 2017-10-08 ENCOUNTER — Other Ambulatory Visit: Payer: Self-pay

## 2017-10-08 ENCOUNTER — Encounter: Payer: Self-pay | Admitting: Physician Assistant

## 2017-10-08 ENCOUNTER — Ambulatory Visit: Payer: Managed Care, Other (non HMO) | Admitting: Physician Assistant

## 2017-10-08 VITALS — BP 133/88 | HR 89 | Wt 251.0 lb

## 2017-10-08 DIAGNOSIS — R1013 Epigastric pain: Secondary | ICD-10-CM | POA: Diagnosis not present

## 2017-10-08 DIAGNOSIS — E785 Hyperlipidemia, unspecified: Secondary | ICD-10-CM

## 2017-10-08 DIAGNOSIS — IMO0002 Reserved for concepts with insufficient information to code with codable children: Secondary | ICD-10-CM

## 2017-10-08 DIAGNOSIS — E1165 Type 2 diabetes mellitus with hyperglycemia: Secondary | ICD-10-CM | POA: Diagnosis not present

## 2017-10-08 DIAGNOSIS — G4719 Other hypersomnia: Secondary | ICD-10-CM

## 2017-10-08 DIAGNOSIS — Z1329 Encounter for screening for other suspected endocrine disorder: Secondary | ICD-10-CM

## 2017-10-08 DIAGNOSIS — R0683 Snoring: Secondary | ICD-10-CM | POA: Diagnosis not present

## 2017-10-08 DIAGNOSIS — Z130 Encounter for screening for diseases of the blood and blood-forming organs and certain disorders involving the immune mechanism: Secondary | ICD-10-CM

## 2017-10-08 DIAGNOSIS — R809 Proteinuria, unspecified: Secondary | ICD-10-CM

## 2017-10-08 DIAGNOSIS — Z1321 Encounter for screening for nutritional disorder: Secondary | ICD-10-CM

## 2017-10-08 DIAGNOSIS — E1129 Type 2 diabetes mellitus with other diabetic kidney complication: Secondary | ICD-10-CM | POA: Diagnosis not present

## 2017-10-08 DIAGNOSIS — Z9189 Other specified personal risk factors, not elsewhere classified: Secondary | ICD-10-CM | POA: Diagnosis not present

## 2017-10-08 DIAGNOSIS — E559 Vitamin D deficiency, unspecified: Secondary | ICD-10-CM

## 2017-10-08 DIAGNOSIS — Z79899 Other long term (current) drug therapy: Secondary | ICD-10-CM

## 2017-10-08 MED ORDER — OMEPRAZOLE 20 MG PO CPDR: 20.0000 mg | DELAYED_RELEASE_CAPSULE | Freq: Every day | ORAL | 0 refills | Status: DC

## 2017-10-08 NOTE — Progress Notes (Signed)
HPI:                                                                Albert [name] is a 47 y.o. male who presents to [facility]: [facility] today for medication management  Current concerns include: snoring  DMII: Metformin reduced to 500 mg with breakfast 2 months ago due to intolerance. Reports he never continued at lower dose. Refused to take [place] after reading about potential adverse effects. He is compliant with Trulicity, but having severe, daily dyspepsia. Has been taking Zantac w/minimal relief Lab Results  Component Value Date   HGBA1C 7.9 08/13/2017  Checks blood sugars at home. Blood sugar range 115-130. Denies hypoglycemic events. Denies polydipsia, polyuria, polyphagia. Denies blurred vision or vision change. Denies extremity pain, altered sensation and paresthesias.  Denies ulcers/wounds on feet.  Girlfriend also reports loud snoring. Does not report choking or pauses. Patient states he never feels rested despite adequate sleep. Denies orthopnea or PND. Concerned about sleep apnea.   Depression screen [facility] 2/9 10/08/2017 02/18/2017 06/29/2016  Decreased Interest 0 0 3  Down, Depressed, Hopeless 0 0 0  PHQ - 2 Score 0 0 3  Altered sleeping - 1 3  Tired, decreased energy - 0 2  Change in appetite - 0 0  Feeling bad or failure about yourself  - 0 0  Trouble concentrating - 1 3  Moving slowly or fidgety/restless - 0 0  Suicidal thoughts - 0 0  PHQ-9 Score - 2 11    GAD 7 : Generalized Anxiety Score 10/08/2017 02/18/2017 08/03/2016 06/29/2016  Nervous, Anxious, on Edge 1 1 1 3   Control/stop worrying 1 1 1 2   Worry too much - different things 1 1 2 2   Trouble relaxing 1 1 2 2   Restless 1 0 0 0  Easily annoyed or irritable 0 0 0 1  Afraid - awful might happen 1 0 1 1  Total GAD 7 Score 6 4 7 11       Past Medical History:  Diagnosis Date  . ADHD   . Anxiety   . Depression   . Diabetes mellitus without complication ([place])   .  Dyslipidemia associated with type 2 diabetes mellitus ([place]) 01/21/2017  . Erectile dysfunction 08/04/2016  . Insomnia 06/29/2016  . Low libido 08/04/2016  . Microalbuminuria   . Shoulder dislocation 2008   Left shoulder  . Social anxiety disorder   . Tobacco use disorder    Past Surgical History:  Procedure Laterality Date  . NO PAST SURGERIES     Social History   Tobacco Use  . Smoking status: Former Smoker    Packs/day: 1.00    Years: 25.00    Pack years: 25.00    Types: Cigarettes    Last attempt to quit: 04/23/2017    Years since quitting: 0.4  . Smokeless tobacco: Never Used  Substance Use Topics  . Alcohol use: No    Comment: Drank 6-10 beers. Last drank during Christmas   family history includes Breast cancer in his paternal grandfather and paternal grandmother; Diabetes type II in his maternal grandmother and mother; Hypertension in his maternal grandmother, mother, and sister.    ROS: negative except as noted in the HPI  Medications:  Current Outpatient Medications  Medication Sig Dispense Refill  . albuterol (PROVENTIL HFA;VENTOLIN HFA) 108 (90 Base) MCG/ACT inhaler Inhale 1-2 puffs into the lungs every 4 (four) hours as needed for wheezing or shortness of breath. 1 Inhaler 0  . AMBULATORY NON FORMULARY MEDICATION Single glucometer with lancets, test strips.  Check morning fasting blood sugar daily 1 each 0  . aspirin EC 81 MG tablet Take 1 tablet (81 mg total) by mouth daily. 90 tablet 3  . atorvastatin (LIPITOR) 10 MG tablet Take 1 tablet (10 mg total) by mouth daily. 90 tablet 1  . Blood Glucose Monitoring Suppl (ONE TOUCH ULTRA 2) w/Device KIT Inject as directed daily.  0  . cetirizine (ZYRTEC) 10 MG tablet Take 1 tablet (10 mg total) by mouth daily. 30 tablet 11  . Dulaglutide (TRULICITY) 5.63 SL/3.7DS SOPN Inject 0.5 mLs into the skin once a week. 1 pen 3  . Ergocalciferol 2000 units CAPS Take 1 capsule by mouth daily.    Marland Kitchen escitalopram (LEXAPRO) 20 MG  tablet Take 1 tablet (20 mg total) by mouth daily. 30 tablet 4  . ipratropium (ATROVENT) 0.06 % nasal spray Place 1-2 sprays into both nostrils 4 (four) times daily as needed. 15 mL 2  . lisinopril (PRINIVIL,ZESTRIL) 10 MG tablet TAKE 1 TABLET BY MOUTH EVERY DAY 30 tablet 2  . ONE TOUCH ULTRA TEST test strip TEST DAILY AS DIRECTED 100 each 0  . ONETOUCH DELICA LANCETS 28J MISC TEST DAILY AS DIRECTED  0  . omeprazole (PRILOSEC) 20 MG capsule Take 1-2 capsules (20-40 mg total) by mouth daily for 15 days. 30 capsule 0   No current facility-administered medications for this visit.    No Known Allergies     Objective:  BP 133/88   Pulse 89   Wt 251 lb (113.9 kg)   BMI 34.04 kg/m  Gen:  alert, not ill-appearing, no distress, appropriate for age, obese male HEENT: head normocephalic without obvious abnormality, conjunctiva and cornea clear, trachea midline Pulm: Normal work of breathing, normal phonation, clear to auscultation bilaterally, no wheezes, rales or rhonchi CV: Normal rate, regular rhythm, s1 and s2 distinct, no murmurs, clicks or rubs  Neuro: alert and oriented x 3, no tremor MSK: extremities atraumatic, normal gait and station Skin: intact, no rashes on exposed skin, no jaundice, no cyanosis Psych: well-groomed, cooperative, good eye contact, euthymic mood, affect mood-congruent, speech is articulate, and thought processes clear and goal-directed    No results found for this or any previous visit (from the past 72 hour(s)). No results found.    Assessment and Plan: 47 y.o. male with   1. Dyspepsia - 2/2 GLP-1 antagonist. He is having good glycemic control with this medication. We will see if we can control dyspepsia with PPI and lifestyle modification. - start Pantoprazole 20 mg 1-2 times daily for 2 weeks. Send MyChart message in 2 weeks to update me on symptoms.   2. Snoring - Home sleep test; Future  3. Excessive daytime sleepiness - Home sleep test;  Future  4. At risk for obstructive sleep apnea - Positive STOPBANG: +male, +snoring, +excessive daytime sleepiness, +HTN  - Home sleep test; Future  5. Uncontrolled Type 2 Diabetes w/microalbuminuria Lab Results  Component Value Date   HGBA1C 7.9 08/13/2017  - continue Tresiba. Encouraged to start metformin 500 mg with meal. He does not want to do SGLT-2 therapy. If A1c is not at goal in May, will add DPP4 (Januvia) - cont ACE and statin  Patient education and anticipatory guidance given Patient agrees with treatment plan Follow-up in 1 month for fasting labs/ diabetes follow-up or sooner as needed if symptoms worsen or fail to improve  Darlyne Russian PA-C

## 2017-10-08 NOTE — Patient Instructions (Signed)
- Omeprazole for acid reflux: start 1 capsule daily. If symptoms are not controlled increase to 1 twice a day - Follow a GERD diet - Do this for 2 weeks and send me a MyChart message    Food Choices for Gastroesophageal Reflux Disease, Adult When you have gastroesophageal reflux disease (GERD), the foods you eat and your eating habits are very important. Choosing the right foods can help ease the discomfort of GERD. Consider working with a diet and nutrition specialist (dietitian) to help you make healthy food choices. What general guidelines should I follow? Eating plan  Choose healthy foods low in fat, such as fruits, vegetables, whole grains, low-fat dairy products, and lean meat, fish, and poultry.  Eat frequent, small meals instead of three large meals each day. Eat your meals slowly, in a relaxed setting. Avoid bending over or lying down until 2-3 hours after eating.  Limit high-fat foods such as fatty meats or fried foods.  Limit your intake of oils, butter, and shortening to less than 8 teaspoons each day.  Avoid the following: ? Foods that cause symptoms. These may be different for different people. Keep a food diary to keep track of foods that cause symptoms. ? Alcohol. ? Drinking large amounts of liquid with meals. ? Eating meals during the 2-3 hours before bed.  Cook foods using methods other than frying. This may include baking, grilling, or broiling. Lifestyle   Maintain a healthy weight. Ask your health care provider what weight is healthy for you. If you need to lose weight, work with your health care provider to do so safely.  Exercise for at least 30 minutes on 5 or more days each week, or as told by your health care provider.  Avoid wearing clothes that fit tightly around your waist and chest.  Do not use any products that contain nicotine or tobacco, such as cigarettes and e-cigarettes. If you need help quitting, ask your health care provider.  Sleep with the  head of your bed raised. Use a wedge under the mattress or blocks under the bed frame to raise the head of the bed. What foods are not recommended? The items listed may not be a complete list. Talk with your dietitian about what dietary choices are best for you. Grains Pastries or quick breads with added fat. [place] toast. Vegetables Deep fried vegetables. [place] fries. Any vegetables prepared with added fat. Any vegetables that cause symptoms. For some people this may include tomatoes and tomato products, chili peppers, onions and garlic, and horseradish. Fruits Any fruits prepared with added fat. Any fruits that cause symptoms. For some people this may include citrus fruits, such as oranges, grapefruit, pineapple, and lemons. Meats and other protein foods High-fat meats, such as fatty beef or pork, hot dogs, ribs, ham, sausage, salami and bacon. Fried meat or protein, including fried fish and fried chicken. Nuts and nut butters. Dairy Whole milk and chocolate milk. Sour cream. Cream. Ice cream. Cream cheese. Milk shakes. Beverages Coffee and tea, with or without caffeine. Carbonated beverages. Sodas. Energy drinks. Fruit juice made with acidic fruits (such as orange or grapefruit). Tomato juice. Alcoholic drinks. Fats and oils Butter. Margarine. Shortening. Ghee. Sweets and desserts Chocolate and cocoa. Donuts. Seasoning and other foods Pepper. Peppermint and spearmint. Any condiments, herbs, or seasonings that cause symptoms. For some people, this may include curry, hot sauce, or vinegar-based salad dressings. Summary  When you have gastroesophageal reflux disease (GERD), food and lifestyle choices are very important to  help ease the discomfort of GERD.  Eat frequent, small meals instead of three large meals each day. Eat your meals slowly, in a relaxed setting. Avoid bending over or lying down until 2-3 hours after eating.  Limit high-fat foods such as fatty meat or fried foods. This  information is not intended to replace advice given to you by your health care provider. Make sure you discuss any questions you have with your health care provider. Document Released: 06/29/2005 Document Revised: 06/30/2016 Document Reviewed: 06/30/2016 Elsevier Interactive Patient Education  [employer].

## 2017-10-12 ENCOUNTER — Other Ambulatory Visit: Payer: Self-pay | Admitting: Physician Assistant

## 2017-10-12 ENCOUNTER — Encounter: Payer: Self-pay | Admitting: Physician Assistant

## 2017-10-12 ENCOUNTER — Other Ambulatory Visit: Payer: Self-pay

## 2017-10-12 DIAGNOSIS — R1013 Epigastric pain: Secondary | ICD-10-CM | POA: Insufficient documentation

## 2017-10-12 DIAGNOSIS — J3489 Other specified disorders of nose and nasal sinuses: Secondary | ICD-10-CM

## 2017-10-12 MED ORDER — PANTOPRAZOLE SODIUM 20 MG PO TBEC: 20.0000 mg | DELAYED_RELEASE_TABLET | Freq: Two times a day (BID) | ORAL | 0 refills | Status: DC

## 2017-10-12 NOTE — Progress Notes (Signed)
p 

## 2017-10-13 ENCOUNTER — Other Ambulatory Visit: Payer: Self-pay | Admitting: Physician Assistant

## 2017-10-13 MED ORDER — LANSOPRAZOLE 15 MG PO CPDR: 15.0000 mg | DELAYED_RELEASE_CAPSULE | Freq: Every day | ORAL | 3 refills | Status: DC

## 2017-10-27 ENCOUNTER — Encounter: Payer: Self-pay | Admitting: Physician Assistant

## 2017-10-27 DIAGNOSIS — F4010 Social phobia, unspecified: Secondary | ICD-10-CM

## 2017-10-27 MED ORDER — LORAZEPAM 0.5 MG PO TABS: 0.5000 mg | ORAL_TABLET | Freq: Three times a day (TID) | ORAL | 0 refills | Status: DC | PRN

## 2017-11-04 ENCOUNTER — Other Ambulatory Visit: Payer: Self-pay | Admitting: Physician Assistant

## 2017-11-11 ENCOUNTER — Encounter: Payer: Self-pay | Admitting: Physician Assistant

## 2017-11-18 ENCOUNTER — Encounter: Payer: Self-pay | Admitting: Physician Assistant

## 2017-11-28 ENCOUNTER — Other Ambulatory Visit: Payer: Self-pay | Admitting: Physician Assistant

## 2017-11-28 DIAGNOSIS — E1129 Type 2 diabetes mellitus with other diabetic kidney complication: Secondary | ICD-10-CM

## 2017-11-28 DIAGNOSIS — R809 Proteinuria, unspecified: Principal | ICD-10-CM

## 2017-11-28 DIAGNOSIS — E1165 Type 2 diabetes mellitus with hyperglycemia: Principal | ICD-10-CM

## 2017-11-28 DIAGNOSIS — IMO0002 Reserved for concepts with insufficient information to code with codable children: Secondary | ICD-10-CM

## 2017-11-30 ENCOUNTER — Other Ambulatory Visit: Payer: Self-pay | Admitting: Physician Assistant

## 2017-11-30 DIAGNOSIS — F4010 Social phobia, unspecified: Secondary | ICD-10-CM

## 2017-12-30 ENCOUNTER — Other Ambulatory Visit: Payer: Self-pay | Admitting: Physician Assistant

## 2017-12-30 DIAGNOSIS — F4010 Social phobia, unspecified: Secondary | ICD-10-CM

## 2018-01-01 ENCOUNTER — Other Ambulatory Visit: Payer: Self-pay | Admitting: Physician Assistant

## 2018-01-01 DIAGNOSIS — IMO0002 Reserved for concepts with insufficient information to code with codable children: Secondary | ICD-10-CM

## 2018-01-01 DIAGNOSIS — E1129 Type 2 diabetes mellitus with other diabetic kidney complication: Secondary | ICD-10-CM

## 2018-01-01 DIAGNOSIS — E1165 Type 2 diabetes mellitus with hyperglycemia: Principal | ICD-10-CM

## 2018-01-01 DIAGNOSIS — R809 Proteinuria, unspecified: Principal | ICD-10-CM

## 2018-01-02 ENCOUNTER — Other Ambulatory Visit: Payer: Self-pay | Admitting: Physician Assistant

## 2018-01-02 DIAGNOSIS — E1129 Type 2 diabetes mellitus with other diabetic kidney complication: Secondary | ICD-10-CM

## 2018-01-02 DIAGNOSIS — R809 Proteinuria, unspecified: Principal | ICD-10-CM

## 2018-01-02 DIAGNOSIS — E1165 Type 2 diabetes mellitus with hyperglycemia: Principal | ICD-10-CM

## 2018-01-02 DIAGNOSIS — IMO0002 Reserved for concepts with insufficient information to code with codable children: Secondary | ICD-10-CM

## 2018-01-03 ENCOUNTER — Other Ambulatory Visit: Payer: Self-pay | Admitting: Physician Assistant

## 2018-01-03 DIAGNOSIS — E1165 Type 2 diabetes mellitus with hyperglycemia: Principal | ICD-10-CM

## 2018-01-03 DIAGNOSIS — E1129 Type 2 diabetes mellitus with other diabetic kidney complication: Secondary | ICD-10-CM

## 2018-01-03 DIAGNOSIS — R809 Proteinuria, unspecified: Principal | ICD-10-CM

## 2018-01-03 DIAGNOSIS — IMO0002 Reserved for concepts with insufficient information to code with codable children: Secondary | ICD-10-CM

## 2018-01-18 ENCOUNTER — Encounter: Payer: Self-pay | Admitting: Physician Assistant

## 2018-01-18 ENCOUNTER — Other Ambulatory Visit: Payer: Self-pay | Admitting: Physician Assistant

## 2018-01-18 DIAGNOSIS — IMO0002 Reserved for concepts with insufficient information to code with codable children: Secondary | ICD-10-CM

## 2018-01-18 DIAGNOSIS — E1129 Type 2 diabetes mellitus with other diabetic kidney complication: Secondary | ICD-10-CM

## 2018-01-18 DIAGNOSIS — R809 Proteinuria, unspecified: Principal | ICD-10-CM

## 2018-01-18 DIAGNOSIS — E1165 Type 2 diabetes mellitus with hyperglycemia: Principal | ICD-10-CM

## 2018-01-18 MED ORDER — LISINOPRIL 10 MG PO TABS: 10.0000 mg | ORAL_TABLET | Freq: Every day | ORAL | 0 refills | Status: DC

## 2018-02-02 ENCOUNTER — Other Ambulatory Visit: Payer: Self-pay | Admitting: Physician Assistant

## 2018-02-02 DIAGNOSIS — E1129 Type 2 diabetes mellitus with other diabetic kidney complication: Secondary | ICD-10-CM

## 2018-02-02 DIAGNOSIS — R809 Proteinuria, unspecified: Principal | ICD-10-CM

## 2018-02-02 DIAGNOSIS — IMO0002 Reserved for concepts with insufficient information to code with codable children: Secondary | ICD-10-CM

## 2018-02-02 DIAGNOSIS — E1165 Type 2 diabetes mellitus with hyperglycemia: Principal | ICD-10-CM

## 2018-02-02 DIAGNOSIS — F4010 Social phobia, unspecified: Secondary | ICD-10-CM

## 2018-02-09 ENCOUNTER — Ambulatory Visit: Payer: Managed Care, Other (non HMO) | Admitting: Physician Assistant

## 2018-02-18 ENCOUNTER — Encounter: Payer: Self-pay | Admitting: Physician Assistant

## 2018-03-01 ENCOUNTER — Other Ambulatory Visit: Payer: Self-pay | Admitting: Physician Assistant

## 2018-03-01 DIAGNOSIS — E1165 Type 2 diabetes mellitus with hyperglycemia: Principal | ICD-10-CM

## 2018-03-01 DIAGNOSIS — E1129 Type 2 diabetes mellitus with other diabetic kidney complication: Secondary | ICD-10-CM

## 2018-03-01 DIAGNOSIS — R809 Proteinuria, unspecified: Principal | ICD-10-CM

## 2018-03-01 DIAGNOSIS — IMO0002 Reserved for concepts with insufficient information to code with codable children: Secondary | ICD-10-CM

## 2018-03-05 ENCOUNTER — Other Ambulatory Visit: Payer: Self-pay | Admitting: Physician Assistant

## 2018-03-05 DIAGNOSIS — E1165 Type 2 diabetes mellitus with hyperglycemia: Principal | ICD-10-CM

## 2018-03-05 DIAGNOSIS — E1129 Type 2 diabetes mellitus with other diabetic kidney complication: Secondary | ICD-10-CM

## 2018-03-05 DIAGNOSIS — IMO0002 Reserved for concepts with insufficient information to code with codable children: Secondary | ICD-10-CM

## 2018-03-05 DIAGNOSIS — R809 Proteinuria, unspecified: Principal | ICD-10-CM

## 2018-03-16 ENCOUNTER — Ambulatory Visit: Payer: Managed Care, Other (non HMO) | Admitting: Physician Assistant

## 2018-03-16 ENCOUNTER — Encounter: Payer: Self-pay | Admitting: Physician Assistant

## 2018-03-16 VITALS — BP 123/78 | HR 101 | Wt 258.0 lb

## 2018-03-16 DIAGNOSIS — E1165 Type 2 diabetes mellitus with hyperglycemia: Secondary | ICD-10-CM

## 2018-03-16 DIAGNOSIS — R002 Palpitations: Secondary | ICD-10-CM | POA: Diagnosis not present

## 2018-03-16 DIAGNOSIS — Z79899 Other long term (current) drug therapy: Secondary | ICD-10-CM | POA: Diagnosis not present

## 2018-03-16 DIAGNOSIS — E1129 Type 2 diabetes mellitus with other diabetic kidney complication: Secondary | ICD-10-CM

## 2018-03-16 DIAGNOSIS — IMO0002 Reserved for concepts with insufficient information to code with codable children: Secondary | ICD-10-CM

## 2018-03-16 DIAGNOSIS — E785 Hyperlipidemia, unspecified: Secondary | ICD-10-CM

## 2018-03-16 DIAGNOSIS — F17200 Nicotine dependence, unspecified, uncomplicated: Secondary | ICD-10-CM

## 2018-03-16 DIAGNOSIS — R1013 Epigastric pain: Secondary | ICD-10-CM

## 2018-03-16 DIAGNOSIS — E1169 Type 2 diabetes mellitus with other specified complication: Secondary | ICD-10-CM

## 2018-03-16 DIAGNOSIS — R809 Proteinuria, unspecified: Secondary | ICD-10-CM | POA: Diagnosis not present

## 2018-03-16 DIAGNOSIS — F4010 Social phobia, unspecified: Secondary | ICD-10-CM

## 2018-03-16 LAB — POCT GLYCOSYLATED HEMOGLOBIN (HGB A1C): HbA1c, POC (controlled diabetic range): 8.4 % — AB (ref 0.0–7.0)

## 2018-03-16 NOTE — Patient Instructions (Addendum)
Increase your Trulicity to 1 ml (1.5 mg) once a week Increase your Metformin to 2 tablets with a meal each day Reduce your dietary carbohydrates (45g/meal)  Diabetes Preventive Care: - annual foot exam  - annual dilated eye exam with an eye doctor - self foot exams at least weekly - pneumonia vaccine once (booster in 5 years and at age 47) - annual influenza vaccine - twice yearly dental cleanings and yearly exam - goal blood pressure <140/90, ideally <130/80 - LDL cholesterol <70 - A1C <6.2 - body mass index (BMI) <25.0 - follow-up every 3 months if your A1C is not at goal - follow-up every 6 months if diabetes is well controlled

## 2018-03-16 NOTE — Progress Notes (Signed)
HPI:                                                                Albert [name] is a 47 y.o. male who presents to [place]: [facility] today for medication management  DMII: Has not been compliant with medications. Frequently misses Metformin dose because he reports he does not eat breakfast and it upsets his stomach. Ran out of Trulicity. Denies polydipsia, polyuria, polyphagia. Denies blurred vision or vision change. Recent eye exam with [facility], Rondall Allegra. Denies extremity pain, altered sensation and paresthesias.  Denies ulcers/wounds on feet. Hx of DKA/HHS: none Diabetes associated symptoms: none Blood glucose readings: 120-150 Hypoglycemia frequency: none Severe hypoglycemia (requiring 3rd party assistance): none  Anxiety: he was well controlled on Lexapro 20 mg and Lorazepam 0.5 mg prn. Reports he lost his prescription 3 weeks ago and has been off of his medication since. Has been having increased nervousness, excessive worry,  Reports new onset "heart flutters" beginning last night at rest. At the time symptoms started he was talking to his girlfriend on the phone regarding plans for their upcoming move. Palpitations lasted from 8p-3a. Associated with "heaviness" of his extremities and "dizziness" with position changes. Reports increased stress at work and stress related to moving in with girlfriend.  Denies chest pain  He quit smoking cigarettes 5 months ago and is currently vaping.    Depression screen [facility] 2/9 03/16/2018 10/08/2017 02/18/2017 06/29/2016  Decreased Interest 0 0 0 3  Down, Depressed, Hopeless 0 0 0 0  PHQ - 2 Score 0 0 0 3  Altered sleeping 0 - 1 3  Tired, decreased energy 0 - 0 2  Change in appetite 0 - 0 0  Feeling bad or failure about yourself  0 - 0 0  Trouble concentrating 0 - 1 3  Moving slowly or fidgety/restless 0 - 0 0  Suicidal thoughts 0 - 0 0  PHQ-9 Score 0 - 2 11  Difficult doing work/chores Not  difficult at all - - -    GAD 7 : Generalized Anxiety Score 03/16/2018 10/08/2017 02/18/2017 08/03/2016  Nervous, Anxious, on Edge _0 Control/stop worrying _1 Worry too much - different things _2 Trouble relaxing _3 Restless 1 1 0 0  Easily annoyed or irritable 2 0 0 0  Afraid - awful might happen 1 1 0 1  Total GAD 7 Score _4 Anxiety Difficulty Very difficult - - -      Past Medical History:  Diagnosis Date  . ADHD   . Anxiety   . Depression   . Diabetes mellitus without complication ([place])   . Dyslipidemia associated with type 2 diabetes mellitus ([place]) 01/21/2017  . Erectile dysfunction 08/04/2016  . Insomnia 06/29/2016  . Low libido 08/04/2016  . Microalbuminuria   . Shoulder dislocation 2008   Left shoulder  . Social anxiety disorder   . Tobacco use disorder    Past Surgical History:  Procedure Laterality Date  . NO PAST SURGERIES     Social History   Tobacco Use  . Smoking status: Former Smoker    Packs/day: 1.00    Years:  25.00    Pack years: 25.00    Types: Cigarettes    Last attempt to quit: 04/23/2017    Years since quitting: 0.8  . Smokeless tobacco: Never Used  Substance Use Topics  . Alcohol use: No    Comment: Drank 6-10 beers. Last drank during Christmas   family history includes Breast cancer in his paternal grandfather and paternal grandmother; Diabetes type II in his maternal grandmother and mother; Hypertension in his maternal grandmother, mother, and sister.    ROS: negative except as noted in the HPI  Medications: Current Outpatient Medications  Medication Sig Dispense Refill  . albuterol (PROVENTIL HFA;VENTOLIN HFA) 108 (90 Base) MCG/ACT inhaler Inhale 1-2 puffs into the lungs every 4 (four) hours as needed for wheezing or shortness of breath. 1 Inhaler 0  . AMBULATORY NON FORMULARY MEDICATION Single glucometer with lancets, test strips.  Check morning fasting blood sugar daily 1 each 0  . aspirin EC 81 MG  tablet Take 1 tablet (81 mg total) by mouth daily. 90 tablet 3  . atorvastatin (LIPITOR) 10 MG tablet Take 1 tablet (10 mg total) by mouth daily. 90 tablet 1  . Blood Glucose Monitoring Suppl (ONE TOUCH ULTRA 2) w/Device KIT Inject as directed daily.  0  . cetirizine (ZYRTEC) 10 MG tablet Take 1 tablet (10 mg total) by mouth daily. 30 tablet 11  . Dulaglutide (TRULICITY) 1.61 WR/6.0AV SOPN Inject 1 mL into the skin once a week. 12 pen 1  . Ergocalciferol 2000 units CAPS Take 1 capsule by mouth daily.    Marland Kitchen escitalopram (LEXAPRO) 20 MG tablet Take 1 tablet (20 mg total) by mouth daily. 90 tablet 1  . ipratropium (ATROVENT) 0.06 % nasal spray PLACE 1-2 SPRAYS INTO BOTH NOSTRILS 4 (FOUR) TIMES DAILY AS NEEDED. 30 mL 5  . lansoprazole (PREVACID) 15 MG capsule Take 1 capsule (15 mg total) by mouth daily before breakfast. 90 capsule 1  . lisinopril (PRINIVIL,ZESTRIL) 10 MG tablet Take 1 tablet (10 mg total) by mouth daily. 90 tablet 1  . LORazepam (ATIVAN) 0.5 MG tablet TAKE 1 TABLET (0.5 MG TOTAL) BY MOUTH EVERY 8 (EIGHT) HOURS AS NEEDED FOR ANXIETY 20 tablet 2  . metFORMIN (GLUCOPHAGE) 500 MG tablet Take 1 tablet (500 mg total) by mouth daily with lunch. 90 tablet 1  . ONE TOUCH ULTRA TEST test strip TEST DAILY AS DIRECTED 100 each 0  . ONETOUCH DELICA LANCETS 40J MISC TEST DAILY AS DIRECTED  0   No current facility-administered medications for this visit.    No Known Allergies     Objective:  BP 123/78   Pulse (!) 101   Wt 258 lb (117 kg)   BMI 34.99 kg/m  Gen:  alert, not ill-appearing, no distress, appropriate for age, obese male HEENT: head normocephalic without obvious abnormality, conjunctiva and cornea clear, trachea midline Pulm: Normal work of breathing, normal phonation, clear to auscultation bilaterally, no wheezes, rales or rhonchi CV: Normal rate at 96 bpm, regular rhythm, s1 and s2 distinct, no murmurs, clicks or rubs  Neuro: alert and oriented x 3, no tremor MSK:  extremities atraumatic, normal gait and station Skin: intact, no rashes on exposed skin, no jaundice, no cyanosis Psych: appearance casual, cooperative, good eye contact, euthymic mood, appears anxious, affect full-range, speech is articulate, and thought processes clear and goal-directed  Diabetic Foot Exam - Simple   Simple Foot Form Diabetic Foot exam was performed with the following findings:  Yes 03/16/2018 10:29 AM  Visual  Inspection No deformities, no ulcerations, no other skin breakdown bilaterally:  Yes Sensation Testing Intact to touch and monofilament testing bilaterally:  Yes Pulse Check Posterior Tibialis and Dorsalis pulse intact bilaterally:  Yes Comments      Results for orders placed or performed in visit on 03/16/18 (from the past 72 hour(s))  POCT HgB A1C     Status: Abnormal   Collection Time: 03/16/18  8:36 AM  Result Value Ref Range   Hemoglobin A1C     HbA1c POC (<> result, manual entry)     HbA1c, POC (prediabetic range)     HbA1c, POC (controlled diabetic range) 8.4 (A) 0.0 - 7.0 %   No results found.    Assessment and Plan: 47 y.o. male with   .Wyman was seen today for diabetes.  Diagnoses and all orders for this visit:  Encounter for long-term (current) use of medications  Social anxiety disorder -     escitalopram (LEXAPRO) 20 MG tablet; Take 1 tablet (20 mg total) by mouth daily.  Uncontrolled type 2 diabetes mellitus with microalbuminuria, without long-term current use of insulin (HCC) -     metFORMIN (GLUCOPHAGE) 500 MG tablet; Take 1 tablet (500 mg total) by mouth daily with lunch. -     Dulaglutide (TRULICITY) 3.47 QQ/5.9DG SOPN; Inject 1 mL into the skin once a week. -     POCT HgB A1C -     lisinopril (PRINIVIL,ZESTRIL) 10 MG tablet; Take 1 tablet (10 mg total) by mouth daily.  Palpitations  Dyslipidemia, goal LDL below 70 -     atorvastatin (LIPITOR) 10 MG tablet; Take 1 tablet (10 mg total) by mouth daily.  Dyslipidemia  associated with type 2 diabetes mellitus (HCC) -     atorvastatin (LIPITOR) 10 MG tablet; Take 1 tablet (10 mg total) by mouth daily.  Dyspepsia -     lansoprazole (PREVACID) 15 MG capsule; Take 1 capsule (15 mg total) by mouth daily before breakfast.  Nicotine dependence, uncomplicated, unspecified nicotine product type   All medication refills pending today. Patient is overdue for his routine labs. Should have been completed in March  Type 2 Diabetes  POC A1C 8.4 today Poorly controlled 2/2 medication noncompliance Increasing Metformin to 1000 mg with a meal. Patient instructed that he can take this with any meal Increasing Trulicity to 1.5 mg weekly Counseled on reducing carbohydrates to 45g/meal BP at goal <130/80, cont ACE Fasting lipids pending, LDL goal <70 Foot exam performed today and normal Requesting DM eye exam from [facility] Pneumovax UTD Declined influenza  Social Anxiety Disorder GAD7=16 Self-discontinued his Lexapro abruptly without taper 3 weeks ago. Re-starting SSRI  Palpitations - HR 95-101 in office today, normal heart sounds - patient declined ECG today - suspect this is 2/2 Lexapro withdrawal and poorly controlled anxiety - counseled on general measures, reducing nicotine, avoiding/limiting caffeine, stress management   Patient education and anticipatory guidance given Patient agrees with treatment plan Follow-up in 3 months for repeat A1C or sooner as needed if symptoms worsen or fail to improve  Darlyne Russian PA-C

## 2018-03-17 ENCOUNTER — Other Ambulatory Visit: Payer: Self-pay | Admitting: Physician Assistant

## 2018-03-17 ENCOUNTER — Encounter: Payer: Self-pay | Admitting: Physician Assistant

## 2018-03-17 DIAGNOSIS — E1165 Type 2 diabetes mellitus with hyperglycemia: Principal | ICD-10-CM

## 2018-03-17 DIAGNOSIS — E1129 Type 2 diabetes mellitus with other diabetic kidney complication: Secondary | ICD-10-CM

## 2018-03-17 DIAGNOSIS — R809 Proteinuria, unspecified: Principal | ICD-10-CM

## 2018-03-17 DIAGNOSIS — R1013 Epigastric pain: Secondary | ICD-10-CM

## 2018-03-17 DIAGNOSIS — IMO0002 Reserved for concepts with insufficient information to code with codable children: Secondary | ICD-10-CM

## 2018-03-17 LAB — CBC
HCT: 42.4 % (ref 38.5–50.0)
HEMOGLOBIN: 14.5 g/dL (ref 13.2–17.1)
MCH: 32.7 pg (ref 27.0–33.0)
MCHC: 34.2 g/dL (ref 32.0–36.0)
MCV: 95.5 fL (ref 80.0–100.0)
MPV: 10.1 fL (ref 7.5–12.5)
Platelets: 217 10*3/uL (ref 140–400)
RBC: 4.44 10*6/uL (ref 4.20–5.80)
RDW: 11.7 % (ref 11.0–15.0)
WBC: 6.9 10*3/uL (ref 3.8–10.8)

## 2018-03-17 LAB — COMPLETE METABOLIC PANEL WITH GFR
AG RATIO: 2.4 (calc) (ref 1.0–2.5)
ALBUMIN MSPROF: 4.8 g/dL (ref 3.6–5.1)
ALKALINE PHOSPHATASE (APISO): 75 U/L (ref 40–115)
ALT: 41 U/L (ref 9–46)
AST: 26 U/L (ref 10–40)
BILIRUBIN TOTAL: 0.7 mg/dL (ref 0.2–1.2)
BUN / CREAT RATIO: 26 (calc) — AB (ref 6–22)
BUN: 32 mg/dL — ABNORMAL HIGH (ref 7–25)
CHLORIDE: 99 mmol/L (ref 98–110)
CO2: 25 mmol/L (ref 20–32)
Calcium: 9.3 mg/dL (ref 8.6–10.3)
Creat: 1.25 mg/dL (ref 0.60–1.35)
GFR, EST AFRICAN AMERICAN: 80 mL/min/{1.73_m2} (ref >=60)
GFR, Est Non African American: 69 mL/min/{1.73_m2} (ref >=60)
GLOBULIN: 2 g/dL (ref 1.9–3.7)
GLUCOSE: 242 mg/dL — AB (ref 65–99)
POTASSIUM: 4 mmol/L (ref 3.5–5.3)
SODIUM: 136 mmol/L (ref 135–146)
TOTAL PROTEIN: 6.8 g/dL (ref 6.1–8.1)

## 2018-03-17 LAB — VITAMIN B12: Vitamin B-12: 295 pg/mL (ref 200–1100)

## 2018-03-17 LAB — LIPID PANEL W/REFLEX DIRECT LDL
CHOLESTEROL: 101 mg/dL (ref <200)
HDL: 36 mg/dL — ABNORMAL LOW (ref >40)
LDL Cholesterol (Calc): 40 mg/dL (calc)
Non-HDL Cholesterol (Calc): 65 mg/dL (calc) (ref <130)
Total CHOL/HDL Ratio: 2.8 (calc) (ref <5.0)
Triglycerides: 177 mg/dL — ABNORMAL HIGH (ref <150)

## 2018-03-17 LAB — VITAMIN D 25 HYDROXY (VIT D DEFICIENCY, FRACTURES): VIT D 25 HYDROXY: 29 ng/mL — AB (ref 30–100)

## 2018-03-17 LAB — TSH+FREE T4: TSH W/REFLEX TO FT4: 1.74 m[IU]/L (ref 0.40–4.50)

## 2018-03-17 MED ORDER — LISINOPRIL 10 MG PO TABS: 10.0000 mg | ORAL_TABLET | Freq: Every day | ORAL | 1 refills | Status: DC

## 2018-03-17 MED ORDER — ATORVASTATIN CALCIUM 10 MG PO TABS: 10.0000 mg | ORAL_TABLET | Freq: Every day | ORAL | 1 refills | Status: DC

## 2018-03-17 MED ORDER — METFORMIN HCL 500 MG PO TABS: 500.0000 mg | ORAL_TABLET | Freq: Every day | ORAL | 1 refills | Status: DC

## 2018-03-17 MED ORDER — ESCITALOPRAM OXALATE 20 MG PO TABS: 20.0000 mg | ORAL_TABLET | Freq: Every day | ORAL | 1 refills | Status: DC

## 2018-03-17 MED ORDER — DULAGLUTIDE 1.5 MG/0.5ML ~~LOC~~ SOAJ: 1.5000 mg | SUBCUTANEOUS | 0 refills | Status: DC

## 2018-03-17 MED ORDER — DULAGLUTIDE 0.75 MG/0.5ML ~~LOC~~ SOAJ: 1.0000 mL | SUBCUTANEOUS | 1 refills | Status: DC

## 2018-03-17 MED ORDER — LANSOPRAZOLE 15 MG PO CPDR: 15.0000 mg | DELAYED_RELEASE_CAPSULE | Freq: Every day | ORAL | 1 refills | Status: DC

## 2018-03-23 ENCOUNTER — Other Ambulatory Visit: Payer: Self-pay | Admitting: Physician Assistant

## 2018-03-23 DIAGNOSIS — R1013 Epigastric pain: Secondary | ICD-10-CM

## 2018-05-04 ENCOUNTER — Encounter: Payer: Self-pay | Admitting: Physician Assistant

## 2018-05-04 MED ORDER — DULAGLUTIDE 1.5 MG/0.5ML ~~LOC~~ SOAJ: 1.5000 mg | SUBCUTANEOUS | 0 refills | Status: DC

## 2018-05-13 ENCOUNTER — Ambulatory Visit: Payer: Managed Care, Other (non HMO) | Admitting: Physician Assistant

## 2018-05-13 ENCOUNTER — Encounter: Payer: Self-pay | Admitting: Physician Assistant

## 2018-05-19 ENCOUNTER — Ambulatory Visit: Payer: Managed Care, Other (non HMO) | Admitting: Physician Assistant

## 2018-05-19 ENCOUNTER — Encounter: Payer: Self-pay | Admitting: Physician Assistant

## 2018-05-19 VITALS — BP 158/98 | HR 90 | Wt 257.0 lb

## 2018-05-19 DIAGNOSIS — E1129 Type 2 diabetes mellitus with other diabetic kidney complication: Secondary | ICD-10-CM

## 2018-05-19 DIAGNOSIS — IMO0002 Reserved for concepts with insufficient information to code with codable children: Secondary | ICD-10-CM

## 2018-05-19 DIAGNOSIS — F4010 Social phobia, unspecified: Secondary | ICD-10-CM

## 2018-05-19 DIAGNOSIS — F4001 Agoraphobia with panic disorder: Secondary | ICD-10-CM

## 2018-05-19 DIAGNOSIS — E1165 Type 2 diabetes mellitus with hyperglycemia: Secondary | ICD-10-CM

## 2018-05-19 DIAGNOSIS — R110 Nausea: Secondary | ICD-10-CM

## 2018-05-19 DIAGNOSIS — R809 Proteinuria, unspecified: Secondary | ICD-10-CM

## 2018-05-19 MED ORDER — ONDANSETRON HCL 4 MG PO TABS: 4.0000 mg | ORAL_TABLET | Freq: Three times a day (TID) | ORAL | 0 refills | Status: DC | PRN

## 2018-05-19 MED ORDER — SITAGLIPTIN PHOSPHATE 50 MG PO TABS: 50.0000 mg | ORAL_TABLET | Freq: Every day | ORAL | 1 refills | Status: DC

## 2018-05-19 MED ORDER — CLONAZEPAM 1 MG PO TABS: 0.5000 mg | ORAL_TABLET | Freq: Two times a day (BID) | ORAL | 0 refills | Status: DC | PRN

## 2018-05-19 NOTE — Progress Notes (Signed)
HPI:                                                                Albert [name] is a 47 y.o. male who presents to [place]: [place] today for medication management  Requesting intermittent FMLA for social anxiety, nausea and diabetes. He states he missed about 10 days of work in the last 3 months due to his medical conditions.  He reports he can't get over the nausea with Metformin and discontinued this about 1 week ago.   Also reports that Lexapro is not controlling his anxiety. He self-discontinued his medication, but anxiety was even worse, so he re-started this several days ago. He is taking 2-3 Lorazepam per day to function. He is having frequent panic attacks. He actually had a panic attack in our waiting room last week and left before his appointment.  Depression screen [facility] 2/9 05/19/2018 03/16/2018 10/08/2017 02/18/2017 06/29/2016  Decreased Interest 1 0 0 0 3  Down, Depressed, Hopeless 1 0 0 0 0  PHQ - 2 Score 2 0 0 0 3  Altered sleeping 2 0 - 1 3  Tired, decreased energy 1 0 - 0 2  Change in appetite 2 0 - 0 0  Feeling bad or failure about yourself  0 0 - 0 0  Trouble concentrating 2 0 - 1 3  Moving slowly or fidgety/restless 0 0 - 0 0  Suicidal thoughts 0 0 - 0 0  PHQ-9 Score 9 0 - 2 11  Difficult doing work/chores Somewhat difficult Not difficult at all - - -    GAD 7 : Generalized Anxiety Score 05/19/2018 03/16/2018 10/08/2017 02/18/2017  Nervous, Anxious, on Edge 2 3 1 1   Control/stop worrying 1 3 1 1   Worry too much - different things 0 3 1 1   Trouble relaxing 2 3 1 1   Restless 1 1 1  0  Easily annoyed or irritable 0 2 0 0  Afraid - awful might happen 1 1 1  0  Total GAD 7 Score 7 16 6 4   Anxiety Difficulty Very difficult Very difficult - -      Past Medical History:  Diagnosis Date  . ADHD   . Anxiety   . Depression   . Diabetes mellitus without complication ([place])   . Dyslipidemia associated with type 2 diabetes mellitus  ([place]) 01/21/2017  . Erectile dysfunction 08/04/2016  . Insomnia 06/29/2016  . Low libido 08/04/2016  . Microalbuminuria   . Shoulder dislocation 2008   Left shoulder  . Social anxiety disorder   . Tobacco use disorder    Past Surgical History:  Procedure Laterality Date  . NO PAST SURGERIES     Social History   Tobacco Use  . Smoking status: Former Smoker    Packs/day: 1.00    Years: 25.00    Pack years: 25.00    Types: Cigarettes    Last attempt to quit: 04/23/2017    Years since quitting: 1.0  . Smokeless tobacco: Never Used  Substance Use Topics  . Alcohol use: No    Comment: Drank 6-10 beers. Last drank during Christmas   family history includes Breast cancer in his paternal grandfather and paternal grandmother; Diabetes type II in his maternal grandmother  and mother; Hypertension in his maternal grandmother, mother, and sister.    ROS: negative except as noted in the HPI  Medications: Current Outpatient Medications  Medication Sig Dispense Refill  . AMBULATORY NON FORMULARY MEDICATION Single glucometer with lancets, test strips.  Check morning fasting blood sugar daily 1 each 0  . aspirin EC 81 MG tablet Take 1 tablet (81 mg total) by mouth daily. 90 tablet 3  . atorvastatin (LIPITOR) 10 MG tablet Take 1 tablet (10 mg total) by mouth daily. 90 tablet 1  . Blood Glucose Monitoring Suppl (ONE TOUCH ULTRA 2) w/Device KIT Inject as directed daily.  0  . cetirizine (ZYRTEC) 10 MG tablet Take 1 tablet (10 mg total) by mouth daily. 30 tablet 11  . clonazePAM (KLONOPIN) 1 MG tablet Take 0.5-1 tablets (0.5-1 mg total) by mouth 2 (two) times daily as needed for anxiety. 30 tablet 0  . Dulaglutide (TRULICITY) 1.5 NG/2.9BM SOPN Inject 1.5 mg into the skin once a week. 12 pen 0  . Ergocalciferol 2000 units CAPS Take 1 capsule by mouth daily.    Marland Kitchen escitalopram (LEXAPRO) 20 MG tablet Take 1 tablet (20 mg total) by mouth daily. 90 tablet 1  . ipratropium (ATROVENT) 0.06 % nasal  spray PLACE 1-2 SPRAYS INTO BOTH NOSTRILS 4 (FOUR) TIMES DAILY AS NEEDED. 30 mL 5  . lansoprazole (PREVACID SOLUTAB) 15 MG disintegrating tablet Take 1 tablet (15 mg total) by mouth daily before breakfast. 90 tablet 01  . lisinopril (PRINIVIL,ZESTRIL) 10 MG tablet Take 1 tablet (10 mg total) by mouth daily. 90 tablet 1  . ondansetron (ZOFRAN) 4 MG tablet Take 1 tablet (4 mg total) by mouth every 8 (eight) hours as needed for nausea or vomiting. 20 tablet 0  . ONE TOUCH ULTRA TEST test strip TEST DAILY AS DIRECTED 100 each 0  . ONETOUCH DELICA LANCETS 84X MISC TEST DAILY AS DIRECTED  0  . sitaGLIPtin (JANUVIA) 50 MG tablet Take 1 tablet (50 mg total) by mouth daily. 90 tablet 1   No current facility-administered medications for this visit.    No Known Allergies     Objective:  BP (!) 158/98   Pulse 90   Wt 257 lb (116.6 kg)   BMI 34.86 kg/m  Gen:  alert, not ill-appearing, no distress, appropriate for age 74: head normocephalic without obvious abnormality, conjunctiva and cornea clear, trachea midline Pulm: Normal work of breathing, normal phonation Neuro: alert and oriented x 3, no tremor MSK: extremities atraumatic, normal gait and station Skin: intact, no rashes on exposed skin, no jaundice, no cyanosis Psych: appearance casual, cooperative, good eye contact, anxious mood, affect mood-congruent, speech is articulate, and thought processes clear and goal-directed, no SI/HI    No results found for this or any previous visit (from the past 65 hour(s)). No results found.    Assessment and Plan: 47 y.o. male with   .[name] was seen today for anxiety and fmla.  Diagnoses and all orders for this visit:  Nausea -     ondansetron (ZOFRAN) 4 MG tablet; Take 1 tablet (4 mg total) by mouth every 8 (eight) hours as needed for nausea or vomiting.  Panic disorder with agoraphobia -     clonazePAM (KLONOPIN) 1 MG tablet; Take 0.5-1 tablets (0.5-1 mg total) by mouth 2 (two) times  daily as needed for anxiety.  Social anxiety disorder -     clonazePAM (KLONOPIN) 1 MG tablet; Take 0.5-1 tablets (0.5-1 mg total) by mouth 2 (two) times  daily as needed for anxiety.  Uncontrolled type 2 diabetes mellitus with microalbuminuria, without long-term current use of insulin (HCC) -     sitaGLIPtin (JANUVIA) 50 MG tablet; Take 1 tablet (50 mg total) by mouth daily.   Panic disorder, Agoraphobia, SAD GAD7=7, causing significant distress Cont Lexapro 20 mg QD for now. Plan to taper At this point he has failed Zoloft, Paxil, Lexapro. [facility] psychotropic test was ordered today to guide treatment D/c Lorazepam. Switch to Clonazepam. Instructed to limit this to 1 tab bid prn He will send a MyChart message in 2 weeks and we will try to limit his in-office visits to his diabetes follow-ups every 3-6 months  Nausea - exacerbated by Metformin and uncontrolled anxiety. Zofran prn. Switching to Januvia.  FMLA documentation completed for intermittent leave.  Patient education and anticipatory guidance given Patient agrees with treatment plan Follow-up in 1 month or sooner as needed if symptoms worsen or fail to improve  Darlyne Russian PA-C

## 2018-05-19 NOTE — Patient Instructions (Signed)
Agoraphobia Agoraphobia is a mental health disorder. It is a type of anxiety or fear. People with agoraphobia fear public places where they may be trapped, helpless, or embarrassed in the event of a panic attack or a loss of control. They often start to avoid the feared situations or insist that another person go with them. Agoraphobia may interfere with normal daily activities and personal relationships. People with severe agoraphobia may become completely homebound and dependent on others for grocery shopping and other errands. Agoraphobia usually begins before age 35, but it can start in the older adult years. People with agoraphobia are at risk for other anxiety disorders, depression, and substance abuse. What are the causes? It is not known exactly what causes agoraphobia. What increases the risk? Agoraphobia is more common in women. People who have panic disorder or have family members with agoraphobia are at higher risk of developing agoraphobia. What are the signs or symptoms? You may have agoraphobia if you have the following symptoms for 6 months or longer:  Intense fear about two or more of the following: ? Using public transportation, such as cars, buses, planes, trains, or ships. ? Being in open spaces, such as parking lots, shopping malls, or bridges. ? Being in enclosed spaces, such as shops, theaters, or elevators. ? Standing in line or being in a crowd. ? Being outside the home alone.  Fear that is due to thoughts of being unable to escape or get help if certain events occur. The feared event may be a panic attack or panic-like symptoms, such as a racing heart, dizziness, and trouble breathing. In older people, the feared event may be a fall or loss of bowel control.  Reacting to feared situations by: ? Avoiding them. ? Requiring the presence of a companion. ? Enduring them with intense fear or anxiety.  Fear or anxiety that is out of proportion to the actual danger that is  posed by the event and the situation.  How is this diagnosed? Agoraphobia may be diagnosed by your health care provider. You will be asked questions about your fears and how they have affected you. You may be asked about your medical history and your use of medicines, alcohol, or drugs. Your health care provider may do a physical exam and order lab tests or other studies to rule out a medical condition. You may also be referred to a mental health specialist. How is this treated? Treatment usually includes a combination of counseling and medicines.  Counseling or talk therapy. Talk therapy is provided by mental health specialists. The following forms of talk therapy can be especially helpful: ? Cognitive therapy. Cognitive therapy helps you to recognize and change unrealistic thoughts and beliefs that contribute to your fears. ? Exposure therapy. Exposure therapy helps you to face and overcome your fears in a relaxed state and a safe environment.  Medicines. The following types of medicines may be helpful: ? Antidepressants. Antidepressants are believed to affect certain chemicals in your brain. They can decrease general levels of anxiety and can help to prevent panic attacks. ? Benzodiazepines. These medicines block feelings of anxiety and panic. They are very effective and act more quickly than antidepressants, but they are highly addictive. These medicines are recommended only for short-term use. ? Beta blockers. Beta blockers can reduce physical symptoms of anxiety, such as a racing heart, sweating, and tremor. They may help you to feel less tense and anxious.  Follow these instructions at home:  Keep all follow-up visits as   directed by your health care provider. This is important.  Take all medicines only as directed by your health care provider.  Try to exercise, eat a healthy diet, and get plenty of sleep.  Do not drink alcohol.  Do not use illegal drugs. Where to find more  information: For more information, visit the website of the Anxiety and Depression Association of America (ADAA): www.adaa.org Contact a health care provider if:  Your fear or anxiety gets worse.  You have new fears or anxieties. Get help right away if:  You have serious thoughts about hurting yourself or someone else.  You have trouble breathing or have chest pain. This information is not intended to replace advice given to you by your health care provider. Make sure you discuss any questions you have with your health care provider. Document Released: 11/19/2010 Document Revised: 12/05/2015 Document Reviewed: 10/30/2013 Elsevier Interactive Patient Education  2018 Elsevier Inc.  

## 2018-05-20 ENCOUNTER — Telehealth: Payer: Self-pay | Admitting: Physician Assistant

## 2018-05-20 NOTE — Telephone Encounter (Signed)
FMLA forms completed and ready to fax to [phone]

## 2018-05-20 NOTE — Telephone Encounter (Signed)
Froms faxed sent to scan -EH/RMA

## 2018-05-22 ENCOUNTER — Encounter: Payer: Self-pay | Admitting: Physician Assistant

## 2018-05-24 ENCOUNTER — Telehealth: Payer: Self-pay | Admitting: Physician Assistant

## 2018-05-24 ENCOUNTER — Encounter: Payer: Self-pay | Admitting: Physician Assistant

## 2018-05-24 DIAGNOSIS — F4010 Social phobia, unspecified: Secondary | ICD-10-CM

## 2018-05-24 DIAGNOSIS — F4001 Agoraphobia with panic disorder: Secondary | ICD-10-CM

## 2018-05-24 NOTE — Telephone Encounter (Signed)
Called and spoke with patient regarding [facility] Psychotropic pharmaco genomic test results. Of the antidepressant options and to treat patient's panic disorder I would recommend considering clomipramine, imipramine, desvenlafaxine, duloxetine, or fluvoxamine.  Patient requested that I send him a my chart message with these medication names so that he could do his own research and get back to me.

## 2018-05-25 MED ORDER — DULOXETINE HCL 20 MG PO CPEP: ORAL_CAPSULE | ORAL | 1 refills | Status: DC

## 2018-06-15 ENCOUNTER — Encounter: Payer: Self-pay | Admitting: Physician Assistant

## 2018-06-15 ENCOUNTER — Ambulatory Visit: Payer: Managed Care, Other (non HMO) | Admitting: Physician Assistant

## 2018-06-15 VITALS — BP 153/96 | HR 106 | Resp 14 | Wt 258.0 lb

## 2018-06-15 DIAGNOSIS — I10 Essential (primary) hypertension: Secondary | ICD-10-CM

## 2018-06-15 DIAGNOSIS — E785 Hyperlipidemia, unspecified: Secondary | ICD-10-CM | POA: Diagnosis not present

## 2018-06-15 DIAGNOSIS — R000 Tachycardia, unspecified: Secondary | ICD-10-CM | POA: Insufficient documentation

## 2018-06-15 DIAGNOSIS — K521 Toxic gastroenteritis and colitis: Secondary | ICD-10-CM

## 2018-06-15 DIAGNOSIS — I152 Hypertension secondary to endocrine disorders: Secondary | ICD-10-CM | POA: Insufficient documentation

## 2018-06-15 DIAGNOSIS — IMO0002 Reserved for concepts with insufficient information to code with codable children: Secondary | ICD-10-CM

## 2018-06-15 DIAGNOSIS — E1165 Type 2 diabetes mellitus with hyperglycemia: Secondary | ICD-10-CM | POA: Diagnosis not present

## 2018-06-15 DIAGNOSIS — E1129 Type 2 diabetes mellitus with other diabetic kidney complication: Secondary | ICD-10-CM

## 2018-06-15 DIAGNOSIS — E1159 Type 2 diabetes mellitus with other circulatory complications: Secondary | ICD-10-CM

## 2018-06-15 DIAGNOSIS — E1169 Type 2 diabetes mellitus with other specified complication: Secondary | ICD-10-CM

## 2018-06-15 DIAGNOSIS — R110 Nausea: Secondary | ICD-10-CM | POA: Insufficient documentation

## 2018-06-15 DIAGNOSIS — F17290 Nicotine dependence, other tobacco product, uncomplicated: Secondary | ICD-10-CM

## 2018-06-15 DIAGNOSIS — R809 Proteinuria, unspecified: Secondary | ICD-10-CM

## 2018-06-15 LAB — POCT GLYCOSYLATED HEMOGLOBIN (HGB A1C): HbA1c, POC (controlled diabetic range): 8.3 % — AB (ref 0.0–7.0)

## 2018-06-15 MED ORDER — LOPERAMIDE HCL 2 MG PO TABS: 2.0000 mg | ORAL_TABLET | Freq: Four times a day (QID) | ORAL | 0 refills | Status: DC | PRN

## 2018-06-15 MED ORDER — SITAGLIPTIN PHOSPHATE 100 MG PO TABS: 100.0000 mg | ORAL_TABLET | Freq: Every day | ORAL | 1 refills | Status: DC

## 2018-06-15 MED ORDER — ONDANSETRON HCL 4 MG PO TABS: 4.0000 mg | ORAL_TABLET | Freq: Three times a day (TID) | ORAL | 0 refills | Status: DC | PRN

## 2018-06-15 NOTE — Progress Notes (Signed)
HPI:                                                                Shandy Vi is a 47 y.o. male who presents to [place]: [place] today for diabetes and anxiety follow-up  DMII: taking Januvia 50 mg and Trulicity 6.5KC. Compliant with medications. Admits diet has been poor with the holidays. He also is currently emotionally eating due to recent breakup. Denies polydipsia, polyuria, polyphagia. Denies blurred vision or vision change. Denies extremity pain, altered sensation and paresthesias.  Denies ulcers/wounds on feet.   Anxiety: He was taking Duloxetine 20 mg for about 10 days. Reports it was helping with his anxiety. Unfortunately,he was having frequent loose stools and fecal urgency with Duloxetine and self-discontinued this on Sunday. Symptoms resolved yesterday. Last episode of diarrhea was Monday. He is taking zofran every morning before work for nausea. He is not nauseated, but taking it to prevent nausea.   Depression screen [facility] [place] 2/9 05/19/2018 03/16/2018 10/08/2017 02/18/2017 06/29/2016  Decreased Interest 1 0 0 0 3  Down, Depressed, Hopeless 1 0 0 0 0  PHQ - 2 Score 2 0 0 0 3  Altered sleeping 2 0 - 1 3  Tired, decreased energy 1 0 - 0 2  Change in appetite 2 0 - 0 0  Feeling bad or failure about yourself  0 0 - 0 0  Trouble concentrating 2 0 - 1 3  Moving slowly or fidgety/restless 0 0 - 0 0  Suicidal thoughts 0 0 - 0 0  PHQ-9 Score 9 0 - 2 11  Difficult doing work/chores Somewhat difficult Not difficult at all - - -    GAD 7 : Generalized Anxiety Score 05/19/2018 03/16/2018 10/08/2017 02/18/2017  Nervous, Anxious, on Edge 2 3 1 1   Control/stop worrying 1 3 1 1   Worry too much - different things 0 3 1 1   Trouble relaxing 2 3 1 1   Restless 1 1 1  0  Easily annoyed or irritable 0 2 0 0  Afraid - awful might happen 1 1 1  0  Total GAD 7 Score 7 16 6 4   Anxiety Difficulty Very difficult Very difficult - -      Past Medical History:   Diagnosis Date  . ADHD   . Anxiety   . Depression   . Diabetes mellitus without complication ([place])   . Dyslipidemia associated with type 2 diabetes mellitus ([place]) 01/21/2017  . Erectile dysfunction 08/04/2016  . Insomnia 06/29/2016  . Low libido 08/04/2016  . Microalbuminuria   . Shoulder dislocation 2008   Left shoulder  . Social anxiety disorder   . Tobacco use disorder    Past Surgical History:  Procedure Laterality Date  . NO PAST SURGERIES     Social History   Tobacco Use  . Smoking status: Former Smoker    Packs/day: 1.00    Years: 25.00    Pack years: 25.00    Types: Cigarettes    Last attempt to quit: 04/23/2017    Years since quitting: 1.1  . Smokeless tobacco: Never Used  Substance Use Topics  . Alcohol use: No    Comment: Drank 6-10 beers. Last drank during Christmas   family history includes Breast cancer  in his paternal grandfather and paternal grandmother; Diabetes type II in his maternal grandmother and mother; Hypertension in his maternal grandmother, mother, and sister.    ROS: negative except as noted in the HPI  Medications: Current Outpatient Medications  Medication Sig Dispense Refill  . AMBULATORY NON FORMULARY MEDICATION Single glucometer with lancets, test strips.  Check morning fasting blood sugar daily 1 each 0  . aspirin EC 81 MG tablet Take 1 tablet (81 mg total) by mouth daily. 90 tablet 3  . atorvastatin (LIPITOR) 10 MG tablet Take 1 tablet (10 mg total) by mouth daily. 90 tablet 1  . Blood Glucose Monitoring Suppl (ONE TOUCH ULTRA 2) w/Device KIT Inject as directed daily.  0  . clonazePAM (KLONOPIN) 1 MG tablet Take 0.5-1 tablets (0.5-1 mg total) by mouth 2 (two) times daily as needed for anxiety. 30 tablet 0  . Dulaglutide (TRULICITY) 1.5 NI/6.2VO SOPN Inject 1.5 mg into the skin once a week. 12 pen 0  . DULoxetine (CYMBALTA) 20 MG capsule Take 1 capsule (20 mg total) by mouth at bedtime for 7 days, THEN 2 capsules (40 mg total) at  bedtime for 23 days. 60 capsule 1  . lisinopril (PRINIVIL,ZESTRIL) 10 MG tablet Take 1 tablet (10 mg total) by mouth daily. 90 tablet 1  . loperamide (IMODIUM A-D) 2 MG tablet Take 1 tablet (2 mg total) by mouth 4 (four) times daily as needed for diarrhea or loose stools. 30 tablet 0  . ondansetron (ZOFRAN) 4 MG tablet Take 1 tablet (4 mg total) by mouth every 8 (eight) hours as needed for nausea or vomiting. 20 tablet 0  . ONE TOUCH ULTRA TEST test strip TEST DAILY AS DIRECTED 100 each 0  . ONETOUCH DELICA LANCETS 35K MISC TEST DAILY AS DIRECTED  0  . sitaGLIPtin (JANUVIA) 100 MG tablet Take 1 tablet (100 mg total) by mouth daily. 90 tablet 1   No current facility-administered medications for this visit.    Allergies  Allergen Reactions  . Metformin And Related Nausea Only       Objective:  BP (!) 153/96   Pulse (!) 106   Resp 14   Wt 258 lb (117 kg)   SpO2 98%   BMI 34.99 kg/m  Gen:  alert, not ill-appearing, no distress, appropriate for age, obese male HEENT: head normocephalic without obvious abnormality, conjunctiva and cornea clear, trachea midline Pulm: Normal work of breathing, normal phonation, clear to auscultation bilaterally, no wheezes, rales or rhonchi CV: mildly tachycardic rate, regular rhythm, s1 and s2 distinct, no murmurs, clicks or rubs  Neuro: alert and oriented x 3, no tremor MSK: extremities atraumatic, normal gait and station Skin: intact, no rashes on exposed skin, no jaundice, no cyanosis Psych: well-groomed, cooperative, good eye contact, euthymic mood, affect mood-congruent, speech is articulate, and thought processes clear and goal-directed  The ASCVD Risk score Mikey Bussing DC Jr., et al., 2013) failed to calculate for the following reasons:   The valid total cholesterol range is 130 to 320 mg/dL   Results for orders placed or performed in visit on 06/15/18 (from the past 72 hour(s))  POCT HgB A1C     Status: Abnormal   Collection Time: 06/15/18 11:18  AM  Result Value Ref Range   Hemoglobin A1C     HbA1c POC (<> result, manual entry)     HbA1c, POC (prediabetic range)     HbA1c, POC (controlled diabetic range) 8.3 (A) 0.0 - 7.0 %   No results found.  Assessment and Plan: 47 y.o. male with   .Michel was seen today for hyperglycemia.  Diagnoses and all orders for this visit:  Uncontrolled type 2 diabetes mellitus with microalbuminuria, without long-term current use of insulin (HCC) -     POCT HgB A1C -     sitaGLIPtin (JANUVIA) 100 MG tablet; Take 1 tablet (100 mg total) by mouth daily.  Dyslipidemia associated with type 2 diabetes mellitus (HCC) -     POCT HgB A1C  Hypertension associated with diabetes ([place])  Diarrhea due to drug -     loperamide (IMODIUM A-D) 2 MG tablet; Take 1 tablet (2 mg total) by mouth 4 (four) times daily as needed for diarrhea or loose stools.  Tachycardia with heart rate 100-120 beats per minute  Other tobacco product nicotine dependence, uncomplicated  Nausea -     ondansetron (ZOFRAN) 4 MG tablet; Take 1 tablet (4 mg total) by mouth every 8 (eight) hours as needed for nausea or vomiting.   Type 2 diabetes A1c is still not at goal.  Increasing Januvia 200 mg daily.  Continue Trulicity 1.5 mg weekly.  Work on reducing carbohydrate intake. LDL at goal <70, continue statin BP goal less than 130/80, continue ACE Eye exam up-to-date per patient Foot exam up-to-date Pneumovax up-to-date Declines influenza and Tdap vaccines  Tachycardia, hypertension - HR 105-108 in office today, rhythm sounds regular, asymptomatic - patient deferred ECG today -Blood pressure also noted to be elevated however patient has an anxiety disorder and likely white coat syndrome.  I have instructed him to purchase a blood pressure monitor.  I would like him to send me his blood pressure and heart rate readings in 1 week over my chart.  If he is still tachycardic we will work this up with EKG, Holter monitor, and  possibly echocardiogram.  Anxiety Recommend restarting Cymbalta 20 mg daily and remaining at this dose for the time being.  Recommend treating diarrhea with Imodium.  Send my chart message in 1 week or sooner if diarrhea is not controlled  Nausea Likely secondary to either Trulicity or uncontrolled anxiety.  Discussed that we should not continue Zofran indefinitely but okay to use for now until anxiety and diabetes are under better control.  Patient education and anticipatory guidance given Patient agrees with treatment plan Follow-up in 3 months or sooner as needed if symptoms worsen or fail to improve  Darlyne Russian PA-C

## 2018-06-15 NOTE — Patient Instructions (Addendum)
Send me a Clinical cytogeneticistMyChart message with your BP and HR in 1 week   Diabetes Preventive Care: LIMIT CARBS TO 45G/MEAL - annual foot exam  - annual dilated eye exam with an eye doctor - self foot exams at least weekly - pneumonia vaccine once (booster in 5 years and at age 47) - annual influenza vaccine - twice yearly dental cleanings and yearly exam - goal blood pressure <140/90, ideally <130/80 - LDL cholesterol <70 - A1C <1.6<7.0 - body mass index (BMI) <25.0 - follow-up every 3 months if your A1C is not at goal - follow-up every 6 months if diabetes is well controlled

## 2018-07-19 ENCOUNTER — Other Ambulatory Visit: Payer: Self-pay | Admitting: Physician Assistant

## 2018-07-19 DIAGNOSIS — F4001 Agoraphobia with panic disorder: Secondary | ICD-10-CM

## 2018-07-19 DIAGNOSIS — F4010 Social phobia, unspecified: Secondary | ICD-10-CM

## 2018-08-04 ENCOUNTER — Other Ambulatory Visit: Payer: Self-pay | Admitting: Physician Assistant

## 2018-08-04 DIAGNOSIS — F4010 Social phobia, unspecified: Secondary | ICD-10-CM

## 2018-08-04 DIAGNOSIS — F4001 Agoraphobia with panic disorder: Secondary | ICD-10-CM

## 2018-08-05 ENCOUNTER — Other Ambulatory Visit: Payer: Self-pay | Admitting: Physician Assistant

## 2018-08-05 ENCOUNTER — Encounter: Payer: Self-pay | Admitting: Physician Assistant

## 2018-08-05 DIAGNOSIS — E1165 Type 2 diabetes mellitus with hyperglycemia: Principal | ICD-10-CM

## 2018-08-05 DIAGNOSIS — E1129 Type 2 diabetes mellitus with other diabetic kidney complication: Secondary | ICD-10-CM

## 2018-08-05 DIAGNOSIS — R809 Proteinuria, unspecified: Principal | ICD-10-CM

## 2018-08-05 DIAGNOSIS — IMO0002 Reserved for concepts with insufficient information to code with codable children: Secondary | ICD-10-CM

## 2018-08-05 MED ORDER — LISINOPRIL 20 MG PO TABS: 20.0000 mg | ORAL_TABLET | Freq: Every day | ORAL | 0 refills | Status: DC

## 2018-09-03 ENCOUNTER — Other Ambulatory Visit: Payer: Self-pay | Admitting: Physician Assistant

## 2018-09-03 DIAGNOSIS — F4001 Agoraphobia with panic disorder: Secondary | ICD-10-CM

## 2018-09-03 DIAGNOSIS — F4010 Social phobia, unspecified: Secondary | ICD-10-CM

## 2018-09-14 ENCOUNTER — Ambulatory Visit: Payer: Self-pay | Admitting: Physician Assistant

## 2018-09-20 ENCOUNTER — Other Ambulatory Visit: Payer: Self-pay | Admitting: Physician Assistant

## 2018-09-20 DIAGNOSIS — F4010 Social phobia, unspecified: Secondary | ICD-10-CM

## 2018-09-20 DIAGNOSIS — F4001 Agoraphobia with panic disorder: Secondary | ICD-10-CM

## 2018-09-21 ENCOUNTER — Other Ambulatory Visit: Payer: Self-pay | Admitting: Physician Assistant

## 2018-09-21 DIAGNOSIS — F4001 Agoraphobia with panic disorder: Secondary | ICD-10-CM

## 2018-09-21 DIAGNOSIS — F4010 Social phobia, unspecified: Secondary | ICD-10-CM

## 2018-09-26 ENCOUNTER — Encounter: Payer: Self-pay | Admitting: Physician Assistant

## 2018-09-30 ENCOUNTER — Ambulatory Visit: Payer: 59 | Admitting: Physician Assistant

## 2018-10-14 ENCOUNTER — Other Ambulatory Visit: Payer: Self-pay | Admitting: Physician Assistant

## 2018-10-30 ENCOUNTER — Other Ambulatory Visit: Payer: Self-pay | Admitting: Physician Assistant

## 2018-10-30 DIAGNOSIS — E1165 Type 2 diabetes mellitus with hyperglycemia: Principal | ICD-10-CM

## 2018-10-30 DIAGNOSIS — F4010 Social phobia, unspecified: Secondary | ICD-10-CM

## 2018-10-30 DIAGNOSIS — E1129 Type 2 diabetes mellitus with other diabetic kidney complication: Secondary | ICD-10-CM

## 2018-10-30 DIAGNOSIS — R809 Proteinuria, unspecified: Secondary | ICD-10-CM

## 2018-10-30 DIAGNOSIS — F4001 Agoraphobia with panic disorder: Secondary | ICD-10-CM

## 2018-10-30 DIAGNOSIS — IMO0002 Reserved for concepts with insufficient information to code with codable children: Secondary | ICD-10-CM

## 2018-11-02 ENCOUNTER — Telehealth: Payer: Self-pay | Admitting: Sports Medicine

## 2018-11-02 ENCOUNTER — Other Ambulatory Visit: Payer: Self-pay | Admitting: Physician Assistant

## 2018-11-02 NOTE — Telephone Encounter (Signed)
Received fax from [facility] that Trulicity was approved from 11/02/2018 through 11/01/2021. Pharmacy notified and forms sent to scan.

## 2018-11-26 ENCOUNTER — Other Ambulatory Visit: Payer: Self-pay | Admitting: Physician Assistant

## 2018-11-26 DIAGNOSIS — IMO0002 Reserved for concepts with insufficient information to code with codable children: Secondary | ICD-10-CM

## 2018-11-26 DIAGNOSIS — E1129 Type 2 diabetes mellitus with other diabetic kidney complication: Secondary | ICD-10-CM

## 2018-12-04 ENCOUNTER — Other Ambulatory Visit: Payer: Self-pay | Admitting: Physician Assistant

## 2018-12-13 ENCOUNTER — Ambulatory Visit: Payer: 59 | Admitting: Physician Assistant

## 2018-12-13 ENCOUNTER — Encounter: Payer: Self-pay | Admitting: Physician Assistant

## 2018-12-13 VITALS — BP 131/86 | HR 94 | Temp 98.2°F | Wt 264.0 lb

## 2018-12-13 DIAGNOSIS — Z4802 Encounter for removal of sutures: Secondary | ICD-10-CM | POA: Diagnosis not present

## 2018-12-13 DIAGNOSIS — F4010 Social phobia, unspecified: Secondary | ICD-10-CM

## 2018-12-13 DIAGNOSIS — E1129 Type 2 diabetes mellitus with other diabetic kidney complication: Secondary | ICD-10-CM | POA: Diagnosis not present

## 2018-12-13 DIAGNOSIS — E1165 Type 2 diabetes mellitus with hyperglycemia: Secondary | ICD-10-CM

## 2018-12-13 DIAGNOSIS — IMO0002 Reserved for concepts with insufficient information to code with codable children: Secondary | ICD-10-CM

## 2018-12-13 DIAGNOSIS — R809 Proteinuria, unspecified: Secondary | ICD-10-CM

## 2018-12-13 DIAGNOSIS — E785 Hyperlipidemia, unspecified: Secondary | ICD-10-CM

## 2018-12-13 DIAGNOSIS — S61211D Laceration without foreign body of left index finger without damage to nail, subsequent encounter: Secondary | ICD-10-CM

## 2018-12-13 DIAGNOSIS — I10 Essential (primary) hypertension: Secondary | ICD-10-CM

## 2018-12-13 DIAGNOSIS — I152 Hypertension secondary to endocrine disorders: Secondary | ICD-10-CM

## 2018-12-13 DIAGNOSIS — E1159 Type 2 diabetes mellitus with other circulatory complications: Secondary | ICD-10-CM | POA: Diagnosis not present

## 2018-12-13 DIAGNOSIS — E1169 Type 2 diabetes mellitus with other specified complication: Secondary | ICD-10-CM

## 2018-12-13 DIAGNOSIS — F4001 Agoraphobia with panic disorder: Secondary | ICD-10-CM

## 2018-12-13 LAB — POCT GLYCOSYLATED HEMOGLOBIN (HGB A1C): HbA1c, POC (controlled diabetic range): 8.7 % — AB (ref 0.0–7.0)

## 2018-12-13 MED ORDER — DULAGLUTIDE 1.5 MG/0.5ML ~~LOC~~ SOAJ: 1.5000 mg | SUBCUTANEOUS | 1 refills | Status: DC

## 2018-12-13 MED ORDER — CLONAZEPAM 1 MG PO TABS: 1.0000 mg | ORAL_TABLET | Freq: Two times a day (BID) | ORAL | 2 refills | Status: DC | PRN

## 2018-12-13 MED ORDER — ATORVASTATIN CALCIUM 10 MG PO TABS: 10.0000 mg | ORAL_TABLET | Freq: Every day | ORAL | 1 refills | Status: DC

## 2018-12-13 MED ORDER — LISINOPRIL 20 MG PO TABS: 20.0000 mg | ORAL_TABLET | Freq: Every day | ORAL | 0 refills | Status: DC

## 2018-12-13 NOTE — Patient Instructions (Signed)
Diabetes Preventive Care: - annual foot exam  - annual dilated eye exam with an eye doctor - self foot exams at least weekly - pneumonia vaccine once (booster in 5 years and at age 48) - annual influenza vaccine - twice yearly dental cleanings and yearly exam - goal blood pressure <140/90, ideally <130/80 - LDL cholesterol <70 - A1C <7.0 - body mass index (BMI) <25.0 - follow-up every 3 months if your A1C is not at goal - follow-up every 6 months if diabetes is well controlled  

## 2018-12-13 NOTE — Progress Notes (Signed)
HPI:                                                                Albert [name] is a 48 y.o. male who presents to [place]: [place] today for suture removal / medical refills  Patient lacerated left index finger on a kitchen knife 1 week ago. Laceration was repaired at [facility] ED and Tdap updated 12/06/18. Completed Keflex. Denies pain, warmth, redness or drainage. He only has pain with accidentally bumping his finger.  Requesting refills of Clonazepam and trulicity.  Taking Clonazepam 2 mg as needed for social anxiety. Does not take this medication daily.  As far as diabetes, he was unable to afford Januvia and is only taking Trulicity 1.5 mg weekly.  Past Medical History:  Diagnosis Date  . ADHD   . Anxiety   . Depression   . Diabetes mellitus without complication ([place])   . Dyslipidemia associated with type 2 diabetes mellitus ([place]) 01/21/2017  . Erectile dysfunction 08/04/2016  . Insomnia 06/29/2016  . Low libido 08/04/2016  . Microalbuminuria   . Shoulder dislocation 2008   Left shoulder  . Social anxiety disorder   . Tobacco use disorder    Past Surgical History:  Procedure Laterality Date  . NO PAST SURGERIES     Social History   Tobacco Use  . Smoking status: Former Smoker    Packs/day: 1.00    Years: 25.00    Pack years: 25.00    Types: Cigarettes    Last attempt to quit: 04/23/2017    Years since quitting: 1.6  . Smokeless tobacco: Never Used  Substance Use Topics  . Alcohol use: No    Comment: Drank 6-10 beers. Last drank during Christmas   family history includes Breast cancer in his paternal grandfather and paternal grandmother; Diabetes type II in his maternal grandmother and mother; Hypertension in his maternal grandmother, mother, and sister.    ROS: negative except as noted in the HPI  Medications: Current Outpatient Medications  Medication Sig Dispense Refill  . AMBULATORY NON FORMULARY MEDICATION Single  glucometer with lancets, test strips.  Check morning fasting blood sugar daily 1 each 0  . aspirin EC 81 MG tablet Take 1 tablet (81 mg total) by mouth daily. 90 tablet 3  . atorvastatin (LIPITOR) 10 MG tablet Take 1 tablet (10 mg total) by mouth daily. 90 tablet 1  . Blood Glucose Monitoring Suppl (ONE TOUCH ULTRA 2) w/Device KIT Inject as directed daily.  0  . clonazePAM (KLONOPIN) 1 MG tablet Take 1 tablet (1 mg total) by mouth 2 (two) times daily as needed for anxiety. 30 tablet 2  . Dulaglutide (TRULICITY) 1.5 ES/9.2ZR SOPN Inject 1.5 mg into the skin once a week. 6 mL 1  . lisinopril (ZESTRIL) 20 MG tablet Take 1 tablet (20 mg total) by mouth daily. 90 tablet 0  . ONE TOUCH ULTRA TEST test strip TEST DAILY AS DIRECTED 100 each 0  . ONETOUCH DELICA LANCETS 00T MISC TEST DAILY AS DIRECTED  0   No current facility-administered medications for this visit.    Allergies  Allergen Reactions  . Metformin And Related Nausea Only       Objective:  BP 131/86   Pulse 94  Temp 98.2 F (36.8 C) (Oral)   Wt 264 lb (119.7 kg)   SpO2 98%   BMI 35.80 kg/m  Gen:  alert, not ill-appearing, no distress, appropriate for age 21: head normocephalic without obvious abnormality, conjunctiva and cornea clear, trachea midline Pulm: Normal work of breathing, normal phonation Neuro: alert and oriented x 3, no tremor MSK: extremities atraumatic, normal gait and station Skin: crescent shaped laceration of distal left index finger is healing well without redness, drainage or edema, mild bruising Psych: well-groomed, cooperative, good eye contact, euthymic mood, anxious affect, speech is articulate, thought processes clear and goal-directed  Removed 6 simple interrupted sutures from left distal index finger  Results for orders placed or performed in visit on 12/13/18 (from the past 72 hour(s))  POCT HgB A1C     Status: Abnormal   Collection Time: 12/13/18 10:05 AM  Result Value Ref Range    Hemoglobin A1C     HbA1c POC (<> result, manual entry)     HbA1c, POC (prediabetic range)     HbA1c, POC (controlled diabetic range) 8.7 (A) 0.0 - 7.0 %   No results found.    Assessment and Plan: 48 y.o. male with   .Albert [name] was seen today for suture / staple removal and hyperglycemia.  Diagnoses and all orders for this visit:  Laceration of left index finger without foreign body without damage to nail, subsequent encounter  Uncontrolled type 2 diabetes mellitus with microalbuminuria, without long-term current use of insulin (HCC) -     POCT HgB A1C -     Dulaglutide (TRULICITY) 1.5 XT/0.2IO SOPN; Inject 1.5 mg into the skin once a week. -     lisinopril (ZESTRIL) 20 MG tablet; Take 1 tablet (20 mg total) by mouth daily. -     COMPLETE METABOLIC PANEL WITH GFR  Visit for suture removal  Hypertension associated with diabetes ([place]) -     lisinopril (ZESTRIL) 20 MG tablet; Take 1 tablet (20 mg total) by mouth daily. -     COMPLETE METABOLIC PANEL WITH GFR  Panic disorder with agoraphobia -     clonazePAM (KLONOPIN) 1 MG tablet; Take 1 tablet (1 mg total) by mouth 2 (two) times daily as needed for anxiety.  Social anxiety disorder -     clonazePAM (KLONOPIN) 1 MG tablet; Take 1 tablet (1 mg total) by mouth 2 (two) times daily as needed for anxiety.  Dyslipidemia associated with type 2 diabetes mellitus (HCC) -     atorvastatin (LIPITOR) 10 MG tablet; Take 1 tablet (10 mg total) by mouth daily.  Suture removal Sutures removed in office today Laceration is healing well without evidence of infection Placed in bulky ace dressing to protect finger from accidental trauma  Uncontrolled Type 2 DM A1c goal <7.0 Encouraged him to contact his insurance regarding formulary for type 2 diabetes medication A1C has increased slightly, so he will need additional glycemic control He does not tolerate Metformin and could not afford Januvia Cont Trulicity 1.5 mg weekly  HTN BP nearly at  goal He is supposed to be taking Lisinopril 20 mg, but pharmacy has been auto-refilling 10 mg and that is what he is currently taking Increase Lisinopril to 20 mg QD CMP pending  Social anxiety Checked PDMP, last fill 11/10/18, no red flags Self-discontinued Cymbalta Refilled Clonazepam. Do not exceed 2 mg daily   Patient education and anticipatory guidance given Patient agrees with treatment plan Follow-up in 3 months or sooner as needed if symptoms worsen  or fail to improve  Darlyne Russian PA-C

## 2018-12-14 LAB — COMPLETE METABOLIC PANEL WITH GFR
AG Ratio: 1.9 (calc) (ref 1.0–2.5)
ALT: 67 U/L — ABNORMAL HIGH (ref 9–46)
AST: 36 U/L (ref 10–40)
Albumin: 4.6 g/dL (ref 3.6–5.1)
Alkaline phosphatase (APISO): 85 U/L (ref 36–130)
BUN: 17 mg/dL (ref 7–25)
CO2: 23 mmol/L (ref 20–32)
Calcium: 9.8 mg/dL (ref 8.6–10.3)
Chloride: 101 mmol/L (ref 98–110)
Creat: 0.85 mg/dL (ref 0.60–1.35)
GFR, Est African American: 121 mL/min/{1.73_m2} (ref >=60)
GFR, Est Non African American: 104 mL/min/{1.73_m2} (ref >=60)
Globulin: 2.4 g/dL (calc) (ref 1.9–3.7)
Glucose, Bld: 175 mg/dL — ABNORMAL HIGH (ref 65–99)
Potassium: 4 mmol/L (ref 3.5–5.3)
Sodium: 136 mmol/L (ref 135–146)
Total Bilirubin: 0.6 mg/dL (ref 0.2–1.2)
Total Protein: 7 g/dL (ref 6.1–8.1)

## 2018-12-15 ENCOUNTER — Encounter: Payer: Self-pay | Admitting: Physician Assistant

## 2018-12-20 ENCOUNTER — Encounter: Payer: Self-pay | Admitting: Physician Assistant

## 2018-12-26 ENCOUNTER — Encounter: Payer: Self-pay | Admitting: Physician Assistant

## 2018-12-27 MED ORDER — TADALAFIL 5 MG PO TABS: 5.0000 mg | ORAL_TABLET | Freq: Every day | ORAL | 11 refills | Status: DC | PRN

## 2019-01-02 ENCOUNTER — Encounter: Payer: Self-pay | Admitting: Physician Assistant

## 2019-01-04 ENCOUNTER — Encounter: Payer: Self-pay | Admitting: Physician Assistant

## 2019-01-04 DIAGNOSIS — E1129 Type 2 diabetes mellitus with other diabetic kidney complication: Secondary | ICD-10-CM

## 2019-01-04 DIAGNOSIS — IMO0002 Reserved for concepts with insufficient information to code with codable children: Secondary | ICD-10-CM

## 2019-01-05 ENCOUNTER — Encounter: Payer: Self-pay | Admitting: Physician Assistant

## 2019-01-05 ENCOUNTER — Other Ambulatory Visit: Payer: Self-pay

## 2019-01-05 ENCOUNTER — Ambulatory Visit (INDEPENDENT_AMBULATORY_CARE_PROVIDER_SITE_OTHER): Payer: 59 | Admitting: Physician Assistant

## 2019-01-05 VITALS — BP 171/97 | HR 121 | Resp 18 | Wt 264.0 lb

## 2019-01-05 DIAGNOSIS — IMO0002 Reserved for concepts with insufficient information to code with codable children: Secondary | ICD-10-CM

## 2019-01-05 DIAGNOSIS — R740 Nonspecific elevation of levels of transaminase and lactic acid dehydrogenase [LDH]: Secondary | ICD-10-CM

## 2019-01-05 DIAGNOSIS — R7401 Elevation of levels of liver transaminase levels: Secondary | ICD-10-CM

## 2019-01-05 DIAGNOSIS — E1165 Type 2 diabetes mellitus with hyperglycemia: Secondary | ICD-10-CM

## 2019-01-05 DIAGNOSIS — R809 Proteinuria, unspecified: Secondary | ICD-10-CM

## 2019-01-05 DIAGNOSIS — E1129 Type 2 diabetes mellitus with other diabetic kidney complication: Secondary | ICD-10-CM

## 2019-01-05 DIAGNOSIS — J989 Respiratory disorder, unspecified: Secondary | ICD-10-CM

## 2019-01-05 DIAGNOSIS — R000 Tachycardia, unspecified: Secondary | ICD-10-CM

## 2019-01-05 DIAGNOSIS — R6889 Other general symptoms and signs: Secondary | ICD-10-CM

## 2019-01-05 DIAGNOSIS — I10 Essential (primary) hypertension: Secondary | ICD-10-CM

## 2019-01-05 DIAGNOSIS — Z20822 Contact with and (suspected) exposure to covid-19: Secondary | ICD-10-CM

## 2019-01-05 MED ORDER — INSULIN DETEMIR 100 UNIT/ML FLEXPEN: 10.0000 [IU] | PEN_INJECTOR | Freq: Every evening | SUBCUTANEOUS | 0 refills | Status: DC

## 2019-01-05 MED ORDER — SITAGLIPTIN PHOSPHATE 100 MG PO TABS: 100.0000 mg | ORAL_TABLET | Freq: Every day | ORAL | 2 refills | Status: DC

## 2019-01-05 MED ORDER — NOVOFINE 32G X 6 MM MISC: 1 refills | Status: DC

## 2019-01-05 NOTE — Progress Notes (Signed)
HPI:                                                                Albert [name] is a 48 y.o. male who presents to [place]: [place] today for hyperglycemia and cough/SOB  Patient reports new onset malaise, nightsweats, "mild" shortness of breath x 4 days. He also has a cough, but he has had this cough for several weeks and attributed it to allergies. Denies chest pain or palpitations. Reports co-worker tested positive for COVID-19 on 12/14/18 and he does not know if he had contact with this person.   Reports home blood sugar has also been increased the last 4 days.  Morning fasting glucose  6/22 426 6/23 375 6/25 261  Depression screen [facility] 2/9 01/05/2019 05/19/2018 03/16/2018 10/08/2017 02/18/2017  Decreased Interest 2 1 0 0 0  Down, Depressed, Hopeless 1 1 0 0 0  PHQ - 2 Score 3 2 0 0 0  Altered sleeping 3 2 0 - 1  Tired, decreased energy 3 1 0 - 0  Change in appetite 1 2 0 - 0  Feeling bad or failure about yourself  1 0 0 - 0  Trouble concentrating 1 2 0 - 1  Moving slowly or fidgety/restless 0 0 0 - 0  Suicidal thoughts 0 0 0 - 0  PHQ-9 Score 12 9 0 - 2  Difficult doing work/chores - Somewhat difficult Not difficult at all - -    GAD 7 : Generalized Anxiety Score 01/05/2019 05/19/2018 03/16/2018 10/08/2017  Nervous, Anxious, on Edge _0 Control/stop worrying 0 _1 Worry too much - different things 0 0 3 1  Trouble relaxing _2 Restless _3 Easily annoyed or irritable 2 0 2 0  Afraid - awful might happen 0 _4 Total GAD 7 Score _5 Anxiety Difficulty - Very difficult Very difficult -      Past Medical History:  Diagnosis Date  . ADHD   . Anxiety   . Depression   . Diabetes mellitus without complication ([place])   . Dyslipidemia associated with type 2 diabetes mellitus ([place]) 01/21/2017  . Erectile dysfunction 08/04/2016  . Insomnia 06/29/2016  . Low libido 08/04/2016  . Microalbuminuria   . Shoulder  dislocation 2008   Left shoulder  . Social anxiety disorder   . Tobacco use disorder    Past Surgical History:  Procedure Laterality Date  . NO PAST SURGERIES     Social History   Tobacco Use  . Smoking status: Former Smoker    Packs/day: 1.00    Years: 25.00    Pack years: 25.00    Types: Cigarettes    Quit date: 04/23/2017    Years since quitting: 1.7  . Smokeless tobacco: Never Used  Substance Use Topics  . Alcohol use: No    Comment: Drank 6-10 beers. Last drank during Christmas   family history includes Breast cancer in his paternal grandfather and paternal grandmother; Diabetes type II in his maternal grandmother and mother; Hypertension in his maternal grandmother, mother, and sister.    ROS: negative except as noted in the HPI  Medications: Current  Outpatient Medications  Medication Sig Dispense Refill  . AMBULATORY NON FORMULARY MEDICATION Single glucometer with lancets, test strips.  Check morning fasting blood sugar daily 1 each 0  . aspirin EC 81 MG tablet Take 1 tablet (81 mg total) by mouth daily. 90 tablet 3  . atorvastatin (LIPITOR) 10 MG tablet Take 1 tablet (10 mg total) by mouth daily. 90 tablet 1  . Blood Glucose Monitoring Suppl (ONE TOUCH ULTRA 2) w/Device KIT Inject as directed daily.  0  . clonazePAM (KLONOPIN) 1 MG tablet Take 1 tablet (1 mg total) by mouth 2 (two) times daily as needed for anxiety. 30 tablet 2  . Dulaglutide (TRULICITY) 1.5 BJ/6.2GB SOPN Inject 1.5 mg into the skin once a week. 6 mL 1  . Insulin Detemir (LEVEMIR) 100 UNIT/ML Pen Inject 10 Units into the skin every evening. Increase by 2U every 3 days until morning FBG less than 130 15 mL 0  . Insulin Pen Needle (NOVOFINE) 32G X 6 MM MISC Use subcutaneously daily with Levemir 90 each 1  . levocetirizine (XYZAL ALLERGY 24HR) 5 MG tablet Take 1 tablet (5 mg total) by mouth every evening. 90 tablet 3  . losartan (COZAAR) 100 MG tablet Take 1 tablet (100 mg total) by mouth daily. 90  tablet 1  . ONE TOUCH ULTRA TEST test strip TEST DAILY AS DIRECTED 100 each 0  . ONETOUCH DELICA LANCETS 15V MISC TEST DAILY AS DIRECTED  0  . sitaGLIPtin (JANUVIA) 100 MG tablet Take 1 tablet (100 mg total) by mouth daily. (Patient not taking: Reported on 01/09/2019) 30 tablet 2  . tadalafil (CIALIS) 5 MG tablet Take 1-2 tablets (5-10 mg total) by mouth daily as needed for erectile dysfunction. (Patient not taking: Reported on 01/09/2019) 10 tablet 11   No current facility-administered medications for this visit.    Allergies  Allergen Reactions  . Metformin And Related Nausea Only       Objective:  BP (!) 171/97   Pulse (!) 121   Resp 18   Wt 264 lb (119.7 kg)   SpO2 98%   BMI 35.80 kg/m  Gen:  alert, ill-appearing, not toxic-appearing, no acute distress, appropriate for age, obese male HEENT: head normocephalic without obvious abnormality, conjunctiva and cornea clear, trachea midline Pulm: Normal work of breathing, normal phonation, clear to auscultation bilaterally, no wheezes, rales or rhonchi CV: tachycardic rate, regular rhythm, s1 and s2 distinct, no murmurs, clicks or rubs  Neuro: alert and oriented x 3, no tremor MSK: extremities atraumatic, normal gait and station Skin: intact, diaphoretic, no rashes on exposed skin, no jaundice, no cyanosis   Lab Results  Component Value Date   HGBA1C 8.7 (A) 12/13/2018   Lab Results  Component Value Date   CREATININE 0.85 12/13/2018   BUN 17 12/13/2018   NA 136 12/13/2018   K 4.0 12/13/2018   CL 101 12/13/2018   CO2 23 12/13/2018   Lab Results  Component Value Date   ALT 67 (H) 12/13/2018   AST 36 12/13/2018   ALKPHOS 104 01/19/2017   BILITOT 0.6 12/13/2018    Assessment and Plan: 48 y.o. male with   .Diagnoses and all orders for this visit:  Suspected Covid-19 Virus Infection -     SARS-COV-2 RNA,(COVID-19) QUAL NAAT -     MyChart COVID-19 home monitoring program; Future  Respiratory illness -      SARS-COV-2 RNA,(COVID-19) QUAL NAAT  Uncontrolled type 2 diabetes mellitus with microalbuminuria, without long-term current use of  insulin (HCC) -     Insulin Detemir (LEVEMIR) 100 UNIT/ML Pen; Inject 10 Units into the skin every evening. Increase by 2U every 3 days until morning FBG less than 130 -     Insulin Pen Needle (NOVOFINE) 32G X 6 MM MISC; Use subcutaneously daily with Levemir  Tachycardia with heart rate 100-120 beats per minute  Elevated blood pressure reading in office with diagnosis of hypertension  White coat syndrome with diagnosis of hypertension  Elevated ALT measurement -     Hepatic function panel -     CBC   Patient tachycardic at 120 bpm in office today.  Hypertensive at 171/97. Denies headache/chest pain/dizzines/lightheadedness. Known white coat syndrome/social anxiety Due to presence of subjective fevers/nightsweats and SOB patient was tested for COVID-19 in office today Patient counseled to quarantine and self-monitor Work note provided  Remainder of his visit was completed virtually by phone to minimize potential exposure to respiratory virus  Uncontrolled Diabetes A1C>8.0 Home readings above 300 this week Intolerant to Metformin. Unable to afford Januvia. On max dose Trulicity Start Levemir 10 U nightly Patient was counseled on insulin self-injection and self-titration  Referral placed to diabetes educator at [employer] F/u in 3 months  Patient education and anticipatory guidance given Patient agrees with treatment plan Follow-up in 2-3 days for respiratory illness or sooner as needed if symptoms worsen or fail to improve  I spent 25 minutes with this patient, greater than 50% was face-to-face time counseling regarding the above diagnoses  Darlyne Russian PA-C

## 2019-01-06 ENCOUNTER — Encounter (INDEPENDENT_AMBULATORY_CARE_PROVIDER_SITE_OTHER): Payer: Self-pay

## 2019-01-07 LAB — SARS-COV-2 RNA,(COVID-19) QUALITATIVE NAAT: SARS CoV2 RNA: NOT DETECTED

## 2019-01-08 ENCOUNTER — Encounter (INDEPENDENT_AMBULATORY_CARE_PROVIDER_SITE_OTHER): Payer: Self-pay

## 2019-01-09 ENCOUNTER — Ambulatory Visit (INDEPENDENT_AMBULATORY_CARE_PROVIDER_SITE_OTHER): Payer: 59 | Admitting: Physician Assistant

## 2019-01-09 ENCOUNTER — Encounter: Payer: Self-pay | Admitting: Physician Assistant

## 2019-01-09 ENCOUNTER — Telehealth: Payer: Self-pay | Admitting: Physician Assistant

## 2019-01-09 DIAGNOSIS — R058 Other specified cough: Secondary | ICD-10-CM

## 2019-01-09 DIAGNOSIS — T464X5A Adverse effect of angiotensin-converting-enzyme inhibitors, initial encounter: Secondary | ICD-10-CM

## 2019-01-09 DIAGNOSIS — R61 Generalized hyperhidrosis: Secondary | ICD-10-CM | POA: Diagnosis not present

## 2019-01-09 DIAGNOSIS — F4010 Social phobia, unspecified: Secondary | ICD-10-CM | POA: Diagnosis not present

## 2019-01-09 DIAGNOSIS — Z9109 Other allergy status, other than to drugs and biological substances: Secondary | ICD-10-CM

## 2019-01-09 DIAGNOSIS — E1129 Type 2 diabetes mellitus with other diabetic kidney complication: Secondary | ICD-10-CM

## 2019-01-09 DIAGNOSIS — E1165 Type 2 diabetes mellitus with hyperglycemia: Secondary | ICD-10-CM

## 2019-01-09 DIAGNOSIS — E1159 Type 2 diabetes mellitus with other circulatory complications: Secondary | ICD-10-CM

## 2019-01-09 DIAGNOSIS — I10 Essential (primary) hypertension: Secondary | ICD-10-CM

## 2019-01-09 DIAGNOSIS — F4001 Agoraphobia with panic disorder: Secondary | ICD-10-CM | POA: Diagnosis not present

## 2019-01-09 DIAGNOSIS — R05 Cough: Secondary | ICD-10-CM

## 2019-01-09 DIAGNOSIS — IMO0002 Reserved for concepts with insufficient information to code with codable children: Secondary | ICD-10-CM

## 2019-01-09 DIAGNOSIS — R809 Proteinuria, unspecified: Secondary | ICD-10-CM

## 2019-01-09 DIAGNOSIS — I152 Hypertension secondary to endocrine disorders: Secondary | ICD-10-CM

## 2019-01-09 DIAGNOSIS — J302 Other seasonal allergic rhinitis: Secondary | ICD-10-CM

## 2019-01-09 MED ORDER — LOSARTAN POTASSIUM 100 MG PO TABS: 100.0000 mg | ORAL_TABLET | Freq: Every day | ORAL | 1 refills | Status: DC

## 2019-01-09 MED ORDER — CLONAZEPAM 1 MG PO TABS: 1.0000 mg | ORAL_TABLET | Freq: Two times a day (BID) | ORAL | 2 refills | Status: DC | PRN

## 2019-01-09 MED ORDER — LEVOCETIRIZINE DIHYDROCHLORIDE 5 MG PO TABS: 5.0000 mg | ORAL_TABLET | Freq: Every evening | ORAL | 3 refills | Status: DC

## 2019-01-09 NOTE — Telephone Encounter (Signed)
Can you complete Januvia PA? Failed Metformin Not controlled on Trulicity

## 2019-01-09 NOTE — Progress Notes (Signed)
Virtual Visit via Video Note  I connected with Albert [name] on 01/09/19 at  8:10 AM EDT by a video enabled telemedicine application and verified that I am speaking with the correct person using two identifiers.   I discussed the limitations of evaluation and management by telemedicine and the availability of in person appointments. The patient expressed understanding and agreed to proceed.  History of Present Illness: HPI:                                                                Albert [name] is a 48 y.o. male   CC: follow-up hyperglycemia/SOB  Patient was tested for COVID-19 on 01/05/19. Test result was negative.  He is still having some mild shortness of breath, which he states is improved compared to last week, and cough is unchanged. No additional nightsweats since Thursday night. Reports he is feeling sluggish in the morning, but this improves throughout the day. Denies fever or chills, sore throat, diarrhea.   In terms of diabetes, he did not start Levemir out of concern for having to take insulin the rest of his life. Januvia is still undergoing PA. Reports fasting blood sugar is decreasing 01/09/19 - 225 01/08/19 - 261 He was contacted by the diabetes educator, but has not met with her due to social anxiety.  Requesting refill of Clonazepam today. He has an upcoming move that he is worried about. Did not get any sleep last night due to anxiety about his test results.  Depression screen [facility] 2/9 01/05/2019 05/19/2018 03/16/2018 10/08/2017 02/18/2017  Decreased Interest 2 1 0 0 0  Down, Depressed, Hopeless 1 1 0 0 0  PHQ - 2 Score 3 2 0 0 0  Altered sleeping 3 2 0 - 1  Tired, decreased energy 3 1 0 - 0  Change in appetite 1 2 0 - 0  Feeling bad or failure about yourself  1 0 0 - 0  Trouble concentrating 1 2 0 - 1  Moving slowly or fidgety/restless 0 0 0 - 0  Suicidal thoughts 0 0 0 - 0  PHQ-9 Score 12 9 0 - 2  Difficult doing work/chores - Somewhat difficult Not difficult at all -  -    GAD 7 : Generalized Anxiety Score 01/05/2019 05/19/2018 03/16/2018 10/08/2017  Nervous, Anxious, on Edge 2 2 3 1   Control/stop worrying 0 1 3 1   Worry too much - different things 0 0 3 1  Trouble relaxing 2 2 3 1   Restless 1 1 1 1   Easily annoyed or irritable 2 0 2 0  Afraid - awful might happen 0 1 1 1   Total GAD 7 Score 7 7 16 6   Anxiety Difficulty - Very difficult Very difficult -      Past Medical History:  Diagnosis Date  . ADHD   . Anxiety   . Depression   . Diabetes mellitus without complication ([place])   . Dyslipidemia associated with type 2 diabetes mellitus ([place]) 01/21/2017  . Erectile dysfunction 08/04/2016  . Insomnia 06/29/2016  . Low libido 08/04/2016  . Microalbuminuria   . Shoulder dislocation 2008   Left shoulder  . Social anxiety disorder   . Tobacco use disorder    Past Surgical History:  Procedure Laterality Date  .  NO PAST SURGERIES     Social History   Tobacco Use  . Smoking status: Former Smoker    Packs/day: 1.00    Years: 25.00    Pack years: 25.00    Types: Cigarettes    Quit date: 04/23/2017    Years since quitting: 1.7  . Smokeless tobacco: Never Used  Substance Use Topics  . Alcohol use: No    Comment: Drank 6-10 beers. Last drank during Christmas   family history includes Breast cancer in his paternal grandfather and paternal grandmother; Diabetes type II in his maternal grandmother and mother; Hypertension in his maternal grandmother, mother, and sister.    ROS: negative except as noted in the HPI  Medications: Current Outpatient Medications  Medication Sig Dispense Refill  . AMBULATORY NON FORMULARY MEDICATION Single glucometer with lancets, test strips.  Check morning fasting blood sugar daily 1 each 0  . aspirin EC 81 MG tablet Take 1 tablet (81 mg total) by mouth daily. 90 tablet 3  . atorvastatin (LIPITOR) 10 MG tablet Take 1 tablet (10 mg total) by mouth daily. 90 tablet 1  . Blood Glucose Monitoring Suppl (ONE TOUCH  ULTRA 2) w/Device KIT Inject as directed daily.  0  . clonazePAM (KLONOPIN) 1 MG tablet Take 1 tablet (1 mg total) by mouth 2 (two) times daily as needed for anxiety. 30 tablet 2  . Dulaglutide (TRULICITY) 1.5 VZ/8.5YI SOPN Inject 1.5 mg into the skin once a week. 6 mL 1  . Insulin Detemir (LEVEMIR) 100 UNIT/ML Pen Inject 10 Units into the skin every evening. Increase by 2U every 3 days until morning FBG less than 130 15 mL 0  . Insulin Pen Needle (NOVOFINE) 32G X 6 MM MISC Use subcutaneously daily with Levemir 90 each 1  . ONE TOUCH ULTRA TEST test strip TEST DAILY AS DIRECTED 100 each 0  . ONETOUCH DELICA LANCETS 50Y MISC TEST DAILY AS DIRECTED  0  . levocetirizine (XYZAL ALLERGY 24HR) 5 MG tablet Take 1 tablet (5 mg total) by mouth every evening. 90 tablet 3  . losartan (COZAAR) 100 MG tablet Take 1 tablet (100 mg total) by mouth daily. 90 tablet 1  . sitaGLIPtin (JANUVIA) 100 MG tablet Take 1 tablet (100 mg total) by mouth daily. (Patient not taking: Reported on 01/09/2019) 30 tablet 2  . tadalafil (CIALIS) 5 MG tablet Take 1-2 tablets (5-10 mg total) by mouth daily as needed for erectile dysfunction. (Patient not taking: Reported on 01/09/2019) 10 tablet 11   No current facility-administered medications for this visit.    Allergies  Allergen Reactions  . Metformin And Related Nausea Only       Objective:  There were no vitals taken for this visit. Gen:  alert, not ill-appearing, no distress, appropriate for age 53: head normocephalic without obvious abnormality, conjunctiva and cornea clear, trachea midline Pulm: Normal work of breathing, normal phonation, occasional throat clearing Neuro: alert and oriented x 3 Psych: cooperative, anxious mood, affect mood-congruent, speech is articulate, normal rate and volume; thought processes clear and goal-directed, normal judgment, good insight  BP Readings from Last 3 Encounters:  01/05/19 (!) 171/97  12/13/18 131/86  06/15/18 (!)  153/96   Pulse Readings from Last 3 Encounters:  01/05/19 (!) 121  12/13/18 94  06/15/18 (!) 106     No results found for this or any previous visit (from the past 72 hour(s)). No results found.    Assessment and Plan: 48 y.o. male with   .Barbaraann Rondo  was seen today for follow-up.  Diagnoses and all orders for this visit:  Uncontrolled type 2 diabetes mellitus with hyperglycemia (HCC)  Panic disorder with agoraphobia -     clonazePAM (KLONOPIN) 1 MG tablet; Take 1 tablet (1 mg total) by mouth 2 (two) times daily as needed for anxiety.  Social anxiety disorder -     clonazePAM (KLONOPIN) 1 MG tablet; Take 1 tablet (1 mg total) by mouth 2 (two) times daily as needed for anxiety.  Night sweat  Seasonal allergic rhinitis, unspecified trigger  Hypertension associated with diabetes ([place]) -     losartan (COZAAR) 100 MG tablet; Take 1 tablet (100 mg total) by mouth daily.  Uncontrolled type 2 diabetes mellitus with microalbuminuria, without long-term current use of insulin (HCC) -     losartan (COZAAR) 100 MG tablet; Take 1 tablet (100 mg total) by mouth daily.  Cough due to ACE inhibitor  Environmental allergies -     levocetirizine (XYZAL ALLERGY 24HR) 5 MG tablet; Take 1 tablet (5 mg total) by mouth every evening.   Cough, Nightsweats SARS COV-2 negative Nightsweats likely related to hyperglycemia Cough possibly due to ACE and/or environmental allergies D/C Lisinopril Switch to Losartan Start antihistamine Return to work note provided  Uncontrolled Type 2 DM with Hyperglycemia Encouraged to start Levemir 10U. Do not self-titrate Start Januvia once PA goes through Plan to continue Januvia and Levemir together for 1 month until FBG<100, then we will taper off of Levemir Cont Trulicity  Social anxiety, Panic disorder Checked PDMP, no red flags Clonazepam refill sent to pharmacy #[id number]  Follow-up in office in 2 months for HTN/DM    Follow Up Instructions:    I  discussed the assessment and treatment plan with the patient. The patient was provided an opportunity to ask questions and all were answered. The patient agreed with the plan and demonstrated an understanding of the instructions.   The patient was advised to call back or seek an in-person evaluation if the symptoms worsen or if the condition fails to improve as anticipated.  I provided 10 minutes of non-face-to-face time during this encounter.   Trixie Dredge, [place]

## 2019-01-09 NOTE — Telephone Encounter (Signed)
Information has been sent to insurance and waiting on a response.   

## 2019-01-09 NOTE — Telephone Encounter (Signed)
Received a fax from [facility] that Januvia 100 mg tablets are covered from 01/09/2019 through 01/09/2019. Pharmacy aware and forms sent to scan.

## 2019-01-13 ENCOUNTER — Encounter: Payer: Self-pay | Admitting: Physician Assistant

## 2019-01-15 ENCOUNTER — Encounter: Payer: Self-pay | Admitting: Physician Assistant

## 2019-01-15 DIAGNOSIS — I10 Essential (primary) hypertension: Secondary | ICD-10-CM | POA: Insufficient documentation

## 2019-01-25 ENCOUNTER — Encounter: Payer: Self-pay | Admitting: Physician Assistant

## 2019-01-26 NOTE — Telephone Encounter (Signed)
Left a message for a return call.

## 2019-01-28 ENCOUNTER — Other Ambulatory Visit: Payer: Self-pay | Admitting: Physician Assistant

## 2019-01-28 DIAGNOSIS — IMO0002 Reserved for concepts with insufficient information to code with codable children: Secondary | ICD-10-CM

## 2019-01-28 DIAGNOSIS — E1129 Type 2 diabetes mellitus with other diabetic kidney complication: Secondary | ICD-10-CM

## 2019-02-20 ENCOUNTER — Other Ambulatory Visit: Payer: Self-pay | Admitting: Physician Assistant

## 2019-02-20 DIAGNOSIS — E1129 Type 2 diabetes mellitus with other diabetic kidney complication: Secondary | ICD-10-CM

## 2019-02-20 DIAGNOSIS — IMO0002 Reserved for concepts with insufficient information to code with codable children: Secondary | ICD-10-CM

## 2019-03-08 ENCOUNTER — Other Ambulatory Visit: Payer: Self-pay | Admitting: Physician Assistant

## 2019-03-08 DIAGNOSIS — I152 Hypertension secondary to endocrine disorders: Secondary | ICD-10-CM

## 2019-03-08 DIAGNOSIS — E1159 Type 2 diabetes mellitus with other circulatory complications: Secondary | ICD-10-CM

## 2019-03-08 DIAGNOSIS — E1129 Type 2 diabetes mellitus with other diabetic kidney complication: Secondary | ICD-10-CM

## 2019-03-08 DIAGNOSIS — IMO0002 Reserved for concepts with insufficient information to code with codable children: Secondary | ICD-10-CM

## 2019-04-12 ENCOUNTER — Other Ambulatory Visit: Payer: Self-pay | Admitting: Physician Assistant

## 2019-04-12 DIAGNOSIS — F4010 Social phobia, unspecified: Secondary | ICD-10-CM

## 2019-04-12 DIAGNOSIS — F4001 Agoraphobia with panic disorder: Secondary | ICD-10-CM

## 2019-04-13 NOTE — Telephone Encounter (Signed)
This is a [facility] patient. Please advise. He was last seen in June. Please advise.

## 2019-04-19 ENCOUNTER — Other Ambulatory Visit: Payer: Self-pay | Admitting: Physician Assistant

## 2019-04-19 DIAGNOSIS — IMO0002 Reserved for concepts with insufficient information to code with codable children: Secondary | ICD-10-CM

## 2019-04-19 DIAGNOSIS — E1129 Type 2 diabetes mellitus with other diabetic kidney complication: Secondary | ICD-10-CM

## 2019-04-30 ENCOUNTER — Encounter: Payer: Self-pay | Admitting: Physician Assistant

## 2019-06-03 ENCOUNTER — Other Ambulatory Visit: Payer: Self-pay | Admitting: Physician Assistant

## 2019-06-03 DIAGNOSIS — E1129 Type 2 diabetes mellitus with other diabetic kidney complication: Secondary | ICD-10-CM

## 2019-06-03 DIAGNOSIS — IMO0002 Reserved for concepts with insufficient information to code with codable children: Secondary | ICD-10-CM

## 2019-06-17 ENCOUNTER — Other Ambulatory Visit: Payer: Self-pay | Admitting: Physician Assistant

## 2019-06-17 DIAGNOSIS — E1129 Type 2 diabetes mellitus with other diabetic kidney complication: Secondary | ICD-10-CM

## 2019-06-17 DIAGNOSIS — IMO0002 Reserved for concepts with insufficient information to code with codable children: Secondary | ICD-10-CM

## 2019-06-27 ENCOUNTER — Other Ambulatory Visit: Payer: Self-pay | Admitting: Physician Assistant

## 2019-06-27 DIAGNOSIS — E1129 Type 2 diabetes mellitus with other diabetic kidney complication: Secondary | ICD-10-CM

## 2019-06-27 DIAGNOSIS — IMO0002 Reserved for concepts with insufficient information to code with codable children: Secondary | ICD-10-CM

## 2019-07-03 ENCOUNTER — Other Ambulatory Visit: Payer: Self-pay | Admitting: Physician Assistant

## 2019-07-03 ENCOUNTER — Telehealth: Payer: Self-pay | Admitting: Osteopathic Medicine

## 2019-07-03 DIAGNOSIS — E1129 Type 2 diabetes mellitus with other diabetic kidney complication: Secondary | ICD-10-CM

## 2019-07-03 DIAGNOSIS — IMO0002 Reserved for concepts with insufficient information to code with codable children: Secondary | ICD-10-CM

## 2019-07-03 DIAGNOSIS — E1159 Type 2 diabetes mellitus with other circulatory complications: Secondary | ICD-10-CM

## 2019-07-03 DIAGNOSIS — I152 Hypertension secondary to endocrine disorders: Secondary | ICD-10-CM

## 2019-07-03 NOTE — Telephone Encounter (Signed)
-----   Message from [facility] sent at 17/00/1749  1:05 PM EST ----- Called and LVM to schedule appt with Dr. [name] Coil or get put on the list for Dr. Zigmund Daniel  ----- Message ----- From: Tasia Catchings, CMA Sent: 07/03/2019  11:49 AM EST To: Levell July, [place]  Patient needs to establish with a provider since he was a [facility] patient, so he can get further refills for his BP medication.

## 2019-07-15 ENCOUNTER — Other Ambulatory Visit: Payer: Self-pay | Admitting: Osteopathic Medicine

## 2019-07-15 DIAGNOSIS — E1129 Type 2 diabetes mellitus with other diabetic kidney complication: Secondary | ICD-10-CM

## 2019-07-15 DIAGNOSIS — I152 Hypertension secondary to endocrine disorders: Secondary | ICD-10-CM

## 2019-07-15 DIAGNOSIS — E1159 Type 2 diabetes mellitus with other circulatory complications: Secondary | ICD-10-CM

## 2019-07-15 DIAGNOSIS — IMO0002 Reserved for concepts with insufficient information to code with codable children: Secondary | ICD-10-CM

## 2019-07-16 NOTE — Telephone Encounter (Signed)
Requested medication (s) are due for refill today: no  Requested medication (s) are on the active medication list: yes  Last refill:  07/03/2019  Future visit scheduled: no  Notes to clinic:  requesting a 90 day supply    Requested Prescriptions  Pending Prescriptions Disp Refills   lisinopril (ZESTRIL) 20 MG tablet [Pharmacy Med Name: LISINOPRIL 20 MG TABLET] 90 tablet 1    Sig: TAKE 1 TABLET BY MOUTH EVERY DAY      Cardiovascular:  ACE Inhibitors Failed - 07/15/2019  4:24 PM      Failed - Cr in normal range and within 180 days    Creat  Date Value Ref Range Status  12/13/2018 0.85 0.60 - 1.35 mg/dL Final   Creatinine, POC  Date Value Ref Range Status  01/12/2017 100 mg/dL Final          Failed - K in normal range and within 180 days    Potassium  Date Value Ref Range Status  12/13/2018 4.0 3.5 - 5.3 mmol/L Final          Failed - Last BP in normal range    BP Readings from Last 1 Encounters:  01/05/19 (!) 171/97          Failed - Valid encounter within last 6 months    Recent Outpatient Visits           6 months ago Uncontrolled type 2 diabetes mellitus with hyperglycemia [facility])   [place] [facility], Bernerd Pho, PA-C   6 months ago Suspected Covid-19 Virus Infection   [place] [facility] [place], PA-C   7 months ago Laceration of left index finger without foreign body without damage to nail, subsequent encounter   [facility], Bernerd Pho, PA-C   1 year ago Uncontrolled type 2 diabetes mellitus with microalbuminuria, without long-term current use of insulin [facility])   [place] [facility], Bernerd Pho, PA-C   1 year ago Panic disorder with agoraphobia   [facility], [place], [place]              Passed - Patient is not  pregnant

## 2019-07-18 NOTE — Telephone Encounter (Signed)
Must make appointment 

## 2019-07-21 ENCOUNTER — Other Ambulatory Visit: Payer: Self-pay | Admitting: Physician Assistant

## 2019-07-21 DIAGNOSIS — E1129 Type 2 diabetes mellitus with other diabetic kidney complication: Secondary | ICD-10-CM

## 2019-07-21 DIAGNOSIS — IMO0002 Reserved for concepts with insufficient information to code with codable children: Secondary | ICD-10-CM

## 2019-07-22 ENCOUNTER — Other Ambulatory Visit: Payer: Self-pay | Admitting: Osteopathic Medicine

## 2019-07-22 DIAGNOSIS — IMO0002 Reserved for concepts with insufficient information to code with codable children: Secondary | ICD-10-CM

## 2019-07-22 DIAGNOSIS — E1129 Type 2 diabetes mellitus with other diabetic kidney complication: Secondary | ICD-10-CM

## 2019-07-22 NOTE — Telephone Encounter (Signed)
Requested medication (s) are due for refill today: yes  Requested medication (s) are on the active medication list: yes  Last refill:  06/27/2019  Future visit scheduled:no  Notes to clinic:  review for refill   Requested Prescriptions  Pending Prescriptions Disp Refills   TRULICITY 1.5 MG/0.5ML SOPN [Pharmacy Med Name: TRULICITY 1.5 MG/0.5 ML PEN]  0    Sig: INJECT 1.5 MG INTO THE SKIN ONCE A WEEK. MUST MAKE AN APPOINTMENT WITH NEW PCP      Endocrinology:  Diabetes - GLP-1 Receptor Agonists Failed - 07/22/2019  9:21 AM      Failed - HBA1C is between 0 and 7.9 and within 180 days    HbA1c, POC (controlled diabetic range)  Date Value Ref Range Status  12/13/2018 8.7 (A) 0.0 - 7.0 % Final          Failed - Valid encounter within last 6 months    Recent Outpatient Visits           6 months ago Uncontrolled type 2 diabetes mellitus with hyperglycemia [facility])   [place] [facility], Bernerd Pho, PA-C   6 months ago Suspected Covid-19 Virus Infection   [place] [facility], [place], PA-C   7 months ago Laceration of left index finger without foreign body without damage to nail, subsequent encounter   [facility], Bernerd Pho, PA-C   1 year ago Uncontrolled type 2 diabetes mellitus with microalbuminuria, without long-term current use of insulin [facility])   [place] [facility], Bernerd Pho, PA-C   1 year ago Panic disorder with agoraphobia   [facility], [place], [place]

## 2019-07-23 ENCOUNTER — Other Ambulatory Visit: Payer: Self-pay | Admitting: Physician Assistant

## 2019-07-23 DIAGNOSIS — F4010 Social phobia, unspecified: Secondary | ICD-10-CM

## 2019-07-23 DIAGNOSIS — F4001 Agoraphobia with panic disorder: Secondary | ICD-10-CM

## 2019-08-18 ENCOUNTER — Ambulatory Visit: Payer: 59 | Admitting: Family Medicine

## 2019-08-29 ENCOUNTER — Other Ambulatory Visit: Payer: Self-pay

## 2019-08-29 ENCOUNTER — Encounter: Payer: Self-pay | Admitting: Family Medicine

## 2019-08-29 ENCOUNTER — Ambulatory Visit: Payer: No Typology Code available for payment source | Admitting: Family Medicine

## 2019-08-29 VITALS — BP 146/99 | HR 99 | Temp 98.0°F | Ht 68.0 in | Wt 274.0 lb

## 2019-08-29 DIAGNOSIS — E1169 Type 2 diabetes mellitus with other specified complication: Secondary | ICD-10-CM

## 2019-08-29 DIAGNOSIS — F4010 Social phobia, unspecified: Secondary | ICD-10-CM

## 2019-08-29 DIAGNOSIS — E1165 Type 2 diabetes mellitus with hyperglycemia: Secondary | ICD-10-CM | POA: Diagnosis not present

## 2019-08-29 DIAGNOSIS — I152 Hypertension secondary to endocrine disorders: Secondary | ICD-10-CM

## 2019-08-29 DIAGNOSIS — R6882 Decreased libido: Secondary | ICD-10-CM

## 2019-08-29 DIAGNOSIS — E785 Hyperlipidemia, unspecified: Secondary | ICD-10-CM

## 2019-08-29 DIAGNOSIS — I10 Essential (primary) hypertension: Secondary | ICD-10-CM

## 2019-08-29 DIAGNOSIS — F4001 Agoraphobia with panic disorder: Secondary | ICD-10-CM | POA: Diagnosis not present

## 2019-08-29 DIAGNOSIS — E1129 Type 2 diabetes mellitus with other diabetic kidney complication: Secondary | ICD-10-CM | POA: Diagnosis not present

## 2019-08-29 DIAGNOSIS — E1159 Type 2 diabetes mellitus with other circulatory complications: Secondary | ICD-10-CM

## 2019-08-29 DIAGNOSIS — R809 Proteinuria, unspecified: Secondary | ICD-10-CM

## 2019-08-29 DIAGNOSIS — IMO0002 Reserved for concepts with insufficient information to code with codable children: Secondary | ICD-10-CM

## 2019-08-29 LAB — POCT GLYCOSYLATED HEMOGLOBIN (HGB A1C): Hemoglobin A1C: 9.6 % — AB (ref 4.0–5.6)

## 2019-08-29 MED ORDER — BD PEN NEEDLE NANO U/F 32G X 4 MM MISC: 1 refills | Status: DC

## 2019-08-29 MED ORDER — LEVEMIR FLEXTOUCH 100 UNIT/ML ~~LOC~~ SOPN: 10.0000 [IU] | PEN_INJECTOR | Freq: Every evening | SUBCUTANEOUS | 3 refills | Status: DC

## 2019-08-29 MED ORDER — CLONAZEPAM 1 MG PO TABS: 1.0000 mg | ORAL_TABLET | Freq: Two times a day (BID) | ORAL | 5 refills | Status: DC | PRN

## 2019-08-29 MED ORDER — TRULICITY 1.5 MG/0.5ML ~~LOC~~ SOAJ: SUBCUTANEOUS | 1 refills | Status: DC

## 2019-08-29 MED ORDER — LOSARTAN POTASSIUM 100 MG PO TABS: 100.0000 mg | ORAL_TABLET | Freq: Every day | ORAL | 1 refills | Status: DC

## 2019-08-29 MED ORDER — ATORVASTATIN CALCIUM 10 MG PO TABS: 10.0000 mg | ORAL_TABLET | Freq: Every day | ORAL | 2 refills | Status: DC

## 2019-08-29 NOTE — Assessment & Plan Note (Signed)
Some response to tadalafil.  Will also check testosterone levels.

## 2019-08-29 NOTE — Assessment & Plan Note (Signed)
BP elevated in office, however BP exacerbated by visits to clinic. BP readings at home have been well controlled. Continue losartan at current dose. Recommend low sodium diet and regular exercise as well.

## 2019-08-29 NOTE — Progress Notes (Signed)
Albert [name] - 49 y.o. male MRN [id number]  Date of birth: [birth year]  Subjective Chief Complaint  Patient presents with  . Diabetes  . Hypertension    HPI Albert [name] is a 49 y.o. male with history of HTN, T2DM, HLD and anxiety here today for follow up.    -HTN:  Current treatment with losartan.  He denies side effects related to medication.  He has not had chest pain, shortness of breath, palpitations, headache or vision changes.   -T2DM:  Reports average blood sugars around 175-200.  He has been out of trulicity for a few weeks and has not been using insulin regularly because he is out of needles. He admits that diet could be better. He does stay fairly active at work.    -HLD:  Reports compliance to atorvastatin.  He denies side effects related to medication including myalgias. He is due for updated lipid panel.  -Anxiety:  Has tried several SSRI's in the past with significant sexual side effects.  He is currently treated with clonazepam as needed.  Main triggers of his anxiety are social interactions.    -ED: treated with tadalafil but has not had great results with this.  He does have decreased libido as well.  He would like to have testosterone levels checked.   ROS:  A comprehensive ROS was completed and negative except as noted per HPI  Allergies  Allergen Reactions  . Metformin And Related Nausea Only    Past Medical History:  Diagnosis Date  . ADHD   . Anxiety   . Depression   . Diabetes mellitus without complication (HCC)   . Dyslipidemia associated with type 2 diabetes mellitus (HCC) 01/21/2017  . Erectile dysfunction 08/04/2016  . Insomnia 06/29/2016  . Low libido 08/04/2016  . Microalbuminuria   . Shoulder dislocation 2008   Left shoulder  . Social anxiety disorder   . Tobacco use disorder     Past Surgical History:  Procedure Laterality Date  . NO PAST SURGERIES      Social History   Socioeconomic History  . Marital status: Significant Other   Spouse name: Not on file  . Number of children: Not on file  . Years of education: Not on file  . Highest education level: Not on file  Occupational History  . Not on file  Tobacco Use  . Smoking status: Former Smoker    Packs/day: 1.00    Years: 25.00    Pack years: 25.00    Types: Cigarettes    Quit date: 04/23/2017    Years since quitting: 2.3  . Smokeless tobacco: Never Used  Substance and Sexual Activity  . Alcohol use: No    Comment: Drank 6-10 beers. Last drank during Christmas  . Drug use: No  . Sexual activity: Yes    Partners: Female  Other Topics Concern  . Not on file  Social History Narrative  . Not on file   Social Determinants of Health   Financial Resource Strain:   . Difficulty of Paying Living Expenses: Not on file  Food Insecurity:   . Worried About Programme researcher, broadcasting/film/video in the Last Year: Not on file  . Ran Out of Food in the Last Year: Not on file  Transportation Needs:   . Lack of Transportation (Medical): Not on file  . Lack of Transportation (Non-Medical): Not on file  Physical Activity:   . Days of Exercise per Week: Not on file  . Minutes of Exercise per Session:  Not on file  Stress:   . Feeling of Stress : Not on file  Social Connections:   . Frequency of Communication with Friends and Family: Not on file  . Frequency of Social Gatherings with Friends and Family: Not on file  . Attends Religious Services: Not on file  . Active Member of Clubs or Organizations: Not on file  . Attends Archivist Meetings: Not on file  . Marital Status: Not on file    Family History  Problem Relation Age of Onset  . Hypertension Mother   . Diabetes type II Mother   . Hypertension Sister   . Hypertension Maternal Grandmother   . Diabetes type II Maternal Grandmother   . Breast cancer Paternal Grandfather   . Breast cancer Paternal Grandmother     Health Maintenance  Topic Date Due  . HIV Screening  08/28/2020 (Originally [birth year]1987)  .  OPHTHALMOLOGY EXAM  11/11/2019  . HEMOGLOBIN A1C  02/26/2020  . FOOT EXAM  08/28/2020  . TETANUS/TDAP  12/05/2028  . PNEUMOCOCCAL POLYSACCHARIDE VACCINE AGE 83-64 HIGH RISK  Completed  . INFLUENZA VACCINE  Discontinued    ----------------------------------------------------------------------------------------------------------------------------------------------------------------------------------------------------------------- Physical Exam BP (!) 146/99   Pulse 99   Temp 98 F (36.7 C) (Oral)   Ht 5\' 8"  (1.727 m)   Wt 274 lb (124.3 kg)   BMI 41.66 kg/m   Physical Exam Constitutional:      Appearance: Normal appearance.  Eyes:     General: No scleral icterus. Cardiovascular:     Rate and Rhythm: Normal rate and regular rhythm.  Pulmonary:     Effort: Pulmonary effort is normal.     Breath sounds: Normal breath sounds.  Musculoskeletal:     Cervical back: Neck supple.  Skin:    General: Skin is warm and dry.  Neurological:     General: No focal deficit present.     Mental Status: He is alert.  Psychiatric:        Mood and Affect: Mood normal.        Behavior: Behavior normal.    Diabetic Foot Exam - Simple   Simple Foot Form Diabetic Foot exam was performed with the following findings: Yes 08/29/2019 10:44 PM  Visual Inspection No deformities, no ulcerations, no other skin breakdown bilaterally: Yes Sensation Testing Intact to touch and monofilament testing bilaterally: Yes Pulse Check Posterior Tibialis and Dorsalis pulse intact bilaterally: Yes Comments      ------------------------------------------------------------------------------------------------------------------------------------------------------------------------------------------------------------------- Assessment and Plan  Hypertension associated with diabetes ([place]) BP elevated in office, however BP exacerbated by visits to clinic. BP readings at home have been well controlled. Continue  losartan at current dose. Recommend low sodium diet and regular exercise as well.   Dyslipidemia associated with type 2 diabetes mellitus ([place]) Tolerating atorvastatin well, continue.  Update lipid panel.   Uncontrolled type 2 diabetes mellitus with microalbuminuria, without long-term current use of insulin ([place]) Most recent A1c of  Lab Results  Component Value Date   HGBA1C 9.6 (A) 08/29/2019   indicates diabetes is not well controlled.  he  will restart trulicity and use insulin regularly as directed.  Counseled on healthy, low carb diet and recommend frequent activity to help with maintaining good control of blood sugars.    Panic disorder with agoraphobia Continue clonazepam as needed.   Low libido Some response to tadalafil.  Will also check testosterone levels.    Meds ordered this encounter  Medications  . atorvastatin (LIPITOR) 10 MG tablet    Sig:  Take 1 tablet (10 mg total) by mouth daily.    Dispense:  90 tablet    Refill:  2  . clonazePAM (KLONOPIN) 1 MG tablet    Sig: Take 1 tablet (1 mg total) by mouth 2 (two) times daily as needed for anxiety.    Dispense:  30 tablet    Refill:  5    Not to exceed 3 additional fills before 07/08/2019  . Insulin Detemir (LEVEMIR FLEXTOUCH) 100 UNIT/ML Pen    Sig: Inject 10 Units into the skin every evening. Increase by 2U every 3 days until FBG Less than 130.    Dispense:  15 mL    Refill:  3  . losartan (COZAAR) 100 MG tablet    Sig: Take 1 tablet (100 mg total) by mouth daily.    Dispense:  90 tablet    Refill:  1  . Dulaglutide (TRULICITY) 1.5 MG/0.5ML SOPN    Sig: INJECT 1.5 MG INTO THE SKIN ONCE A WEEK. MUST MAKE AN APPOINTMENT WITH NEW PCP    Dispense:  12 pen    Refill:  1  . Insulin Pen Needle (BD PEN NEEDLE NANO U/F) 32G X 4 MM MISC    Sig: USE SUBCUTANEOUSLY DAILY WITH LEVEMIR    Dispense:  100 each    Refill:  1   Return in about 3 months (around 11/26/2019) for T2DM/HTN.    This visit occurred during the  SARS-CoV-2 public health emergency.  Safety protocols were in place, including screening questions prior to the visit, additional usage of staff PPE, and extensive cleaning of exam room while observing appropriate contact time as indicated for disinfecting solutions.

## 2019-08-29 NOTE — Patient Instructions (Signed)
Great to meet you today! Lab Results  Component Value Date   HGBA1C 9.6 (A) 08/29/2019  Restart trulicity and continue current medications.   Work on dietary changes to improve blood sugars as well.  Stop by and have labs completed next week.  I will see you back in 3 months.

## 2019-08-29 NOTE — Assessment & Plan Note (Signed)
Most recent A1c of  Lab Results  Component Value Date   HGBA1C 9.6 (A) 08/29/2019   indicates diabetes is not well controlled.  he  will restart trulicity and use insulin regularly as directed.  Counseled on healthy, low carb diet and recommend frequent activity to help with maintaining good control of blood sugars.

## 2019-08-29 NOTE — Assessment & Plan Note (Signed)
Continue clonazepam as needed. ?

## 2019-08-29 NOTE — Assessment & Plan Note (Signed)
Tolerating atorvastatin well, continue.  Update lipid panel.

## 2019-09-02 ENCOUNTER — Encounter: Payer: Self-pay | Admitting: Family Medicine

## 2019-11-28 ENCOUNTER — Ambulatory Visit: Payer: No Typology Code available for payment source | Admitting: Family Medicine

## 2019-12-05 ENCOUNTER — Encounter: Payer: Self-pay | Admitting: Family Medicine

## 2019-12-05 ENCOUNTER — Telehealth (INDEPENDENT_AMBULATORY_CARE_PROVIDER_SITE_OTHER): Payer: 59 | Admitting: Family Medicine

## 2019-12-05 DIAGNOSIS — R5083 Postvaccination fever: Secondary | ICD-10-CM

## 2019-12-05 DIAGNOSIS — R509 Fever, unspecified: Secondary | ICD-10-CM | POA: Insufficient documentation

## 2019-12-05 MED ORDER — BENZONATATE 100 MG PO CAPS: 100.0000 mg | ORAL_CAPSULE | Freq: Three times a day (TID) | ORAL | 0 refills | Status: DC | PRN

## 2019-12-05 NOTE — Telephone Encounter (Signed)
Patient has been scheduled

## 2019-12-05 NOTE — Assessment & Plan Note (Signed)
Symptoms likely related to 2nd dose of COVID vaccine.  Improving at this point.  Recommend continued supportive care at home.  I have sent over tessalon as needed for cough.  He will schedule f/u for diabetes once feeling better.

## 2019-12-05 NOTE — Progress Notes (Signed)
Albert [name] - 49 y.o. male MRN [id number]  Date of birth: 1970-10-07   This visit type was conducted due to national recommendations for restrictions regarding the COVID-19 Pandemic (e.g. social distancing).  This format is felt to be most appropriate for this patient at this time.  All issues noted in this document were discussed and addressed.  No physical exam was performed (except for noted visual exam findings with Video Visits).  I discussed the limitations of evaluation and management by telemedicine and the availability of in person appointments. The patient expressed understanding and agreed to proceed.  I connected with@ on 12/05/19 at  8:50 AM EDT by a video enabled telemedicine application and verified that I am speaking with the correct person using two identifiers.  Present at visit: Luetta Nutting, DO Renella Cunas   Patient Location: Home [address] [place] [address]   Provider location:   [facility]  Chief Complaint  Patient presents with  . URI    HPI  Agron Swiney is a 49 y.o. male who presents via audio/video conferencing for a telehealth visit today.  He has complaint of feve, sore throat, runny nose/post nasal drainage and cough.  This started about 3 days ago.  He had received 2nd COVID vaccine 24 hours prior to onset of symptoms.  He reports that symptoms are improving.  He denies shortness of breath, body aches, nausea, vomiting, diarrhea.    ROS:  A comprehensive ROS was completed and negative except as noted per HPI  Past Medical History:  Diagnosis Date  . ADHD   . Anxiety   . Depression   . Diabetes mellitus without complication ([place])   . Dyslipidemia associated with type 2 diabetes mellitus ([place]) 01/21/2017  . Erectile dysfunction 08/04/2016  . Insomnia 06/29/2016  . Low libido 08/04/2016  . Microalbuminuria   . Shoulder dislocation 2008   Left shoulder  . Social anxiety disorder   . Tobacco use disorder     Past Surgical History:   Procedure Laterality Date  . NO PAST SURGERIES      Family History  Problem Relation Age of Onset  . Hypertension Mother   . Diabetes type II Mother   . Hypertension Sister   . Hypertension Maternal Grandmother   . Diabetes type II Maternal Grandmother   . Breast cancer Paternal Grandfather   . Breast cancer Paternal Grandmother     Social History   Socioeconomic History  . Marital status: Significant Other    Spouse name: Not on file  . Number of children: Not on file  . Years of education: Not on file  . Highest education level: Not on file  Occupational History  . Not on file  Tobacco Use  . Smoking status: Former Smoker    Packs/day: 1.00    Years: 25.00    Pack years: 25.00    Types: Cigarettes    Quit date: 04/23/2017    Years since quitting: 2.6  . Smokeless tobacco: Never Used  Substance and Sexual Activity  . Alcohol use: No    Comment: Drank 6-10 beers. Last drank during Christmas  . Drug use: No  . Sexual activity: Yes    Partners: Female  Other Topics Concern  . Not on file  Social History Narrative  . Not on file   Social Determinants of Health   Financial Resource Strain:   . Difficulty of Paying Living Expenses:   Food Insecurity:   . Worried About [employer] of  Food in the Last Year:   . [place] in the Last Year:   Transportation Needs:   . Film/video editor (Medical):   Marland Kitchen Lack of Transportation (Non-Medical):   Physical Activity:   . Days of Exercise per Week:   . Minutes of Exercise per Session:   Stress:   . Feeling of Stress :   Social Connections:   . Frequency of Communication with Friends and Family:   . Frequency of Social Gatherings with Friends and Family:   . Attends Religious Services:   . Active Member of Clubs or Organizations:   . Attends Archivist Meetings:   Marland Kitchen Marital Status:   Intimate Partner Violence:   . Fear of Current or Ex-Partner:   . Emotionally Abused:   Marland Kitchen Physically Abused:    . Sexually Abused:      Current Outpatient Medications:  .  AMBULATORY NON FORMULARY MEDICATION, Single glucometer with lancets, test strips.  Check morning fasting blood sugar daily, Disp: 1 each, Rfl: 0 .  aspirin EC 81 MG tablet, Take 1 tablet (81 mg total) by mouth daily., Disp: 90 tablet, Rfl: 3 .  Blood Glucose Monitoring Suppl (ONE TOUCH ULTRA 2) w/Device KIT, Inject as directed daily., Disp: , Rfl: 0 .  clonazePAM (KLONOPIN) 1 MG tablet, Take 1 tablet (1 mg total) by mouth 2 (two) times daily as needed for anxiety., Disp: 30 tablet, Rfl: 5 .  Dulaglutide (TRULICITY) 1.5 XL/2.4MW SOPN, INJECT 1.5 MG INTO THE SKIN ONCE A WEEK. MUST MAKE AN APPOINTMENT WITH NEW PCP, Disp: 12 pen, Rfl: 1 .  Insulin Detemir (LEVEMIR FLEXTOUCH) 100 UNIT/ML Pen, Inject 10 Units into the skin every evening. Increase by 2U every 3 days until FBG Less than 130., Disp: 15 mL, Rfl: 3 .  Insulin Pen Needle (BD PEN NEEDLE NANO U/F) 32G X 4 MM MISC, USE SUBCUTANEOUSLY DAILY WITH LEVEMIR, Disp: 100 each, Rfl: 1 .  losartan (COZAAR) 100 MG tablet, Take 1 tablet (100 mg total) by mouth daily., Disp: 90 tablet, Rfl: 1 .  ONE TOUCH ULTRA TEST test strip, TEST DAILY AS DIRECTED, Disp: 100 each, Rfl: 0 .  ONETOUCH DELICA LANCETS 10U MISC, TEST DAILY AS DIRECTED, Disp: , Rfl: 0 .  tadalafil (CIALIS) 5 MG tablet, Take 1-2 tablets (5-10 mg total) by mouth daily as needed for erectile dysfunction., Disp: 10 tablet, Rfl: 11 .  atorvastatin (LIPITOR) 10 MG tablet, Take 1 tablet (10 mg total) by mouth daily., Disp: 90 tablet, Rfl: 2 .  benzonatate (TESSALON) 100 MG capsule, Take 1 capsule (100 mg total) by mouth 3 (three) times daily as needed for cough., Disp: 30 capsule, Rfl: 0  EXAM:  VITALS per patient if applicable: Wt 270 lb (122.5 kg)   BMI 41.05 kg/m   GENERAL: alert, oriented, appears well and in no acute distress  HEENT: atraumatic, conjunttiva clear, no obvious abnormalities on inspection of external nose and  ears  NECK: normal movements of the head and neck  LUNGS: on inspection no signs of respiratory distress, breathing rate appears normal, no obvious gross SOB, gasping or wheezing  CV: no obvious cyanosis  MS: moves all visible extremities without noticeable abnormality  PSYCH/NEURO: pleasant and cooperative, no obvious depression or anxiety, speech and thought processing grossly intact  ASSESSMENT AND PLAN:  Discussed the following assessment and plan:  Fever Symptoms likely related to 2nd dose of COVID vaccine.  Improving at this point.  Recommend continued supportive care at  home.  I have sent over tessalon as needed for cough.  He will schedule f/u for diabetes once feeling better.   30 minutes spent including pre visit preparation, review of prior notes and labs, encounter with patient via video visit and same day documentation.     I discussed the assessment and treatment plan with the patient. The patient was provided an opportunity to ask questions and all were answered. The patient agreed with the plan and demonstrated an understanding of the instructions.   The patient was advised to call back or seek an in-person evaluation if the symptoms worsen or if the condition fails to improve as anticipated.    Luetta Nutting, DO

## 2019-12-05 NOTE — Progress Notes (Signed)
Cold sx - dry cough - runny nose - sore throat  Had a fever over the weekend. Getting better. Had 2nd COVID shot on Friday. Woke up Saturday morning with sx.  No medications taken.

## 2020-01-10 ENCOUNTER — Other Ambulatory Visit: Payer: Self-pay | Admitting: Family Medicine

## 2020-01-10 DIAGNOSIS — F4001 Agoraphobia with panic disorder: Secondary | ICD-10-CM

## 2020-01-10 DIAGNOSIS — F4010 Social phobia, unspecified: Secondary | ICD-10-CM

## 2020-01-17 ENCOUNTER — Encounter: Payer: Self-pay | Admitting: Family Medicine

## 2020-01-17 ENCOUNTER — Ambulatory Visit (INDEPENDENT_AMBULATORY_CARE_PROVIDER_SITE_OTHER): Payer: No Typology Code available for payment source | Admitting: Family Medicine

## 2020-01-17 VITALS — BP 154/91 | HR 90 | Temp 98.3°F | Ht 68.11 in | Wt 266.9 lb

## 2020-01-17 DIAGNOSIS — E785 Hyperlipidemia, unspecified: Secondary | ICD-10-CM

## 2020-01-17 DIAGNOSIS — E1159 Type 2 diabetes mellitus with other circulatory complications: Secondary | ICD-10-CM | POA: Diagnosis not present

## 2020-01-17 DIAGNOSIS — I152 Hypertension secondary to endocrine disorders: Secondary | ICD-10-CM

## 2020-01-17 DIAGNOSIS — F4010 Social phobia, unspecified: Secondary | ICD-10-CM

## 2020-01-17 DIAGNOSIS — F4001 Agoraphobia with panic disorder: Secondary | ICD-10-CM

## 2020-01-17 DIAGNOSIS — IMO0002 Reserved for concepts with insufficient information to code with codable children: Secondary | ICD-10-CM

## 2020-01-17 DIAGNOSIS — E1129 Type 2 diabetes mellitus with other diabetic kidney complication: Secondary | ICD-10-CM | POA: Diagnosis not present

## 2020-01-17 DIAGNOSIS — E1165 Type 2 diabetes mellitus with hyperglycemia: Secondary | ICD-10-CM

## 2020-01-17 DIAGNOSIS — R6882 Decreased libido: Secondary | ICD-10-CM | POA: Diagnosis not present

## 2020-01-17 DIAGNOSIS — E1169 Type 2 diabetes mellitus with other specified complication: Secondary | ICD-10-CM | POA: Diagnosis not present

## 2020-01-17 DIAGNOSIS — R809 Proteinuria, unspecified: Secondary | ICD-10-CM

## 2020-01-17 DIAGNOSIS — I10 Essential (primary) hypertension: Secondary | ICD-10-CM

## 2020-01-17 MED ORDER — CLONAZEPAM 1 MG PO TABS: 1.0000 mg | ORAL_TABLET | Freq: Two times a day (BID) | ORAL | 3 refills | Status: DC | PRN

## 2020-01-17 MED ORDER — ATORVASTATIN CALCIUM 10 MG PO TABS: 10.0000 mg | ORAL_TABLET | Freq: Every day | ORAL | 2 refills | Status: DC

## 2020-01-17 MED ORDER — LEVEMIR FLEXTOUCH 100 UNIT/ML ~~LOC~~ SOPN: 44.0000 [IU] | PEN_INJECTOR | Freq: Every evening | SUBCUTANEOUS | 2 refills | Status: DC

## 2020-01-17 MED ORDER — TRULICITY 1.5 MG/0.5ML ~~LOC~~ SOAJ: SUBCUTANEOUS | 1 refills | Status: DC

## 2020-01-17 MED ORDER — LOSARTAN POTASSIUM 100 MG PO TABS: 100.0000 mg | ORAL_TABLET | Freq: Every day | ORAL | 1 refills | Status: DC

## 2020-01-17 MED ORDER — AMLODIPINE BESYLATE 5 MG PO TABS
5.0000 mg | ORAL_TABLET | Freq: Every day | ORAL | 3 refills | Status: DC
Start: 2020-01-17 — End: 2020-10-31

## 2020-01-17 MED ORDER — BD PEN NEEDLE NANO U/F 32G X 4 MM MISC: 1 refills | Status: DC

## 2020-01-17 NOTE — Assessment & Plan Note (Signed)
Blood pressure is not at goal at for age and co-morbidities.  I recommend he continue losartan with the addition of amlodipine 5mg .  In addition they were instructed to follow a low sodium diet with regular exercise to help to maintain adequate control of blood pressure.

## 2020-01-17 NOTE — Assessment & Plan Note (Signed)
He is tolerating atorvastatin well.  Update lipid panel. 

## 2020-01-17 NOTE — Progress Notes (Signed)
Albert [name] - 49 y.o. male MRN [id number]  Date of birth: July 31, 1970  Subjective Chief Complaint  Patient presents with  . Anxiety  . Hypertension  . Diabetes    HPI Albert [name] is a 49 y.o. male here today for follow up visit.   -HTN: Current treatment with losartan.  BP readings at home similar to readings in clinic today.  He denies chest pain,, shortness of breath, palpitations, headache or vision changes.   -T2DM:  Diabetes poorly controlled at last visit due to being out of medication.  Now that he is using the regularly he reports that readings are  Much improved.  States average blood sugars are around 115-120. He denies hypoglycemia.  He is taking 44 units of levemir each day.   -HLD:  Current management with atorvastatin.  He is tolerating this well and denies myalgias.  He is due for updated lipid panel.   -Panic disorder:  Current management with clonazepam 1-2x per day as needed.  He has tried traditional SSRI and SNRI medication previously but had side effects or were not effective for him.  He has needed clonazepam a little more frequently on some days.  He denies side effects from this.  ROS:  A comprehensive ROS was completed and negative except as noted per HPI    Allergies  Allergen Reactions  . Metformin And Related Nausea Only    Past Medical History:  Diagnosis Date  . ADHD   . Anxiety   . Depression   . Diabetes mellitus without complication ([facility])   . Dyslipidemia associated with type 2 diabetes mellitus ([facility]) 01/21/2017  . Erectile dysfunction 08/04/2016  . Insomnia 06/29/2016  . Low libido 08/04/2016  . Microalbuminuria   . Shoulder dislocation 2008   Left shoulder  . Social anxiety disorder   . Tobacco use disorder     Past Surgical History:  Procedure Laterality Date  . NO PAST SURGERIES      Social History   Socioeconomic History  . Marital status: Significant Other    Spouse name: Not on file  . Number of children: Not on file  .  Years of education: Not on file  . Highest education level: Not on file  Occupational History  . Not on file  Tobacco Use  . Smoking status: Former Smoker    Packs/day: 1.00    Years: 25.00    Pack years: 25.00    Types: Cigarettes    Quit date: 04/23/2017    Years since quitting: 2.7  . Smokeless tobacco: Never Used  Vaping Use  . Vaping Use: Every day  . Substances: Nicotine  Substance and Sexual Activity  . Alcohol use: No    Comment: Drank 6-10 beers. Last drank during Christmas  . Drug use: No  . Sexual activity: Yes    Partners: Female  Other Topics Concern  . Not on file  Social History Narrative  . Not on file   Social Determinants of Health   Financial Resource Strain:   . Difficulty of Paying Living Expenses:   Food Insecurity:   . Worried About Programme researcher, broadcasting/film/video in the Last Year:   . Barista in the Last Year:   Transportation Needs:   . Freight forwarder (Medical):   Marland Kitchen Lack of Transportation (Non-Medical):   Physical Activity:   . Days of Exercise per Week:   . Minutes of Exercise per Session:   Stress:   . Feeling of Stress :  Social Connections:   . Frequency of Communication with Friends and Family:   . Frequency of Social Gatherings with Friends and Family:   . Attends Religious Services:   . Active Member of Clubs or Organizations:   . Attends Banker Meetings:   Marland Kitchen Marital Status:     Family History  Problem Relation Age of Onset  . Hypertension Mother   . Diabetes type II Mother   . Hypertension Sister   . Hypertension Maternal Grandmother   . Diabetes type II Maternal Grandmother   . Breast cancer Paternal Grandfather   . Breast cancer Paternal Grandmother     Health Maintenance  Topic Date Due  . Hepatitis C Screening  Never done  . COVID-19 Vaccine (1) Never done  . OPHTHALMOLOGY EXAM  11/11/2019  . HIV Screening  08/28/2020 (Originally 03/28/1987)  . HEMOGLOBIN A1C  02/26/2020  . FOOT EXAM   08/28/2020  . TETANUS/TDAP  12/05/2028  . PNEUMOCOCCAL POLYSACCHARIDE VACCINE AGE 56-64 HIGH RISK  Completed  . INFLUENZA VACCINE  Discontinued     ----------------------------------------------------------------------------------------------------------------------------------------------------------------------------------------------------------------- Physical Exam BP (!) 154/91 (BP Location: Left Arm, Patient Position: Sitting, Cuff Size: Large)   Pulse 90   Temp 98.3 F (36.8 C) (Oral)   Ht 5' 8.11" (1.73 m)   Wt 266 lb 14.4 oz (121.1 kg)   SpO2 95%   BMI 40.45 kg/m   Physical Exam Constitutional:      Appearance: Normal appearance.  Cardiovascular:     Rate and Rhythm: Normal rate and regular rhythm.  Pulmonary:     Effort: Pulmonary effort is normal.     Breath sounds: Normal breath sounds.  Musculoskeletal:     Cervical back: Neck supple.  Neurological:     General: No focal deficit present.     Mental Status: He is alert.  Psychiatric:        Mood and Affect: Mood normal.        Behavior: Behavior normal.     ------------------------------------------------------------------------------------------------------------------------------------------------------------------------------------------------------------------- Assessment and Plan  Hypertension associated with diabetes ([facility]) Blood pressure is not at goal at for age and co-morbidities.  I recommend he continue losartan with the addition of amlodipine 5mg .  In addition they were instructed to follow a low sodium diet with regular exercise to help to maintain adequate control of blood pressure.    Dyslipidemia associated with type 2 diabetes mellitus ([facility]) He is tolerating atorvastatin well.  Update lipid panel.   Uncontrolled type 2 diabetes mellitus with microalbuminuria, without long-term current use of insulin ([facility]) Diabetes not well controlled at last visit but home readings recently are better.   We'll update A1c today.   Panic disorder with agoraphobia He is using clonazepam a little more.  Will increase to #45/month.  If needing more than this we'll need to revisit SSRI/SNRI class.     Meds ordered this encounter  Medications  . clonazePAM (KLONOPIN) 1 MG tablet    Sig: Take 1 tablet (1 mg total) by mouth 2 (two) times daily as needed for anxiety.    Dispense:  45 tablet    Refill:  3    This request is for a new prescription for a controlled substance as required by Federal/State law.  . Dulaglutide (TRULICITY) 1.5 MG/0.5ML SOPN    Sig: INJECT 1.5 MG INTO THE SKIN ONCE A WEEK.    Dispense:  12 pen    Refill:  1  . insulin detemir (LEVEMIR FLEXTOUCH) 100 UNIT/ML FlexPen    Sig:  Inject 44 Units into the skin every evening.    Dispense:  40 mL    Refill:  2  . Insulin Pen Needle (BD PEN NEEDLE NANO U/F) 32G X 4 MM MISC    Sig: USE SUBCUTANEOUSLY DAILY WITH LEVEMIR    Dispense:  100 each    Refill:  1  . losartan (COZAAR) 100 MG tablet    Sig: Take 1 tablet (100 mg total) by mouth daily.    Dispense:  90 tablet    Refill:  1  . atorvastatin (LIPITOR) 10 MG tablet    Sig: Take 1 tablet (10 mg total) by mouth daily.    Dispense:  90 tablet    Refill:  2  . amLODipine (NORVASC) 5 MG tablet    Sig: Take 1 tablet (5 mg total) by mouth daily.    Dispense:  90 tablet    Refill:  3    Return in about 3 months (around 04/18/2020) for HTN/DM.    This visit occurred during the SARS-CoV-2 public health emergency.  Safety protocols were in place, including screening questions prior to the visit, additional usage of staff PPE, and extensive cleaning of exam room while observing appropriate contact time as indicated for disinfecting solutions.

## 2020-01-17 NOTE — Assessment & Plan Note (Signed)
He is using clonazepam a little more.  Will increase to #45/month.  If needing more than this we'll need to revisit SSRI/SNRI class.

## 2020-01-17 NOTE — Patient Instructions (Signed)
Continue current medications I am recommend adding amlodipine to help with BP control.  Have labs completed.  See me again in 3 months.

## 2020-01-17 NOTE — Assessment & Plan Note (Signed)
Diabetes not well controlled at last visit but home readings recently are better.  We'll update A1c today.

## 2020-01-18 LAB — LIPID PANEL
Cholesterol: 156 mg/dL (ref <200)
HDL: 42 mg/dL (ref >=40)
LDL Cholesterol (Calc): 94 mg/dL (calc)
Non-HDL Cholesterol (Calc): 114 mg/dL (calc) (ref <130)
Total CHOL/HDL Ratio: 3.7 (calc) (ref <5.0)
Triglycerides: 103 mg/dL (ref <150)

## 2020-01-18 LAB — COMPLETE METABOLIC PANEL WITH GFR
AG Ratio: 2 (calc) (ref 1.0–2.5)
ALT: 70 U/L — ABNORMAL HIGH (ref 9–46)
AST: 49 U/L — ABNORMAL HIGH (ref 10–40)
Albumin: 4.3 g/dL (ref 3.6–5.1)
Alkaline phosphatase (APISO): 78 U/L (ref 36–130)
BUN: 16 mg/dL (ref 7–25)
CO2: 25 mmol/L (ref 20–32)
Calcium: 9.5 mg/dL (ref 8.6–10.3)
Chloride: 106 mmol/L (ref 98–110)
Creat: 0.7 mg/dL (ref 0.60–1.35)
GFR, Est African American: 130 mL/min/{1.73_m2} (ref >=60)
GFR, Est Non African American: 112 mL/min/{1.73_m2} (ref >=60)
Globulin: 2.2 g/dL (calc) (ref 1.9–3.7)
Glucose, Bld: 130 mg/dL — ABNORMAL HIGH (ref 65–99)
Potassium: 3.6 mmol/L (ref 3.5–5.3)
Sodium: 141 mmol/L (ref 135–146)
Total Bilirubin: 0.5 mg/dL (ref 0.2–1.2)
Total Protein: 6.5 g/dL (ref 6.1–8.1)

## 2020-01-18 LAB — TESTOSTERONE: Testosterone: 201 ng/dL — ABNORMAL LOW (ref 250–827)

## 2020-01-18 LAB — HEMOGLOBIN A1C
Hgb A1c MFr Bld: 8.2 % of total Hgb — ABNORMAL HIGH (ref <5.7)
Mean Plasma Glucose: 189 (calc)
eAG (mmol/L): 10.4 (calc)

## 2020-01-25 ENCOUNTER — Other Ambulatory Visit: Payer: Self-pay

## 2020-01-25 ENCOUNTER — Other Ambulatory Visit: Payer: Self-pay | Admitting: Neurology

## 2020-01-25 DIAGNOSIS — R7989 Other specified abnormal findings of blood chemistry: Secondary | ICD-10-CM

## 2020-02-14 LAB — TESTOSTERONE: Testosterone: 208 ng/dL — ABNORMAL LOW (ref 250–827)

## 2020-02-20 ENCOUNTER — Encounter: Payer: Self-pay | Admitting: Family Medicine

## 2020-02-20 NOTE — Telephone Encounter (Signed)
Routing to covering provider.  °

## 2020-03-05 ENCOUNTER — Encounter: Payer: Self-pay | Admitting: Family Medicine

## 2020-03-05 ENCOUNTER — Ambulatory Visit (INDEPENDENT_AMBULATORY_CARE_PROVIDER_SITE_OTHER): Payer: No Typology Code available for payment source | Admitting: Family Medicine

## 2020-03-05 VITALS — BP 129/86 | HR 97 | Temp 97.9°F | Ht 68.0 in | Wt 272.2 lb

## 2020-03-05 DIAGNOSIS — R7989 Other specified abnormal findings of blood chemistry: Secondary | ICD-10-CM | POA: Diagnosis not present

## 2020-03-05 LAB — CBC
HCT: 45.1 % (ref 38.5–50.0)
Hemoglobin: 15.3 g/dL (ref 13.2–17.1)
MCH: 32.5 pg (ref 27.0–33.0)
MCHC: 33.9 g/dL (ref 32.0–36.0)
MCV: 95.8 fL (ref 80.0–100.0)
MPV: 10.1 fL (ref 7.5–12.5)
Platelets: 209 10*3/uL (ref 140–400)
RBC: 4.71 10*6/uL (ref 4.20–5.80)
RDW: 12 % (ref 11.0–15.0)
WBC: 8 10*3/uL (ref 3.8–10.8)

## 2020-03-05 LAB — PSA: PSA: 0.2 ng/mL (ref <=4.0)

## 2020-03-05 MED ORDER — TESTOSTERONE 50 MG/5GM (1%) TD GEL: 5.0000 g | Freq: Every day | TRANSDERMAL | 3 refills | Status: DC

## 2020-03-05 NOTE — Patient Instructions (Signed)
Start topical tesosterone.  Push back follow up appt to November.

## 2020-03-05 NOTE — Assessment & Plan Note (Signed)
Start androgel 50mg  daily.   Will get baseline CBC and PSA levels today.  Follow up in 8-10 weeks.

## 2020-03-05 NOTE — Progress Notes (Signed)
Albert [name] - 49 y.o. male MRN [id number]  Date of birth: [birth year]  Subjective Chief Complaint  Patient presents with  . Hypogonadism    HPI Albert [name] is a 49 y.o. male here today for follow up of recent labs showing low testosterone levels.  He does also have a history of diabetes and obesity.  He is interested in trying testosterone supplementation as he is still having symptoms of fatigue.  He doesn't really like the idea of doing an injection himself and doesn't have time to come into clinic every couple of weeks for injection.  He would like to try topical testosterone.   ROS:  A comprehensive ROS was completed and negative except as noted per HPI  Allergies  Allergen Reactions  . Metformin And Related Nausea Only    Past Medical History:  Diagnosis Date  . ADHD   . Anxiety   . Depression   . Diabetes mellitus without complication ([facility])   . Dyslipidemia associated with type 2 diabetes mellitus ([facility]) 01/21/2017  . Erectile dysfunction 08/04/2016  . Insomnia 06/29/2016  . Low libido 08/04/2016  . Microalbuminuria   . Shoulder dislocation 2008   Left shoulder  . Social anxiety disorder   . Tobacco use disorder     Past Surgical History:  Procedure Laterality Date  . NO PAST SURGERIES      Social History   Socioeconomic History  . Marital status: Significant Other    Spouse name: Not on file  . Number of children: Not on file  . Years of education: Not on file  . Highest education level: Not on file  Occupational History  . Not on file  Tobacco Use  . Smoking status: Former Smoker    Packs/day: 1.00    Years: 25.00    Pack years: 25.00    Types: Cigarettes    Quit date: 04/23/2017    Years since quitting: 2.8  . Smokeless tobacco: Never Used  Vaping Use  . Vaping Use: Every day  . Substances: Nicotine  Substance and Sexual Activity  . Alcohol use: No    Comment: Drank 6-10 beers. Last drank during Christmas  . Drug use: No  . Sexual activity:  Yes    Partners: Female  Other Topics Concern  . Not on file  Social History Narrative  . Not on file   Social Determinants of Health   Financial Resource Strain:   . Difficulty of Paying Living Expenses: Not on file  Food Insecurity:   . Worried About Programme researcher, broadcasting/film/video in the Last Year: Not on file  . Ran Out of Food in the Last Year: Not on file  Transportation Needs:   . Lack of Transportation (Medical): Not on file  . Lack of Transportation (Non-Medical): Not on file  Physical Activity:   . Days of Exercise per Week: Not on file  . Minutes of Exercise per Session: Not on file  Stress:   . Feeling of Stress : Not on file  Social Connections:   . Frequency of Communication with Friends and Family: Not on file  . Frequency of Social Gatherings with Friends and Family: Not on file  . Attends Religious Services: Not on file  . Active Member of Clubs or Organizations: Not on file  . Attends Banker Meetings: Not on file  . Marital Status: Not on file    Family History  Problem Relation Age of Onset  . Hypertension Mother   . Diabetes  type II Mother   . Hypertension Sister   . Hypertension Maternal Grandmother   . Diabetes type II Maternal Grandmother   . Breast cancer Paternal Grandfather   . Breast cancer Paternal Grandmother     Health Maintenance  Topic Date Due  . Hepatitis C Screening  Never done  . OPHTHALMOLOGY EXAM  11/11/2019  . HIV Screening  08/28/2020 (Originally 03/28/1987)  . HEMOGLOBIN A1C  07/19/2020  . FOOT EXAM  08/28/2020  . TETANUS/TDAP  12/05/2028  . PNEUMOCOCCAL POLYSACCHARIDE VACCINE AGE 69-64 HIGH RISK  Completed  . COVID-19 Vaccine  Completed  . INFLUENZA VACCINE  Discontinued     ----------------------------------------------------------------------------------------------------------------------------------------------------------------------------------------------------------------- Physical Exam BP 129/86 (BP Location:  Left Arm, Patient Position: Sitting, Cuff Size: Large)   Pulse 97   Temp 97.9 F (36.6 C) (Temporal)   Ht 5\' 8"  (1.727 m)   Wt 272 lb 3.2 oz (123.5 kg)   SpO2 97%   BMI 41.39 kg/m   Physical Exam Constitutional:      Appearance: Normal appearance.  Skin:    General: Skin is warm and dry.  Neurological:     General: No focal deficit present.     Mental Status: He is alert.  Psychiatric:        Mood and Affect: Mood normal.        Behavior: Behavior normal.     ------------------------------------------------------------------------------------------------------------------------------------------------------------------------------------------------------------------- Assessment and Plan  Low testosterone Start androgel 50mg  daily.   Will get baseline CBC and PSA levels today.  Follow up in 8-10 weeks.    Meds ordered this encounter  Medications  . testosterone (ANDROGEL) 50 MG/5GM (1%) GEL    Sig: Place 5 g onto the skin daily. Apply to shoulders and upper arms    Dispense:  150 g    Refill:  3    No follow-ups on file.    This visit occurred during the SARS-CoV-2 public health emergency.  Safety protocols were in place, including screening questions prior to the visit, additional usage of staff PPE, and extensive cleaning of exam room while observing appropriate contact time as indicated for disinfecting solutions.

## 2020-04-18 ENCOUNTER — Ambulatory Visit: Payer: No Typology Code available for payment source | Admitting: Family Medicine

## 2020-05-21 ENCOUNTER — Ambulatory Visit: Payer: No Typology Code available for payment source | Admitting: Family Medicine

## 2020-05-28 ENCOUNTER — Ambulatory Visit (INDEPENDENT_AMBULATORY_CARE_PROVIDER_SITE_OTHER): Payer: No Typology Code available for payment source | Admitting: Family Medicine

## 2020-05-28 ENCOUNTER — Encounter: Payer: Self-pay | Admitting: Family Medicine

## 2020-05-28 VITALS — BP 151/89 | HR 99 | Temp 97.6°F | Wt 273.2 lb

## 2020-05-28 DIAGNOSIS — Z794 Long term (current) use of insulin: Secondary | ICD-10-CM

## 2020-05-28 DIAGNOSIS — E1159 Type 2 diabetes mellitus with other circulatory complications: Secondary | ICD-10-CM

## 2020-05-28 DIAGNOSIS — E1169 Type 2 diabetes mellitus with other specified complication: Secondary | ICD-10-CM

## 2020-05-28 DIAGNOSIS — R7989 Other specified abnormal findings of blood chemistry: Secondary | ICD-10-CM

## 2020-05-28 DIAGNOSIS — F4010 Social phobia, unspecified: Secondary | ICD-10-CM

## 2020-05-28 DIAGNOSIS — E1129 Type 2 diabetes mellitus with other diabetic kidney complication: Secondary | ICD-10-CM | POA: Diagnosis not present

## 2020-05-28 DIAGNOSIS — R809 Proteinuria, unspecified: Secondary | ICD-10-CM

## 2020-05-28 DIAGNOSIS — E785 Hyperlipidemia, unspecified: Secondary | ICD-10-CM

## 2020-05-28 DIAGNOSIS — IMO0002 Reserved for concepts with insufficient information to code with codable children: Secondary | ICD-10-CM

## 2020-05-28 DIAGNOSIS — E1165 Type 2 diabetes mellitus with hyperglycemia: Secondary | ICD-10-CM | POA: Diagnosis not present

## 2020-05-28 DIAGNOSIS — I152 Hypertension secondary to endocrine disorders: Secondary | ICD-10-CM

## 2020-05-28 LAB — POCT GLYCOSYLATED HEMOGLOBIN (HGB A1C): Hemoglobin A1C: 7.8 % — AB (ref 4.0–5.6)

## 2020-05-28 MED ORDER — TRULICITY 3 MG/0.5ML ~~LOC~~ SOAJ: 3.0000 mg | SUBCUTANEOUS | 1 refills | Status: AC

## 2020-05-28 MED ORDER — BUSPIRONE HCL 10 MG PO TABS: 10.0000 mg | ORAL_TABLET | Freq: Three times a day (TID) | ORAL | 1 refills | Status: DC

## 2020-05-28 NOTE — Assessment & Plan Note (Signed)
A1c improved since last check.  Increase trulicity to 3mg  weekly.  Continue levemir at current dose Recommend increased exercise and low carbohydrate diet.

## 2020-05-28 NOTE — Assessment & Plan Note (Signed)
Continued anxiety.  He has tried and failed several SSRI's without improvement.  Will add buspar and will continue clonazepam Discussed addition of counseling, he will consider if not seeing improvement with medication.

## 2020-05-28 NOTE — Patient Instructions (Addendum)
Please increase trulicity to 3mg  daily Let's try buspar for anxiety.  You can continue to use clonazepam as needed as well.  Have labs completed Let's plan to follow up in about 6 weeks and see how the change is doing.

## 2020-05-28 NOTE — Progress Notes (Signed)
Albert [name] - 49 y.o. male MRN [id number]  Date of birth: 03-14-1971  Subjective Chief Complaint  Patient presents with  . Hypertension  . Anxiety  . Diabetes    HPI Albert [name] is a 49 y.o. male with history of HTN, T2DM, HLD, and anxiety here today for follow up visit.    Diabetes currently managed with trulicity 1.5mg  and levemir 40 units daily.  He is not monitoring blood sugars regularly.  He has not had any symptoms of hypoglycemia.  He is taking atorvastatin for associated HLD and is tolerating this well.   BP treated with amlodipine and losartan.  Doing well with this.  BP readings at home have been better controlled.  Anxiety at appointments raises his blood pressure.   He denies chest pain, shortness of breath, headache, or vision changes.  He doesn't feel like anxiety is well controlled.  He has tried several SSRI medications previously however he discontinued due to side effects or they were not effective.  He has remained on clonazepam but he feels like this isn't as effective anymore. He has seen psychiatry but did not get along with last one that he saw and would prefer not to return.  He has never seen a therapist.   He was also started on androgel at last appt for low testosterone.  He doesn't feel like this has made much of a difference.    ROS:  A comprehensive ROS was completed and negative except as noted per HPI  Allergies  Allergen Reactions  . Metformin And Related Nausea Only    Past Medical History:  Diagnosis Date  . ADHD   . Anxiety   . Depression   . Diabetes mellitus without complication (HCC)   . Dyslipidemia associated with type 2 diabetes mellitus (HCC) 01/21/2017  . Erectile dysfunction 08/04/2016  . Insomnia 06/29/2016  . Low libido 08/04/2016  . Microalbuminuria   . Shoulder dislocation 2008   Left shoulder  . Social anxiety disorder   . Tobacco use disorder     Past Surgical History:  Procedure Laterality Date  . NO PAST SURGERIES       Social History   Socioeconomic History  . Marital status: Significant Other    Spouse name: Not on file  . Number of children: Not on file  . Years of education: Not on file  . Highest education level: Not on file  Occupational History  . Not on file  Tobacco Use  . Smoking status: Former Smoker    Packs/day: 1.00    Years: 25.00    Pack years: 25.00    Types: Cigarettes    Quit date: 04/23/2017    Years since quitting: 3.0  . Smokeless tobacco: Never Used  Vaping Use  . Vaping Use: Every day  . Substances: Nicotine  Substance and Sexual Activity  . Alcohol use: No    Comment: Drank 6-10 beers. Last drank during Christmas  . Drug use: No  . Sexual activity: Yes    Partners: Female  Other Topics Concern  . Not on file  Social History Narrative  . Not on file   Social Determinants of Health   Financial Resource Strain:   . Difficulty of Paying Living Expenses: Not on file  Food Insecurity:   . Worried About Programme researcher, broadcasting/film/video in the Last Year: Not on file  . Ran Out of Food in the Last Year: Not on file  Transportation Needs:   . Lack of Transportation (  Medical): Not on file  . Lack of Transportation (Non-Medical): Not on file  Physical Activity:   . Days of Exercise per Week: Not on file  . Minutes of Exercise per Session: Not on file  Stress:   . Feeling of Stress : Not on file  Social Connections:   . Frequency of Communication with Friends and Family: Not on file  . Frequency of Social Gatherings with Friends and Family: Not on file  . Attends Religious Services: Not on file  . Active Member of Clubs or Organizations: Not on file  . Attends Banker Meetings: Not on file  . Marital Status: Not on file    Family History  Problem Relation Age of Onset  . Hypertension Mother   . Diabetes type II Mother   . Hypertension Sister   . Hypertension Maternal Grandmother   . Diabetes type II Maternal Grandmother   . Breast cancer Paternal  Grandfather   . Breast cancer Paternal Grandmother     Health Maintenance  Topic Date Due  . Hepatitis C Screening  Never done  . OPHTHALMOLOGY EXAM  11/11/2019  . HIV Screening  08/28/2020 (Originally 03/28/1987)  . FOOT EXAM  08/28/2020  . HEMOGLOBIN A1C  11/25/2020  . TETANUS/TDAP  12/05/2028  . PNEUMOCOCCAL POLYSACCHARIDE VACCINE AGE 48-64 HIGH RISK  Completed  . COVID-19 Vaccine  Completed  . INFLUENZA VACCINE  Discontinued     ----------------------------------------------------------------------------------------------------------------------------------------------------------------------------------------------------------------- Physical Exam BP (!) 151/89 (BP Location: Left Arm, Patient Position: Sitting, Cuff Size: Large)   Pulse 99   Temp 97.6 F (36.4 C)   Wt 273 lb 3.2 oz (123.9 kg)   SpO2 99%   BMI 41.54 kg/m   Physical Exam Constitutional:      Appearance: Normal appearance.  HENT:     Head: Normocephalic and atraumatic.  Eyes:     General: No scleral icterus. Cardiovascular:     Rate and Rhythm: Normal rate and regular rhythm.  Pulmonary:     Effort: Pulmonary effort is normal.     Breath sounds: Normal breath sounds.  Musculoskeletal:     Cervical back: Neck supple.  Skin:    General: Skin is warm and dry.  Neurological:     General: No focal deficit present.     Mental Status: He is alert.  Psychiatric:        Mood and Affect: Mood normal.        Behavior: Behavior normal.     ------------------------------------------------------------------------------------------------------------------------------------------------------------------------------------------------------------------- Assessment and Plan  Hypertension associated with diabetes (HCC) BP elevated, readings improved at home.  Recommend low sodium diet.  Will recheck at f/u in 6 weeks.   Dyslipidemia associated with type 2 diabetes mellitus [facility]) Lab Results  Component  Value Date   LDLCALC 94 01/17/2020  Tolerating atorvastatin well, continue.   Type 2 diabetes mellitus with microalbuminuria, with long-term current use of insulin (HCC) A1c improved since last check.  Increase trulicity to 3mg  weekly.  Continue levemir at current dose Recommend increased exercise and low carbohydrate diet.    Social anxiety disorder Continued anxiety.  He has tried and failed several SSRI's without improvement.  Will add buspar and will continue clonazepam Discussed addition of counseling, he will consider if not seeing improvement with medication.   Low testosterone Update testosterone levels    Meds ordered this encounter  Medications  . busPIRone (BUSPAR) 10 MG tablet    Sig: Take 1 tablet (10 mg total) by mouth 3 (three) times daily.  Dispense:  90 tablet    Refill:  1  . Dulaglutide (TRULICITY) 3 MG/0.5ML SOPN    Sig: Inject 3 mg as directed once a week.    Dispense:  6 mL    Refill:  1    Return in about 6 weeks (around 07/09/2020) for anxiety.    This visit occurred during the SARS-CoV-2 public health emergency.  Safety protocols were in place, including screening questions prior to the visit, additional usage of staff PPE, and extensive cleaning of exam room while observing appropriate contact time as indicated for disinfecting solutions.

## 2020-05-28 NOTE — Assessment & Plan Note (Signed)
BP elevated, readings improved at home.  Recommend low sodium diet.  Will recheck at f/u in 6 weeks.

## 2020-05-28 NOTE — Assessment & Plan Note (Signed)
Update testosterone levels.  

## 2020-05-28 NOTE — Assessment & Plan Note (Signed)
Lab Results  Component Value Date   LDLCALC 94 01/17/2020  Tolerating atorvastatin well, continue.

## 2020-05-29 LAB — COMPLETE METABOLIC PANEL WITH GFR
AG Ratio: 1.9 (calc) (ref 1.0–2.5)
ALT: 60 U/L — ABNORMAL HIGH (ref 9–46)
AST: 44 U/L — ABNORMAL HIGH (ref 10–40)
Albumin: 4.7 g/dL (ref 3.6–5.1)
Alkaline phosphatase (APISO): 82 U/L (ref 36–130)
BUN: 15 mg/dL (ref 7–25)
CO2: 25 mmol/L (ref 20–32)
Calcium: 9.7 mg/dL (ref 8.6–10.3)
Chloride: 99 mmol/L (ref 98–110)
Creat: 1.02 mg/dL (ref 0.60–1.35)
GFR, Est African American: 100 mL/min/{1.73_m2} (ref >=60)
GFR, Est Non African American: 87 mL/min/{1.73_m2} (ref >=60)
Globulin: 2.5 g/dL (calc) (ref 1.9–3.7)
Glucose, Bld: 214 mg/dL — ABNORMAL HIGH (ref 65–99)
Potassium: 3.9 mmol/L (ref 3.5–5.3)
Sodium: 139 mmol/L (ref 135–146)
Total Bilirubin: 1.1 mg/dL (ref 0.2–1.2)
Total Protein: 7.2 g/dL (ref 6.1–8.1)

## 2020-05-29 LAB — CBC
HCT: 52 % — ABNORMAL HIGH (ref 38.5–50.0)
Hemoglobin: 17.5 g/dL — ABNORMAL HIGH (ref 13.2–17.1)
MCH: 31.4 pg (ref 27.0–33.0)
MCHC: 33.7 g/dL (ref 32.0–36.0)
MCV: 93.4 fL (ref 80.0–100.0)
MPV: 10 fL (ref 7.5–12.5)
Platelets: 230 10*3/uL (ref 140–400)
RBC: 5.57 10*6/uL (ref 4.20–5.80)
RDW: 12.5 % (ref 11.0–15.0)
WBC: 8.3 10*3/uL (ref 3.8–10.8)

## 2020-05-29 LAB — TESTOSTERONE: Testosterone: 442 ng/dL (ref 250–827)

## 2020-06-01 ENCOUNTER — Other Ambulatory Visit: Payer: Self-pay | Admitting: Family Medicine

## 2020-06-01 DIAGNOSIS — F4010 Social phobia, unspecified: Secondary | ICD-10-CM

## 2020-06-01 DIAGNOSIS — F4001 Agoraphobia with panic disorder: Secondary | ICD-10-CM

## 2020-06-15 ENCOUNTER — Other Ambulatory Visit: Payer: Self-pay | Admitting: Physician Assistant

## 2020-06-24 ENCOUNTER — Other Ambulatory Visit: Payer: Self-pay | Admitting: Family Medicine

## 2020-07-11 ENCOUNTER — Ambulatory Visit: Payer: No Typology Code available for payment source | Admitting: Family Medicine

## 2020-07-15 ENCOUNTER — Encounter: Payer: Self-pay | Admitting: Family Medicine

## 2020-07-15 ENCOUNTER — Telehealth (INDEPENDENT_AMBULATORY_CARE_PROVIDER_SITE_OTHER): Payer: No Typology Code available for payment source | Admitting: Nurse Practitioner

## 2020-07-15 ENCOUNTER — Encounter: Payer: Self-pay | Admitting: Nurse Practitioner

## 2020-07-15 DIAGNOSIS — R0602 Shortness of breath: Secondary | ICD-10-CM | POA: Diagnosis not present

## 2020-07-15 DIAGNOSIS — Z20822 Contact with and (suspected) exposure to covid-19: Secondary | ICD-10-CM | POA: Diagnosis not present

## 2020-07-15 MED ORDER — ALBUTEROL SULFATE HFA 108 (90 BASE) MCG/ACT IN AERS: 1.0000 | INHALATION_SPRAY | RESPIRATORY_TRACT | 1 refills | Status: DC | PRN

## 2020-07-15 NOTE — Progress Notes (Signed)
Virtual Visit via Telephone Note  I connected with  Albert [name] on 07/15/20 at  2:10 PM EST by telephone and verified that I am speaking with the correct [name] using two identifiers.   I discussed the limitations, risks, security and privacy concerns of performing an evaluation and management service by telephone and the availability of in [name] appointments. I also discussed with the patient that there may be a patient responsible charge related to this service. The patient expressed understanding and agreed to proceed.  Participating parties included in this telephone visit include: The patient and the nurse practitioner listed.  The patient is: At home I am: In the office  Subjective:    CC:  Chief Complaint  Patient presents with  . Cough  . Shortness of Breath     HPI: Albert [name] is a 50 y.o. year old male presenting today via telephone visit to discuss symptoms of slight cough and some shortness of breath, worse with exertion.   He endorses slight cough, shortness of breath (worse with exertion), chills, decreased appetite, fatigue, dizziness, body aches, runny nose, slight headache that started last Thursday. 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 He reports that most of his shortness of breath is with physical exertion, but he does feel some shortness of breath while sitting still.   He denies nausea, vomiting, diarrhea, sinus pain or pressure, ear pain or pressure, loss of taste, loss of smell, chest pain, wheezing, sore throat, or palpitations.   He does report that he works at [employer] and has had several co-workers who have been diagnosed with COVID  Past medical history, Surgical history, Family history not pertinant except as noted below, Social history, Allergies, and medications have been entered into the medical record, reviewed, and corrections made.   Review of Systems:  All review of  systems negative except what is listed in the HPI  Objective:    General:  Patient speaking clearly in complete sentences. No shortness of breath noted while speaking. Mild audible congestion.   Alert and oriented x3.   Normal judgment.  No apparent acute distress.  Impression and Recommendations:    1. Shortness of breath - albuterol (VENTOLIN HFA) 108 (90 Base) MCG/ACT inhaler; Inhale 1-2 puffs into the lungs every 4 (four) hours as needed for wheezing or shortness of breath.  Dispense: 6.7 g; Refill: 1 - Novel Coronavirus, NAA ([facility])  2. Suspected COVID-19 virus infection - albuterol (VENTOLIN HFA) 108 (90 Base) MCG/ACT inhaler; Inhale 1-2 puffs into the lungs every 4 (four) hours as needed for wheezing or shortness of breath.  Dispense: 6.7 g; Refill: 1 - Novel Coronavirus, NAA ([facility])  Symptoms and presentation consistent with possible COVID-19 infection. We will have him come to the office today for COVID testing and to check his oxygen saturations to ensure that he is not dropping below 90%. Recommend Mucinex and Flonase to help with congestion and cough. Increase consumption of liquids to avoid dehydration. Rest as much as possible. Tylenol and Ibuprofen may be alternated for body aches and headaches as needed. Albuterol inhaler 1-2 puffs every 4 hours as needed for shortness of breath and cough.  Will notify patient of test results.  Work note provided to advise work that he is currently under surveillance for COVID.  If shortness of breath worsens or if you feel like you are not getting enough air, seek help by calling 911 or  present to the nearest emergency room.   Return if symptoms worsen or fail to improve.  ADDENDUM: Patient swabbed for COVID-19 and pulse ox was measured at 100%. No concerns for desaturation at this time. Patient to continue to monitor for difficulty breathing and seek emergency care if he feels short of breath at rest or severe shortness of breath  with exertion.    I discussed the assessment and treatment plan with the patient. The patient was provided an opportunity to ask questions and all were answered. The patient agreed with the plan and demonstrated an understanding of the instructions.   The patient was advised to call back or seek an in-[name] evaluation if the symptoms worsen or if the condition fails to improve as anticipated.  I provided 20 minutes of non-face-to-face time during this TELEPHONE encounter.    Tollie Eth, NP

## 2020-07-15 NOTE — Patient Instructions (Signed)
Over the counter medications that may be helpful for symptoms: . Guaifenesin 1200 mg extended release tabs twice daily, with plenty of water o For cough and congestion o Brand name: Mucinex   . Pseudoephedrine 30 mg, one or two tabs every 4 to 6 hours o For sinus congestion o Brand name: Sudafed o You must get this from the pharmacy counter.  . Oxymetazoline nasal spray each morning, one spray in each nostril, for NO MORE THAN 3 days  o For nasal and sinus congestion o Brand name: Afrin . Saline nasal spray or Saline Nasal Irrigation 3-5 times a day o For nasal and sinus congestion o Brand names: Ocean or AYR . Fluticasone nasal spray, one spray in each nostril, each morning after oxymetazoline and saline, if used o For nasal and sinus congestion o Brand name: Flonase . Warm salt water gargles  o For sore throat o Every few hours as needed . Alternate ibuprofen 400-600 mg and acetaminophen 1000 mg every 4-6 hours o For fever, body aches, headache o Brand names: Motrin or Advil and Tylenol . Dextromethorphan 12-hour cough version 30 mg every 12 hours  o For cough o Brand name: Delsym Stop all other cold medications for now (Nyquil, Dayquil, Tylenol Cold, Theraflu, etc) and other non-prescription cough/cold preparations. Many of these have the same ingredients listed above and could cause an overdose of medication.   General Instructions . Allow your body to rest . Drink PLENTY of fluids . Isolate yourself from everyone, even family, until test results have returned  If your COVID-19 test is positive . Then you ARE INFECTED and you can pass the virus to others . You must quarantine from others for a minimum of  o 10 days since symptoms started AND o You are fever free for 24 hours WITHOUT any medication to reduce fever AND o Your symptoms are improving . Do not go to the store or other public areas . Do not go around household members who are not known to be infected with  COVID-19 . If you MUST leave you area of quarantine (example: go to a bathroom you share with others in your home), you must o Wear a mask o Wash your hands thoroughly o Wipe down any surfaces you touch . Do not share food, drinks, towels, or other items with other persons . Dispose of your own tissues, food containers, etc  Once you have recovered, please continue good preventive care measures, including:  . wearing a mask when in public . wash your hands frequently . avoid touching your face/nose/eyes . cover coughs/sneezes with the inside of your elbow . stay out of crowds . keep a 6 foot distance from others  Go to the nearest hospital emergency room if fever/cough/breathlessness are severe or illness seems like a threat to life.   

## 2020-07-16 ENCOUNTER — Encounter: Payer: Self-pay | Admitting: Family Medicine

## 2020-07-17 ENCOUNTER — Other Ambulatory Visit: Payer: Self-pay | Admitting: Family Medicine

## 2020-07-17 MED ORDER — HYDROCODONE-ACETAMINOPHEN 5-325 MG PO TABS: 1.0000 | ORAL_TABLET | Freq: Four times a day (QID) | ORAL | 0 refills | Status: DC | PRN

## 2020-07-18 LAB — NOVEL CORONAVIRUS, NAA: SARS-CoV-2, NAA: NOT DETECTED

## 2020-07-18 NOTE — Progress Notes (Signed)
COVID test was negative.

## 2020-08-01 ENCOUNTER — Other Ambulatory Visit: Payer: Self-pay | Admitting: Family Medicine

## 2020-08-01 DIAGNOSIS — F4010 Social phobia, unspecified: Secondary | ICD-10-CM

## 2020-08-01 DIAGNOSIS — F4001 Agoraphobia with panic disorder: Secondary | ICD-10-CM

## 2020-08-24 ENCOUNTER — Encounter: Payer: Self-pay | Admitting: Family Medicine

## 2020-09-02 ENCOUNTER — Other Ambulatory Visit: Payer: Self-pay | Admitting: Sports Medicine

## 2020-09-02 DIAGNOSIS — F4010 Social phobia, unspecified: Secondary | ICD-10-CM

## 2020-09-02 DIAGNOSIS — F4001 Agoraphobia with panic disorder: Secondary | ICD-10-CM

## 2020-09-13 ENCOUNTER — Other Ambulatory Visit: Payer: Self-pay | Admitting: Family Medicine

## 2020-09-13 ENCOUNTER — Telehealth: Payer: Self-pay | Admitting: Family Medicine

## 2020-09-13 DIAGNOSIS — E1129 Type 2 diabetes mellitus with other diabetic kidney complication: Secondary | ICD-10-CM

## 2020-09-13 DIAGNOSIS — IMO0002 Reserved for concepts with insufficient information to code with codable children: Secondary | ICD-10-CM

## 2020-09-13 NOTE — Telephone Encounter (Signed)
Patient was questioning about getting insulin approved.... that he needed this. He stated he thought the name of it was : insulin detemir (LEVEMIR FLEXTOUCH) 100 UNIT/ML FlexPen

## 2020-09-16 ENCOUNTER — Other Ambulatory Visit: Payer: Self-pay | Admitting: Family Medicine

## 2020-09-16 DIAGNOSIS — IMO0002 Reserved for concepts with insufficient information to code with codable children: Secondary | ICD-10-CM

## 2020-09-16 DIAGNOSIS — E1129 Type 2 diabetes mellitus with other diabetic kidney complication: Secondary | ICD-10-CM

## 2020-09-16 MED ORDER — LEVEMIR FLEXTOUCH 100 UNIT/ML ~~LOC~~ SOPN: 44.0000 [IU] | PEN_INJECTOR | Freq: Every evening | SUBCUTANEOUS | 2 refills | Status: DC

## 2020-09-16 NOTE — Telephone Encounter (Signed)
Rx refilled.

## 2020-09-18 ENCOUNTER — Other Ambulatory Visit: Payer: Self-pay | Admitting: Family Medicine

## 2020-09-18 DIAGNOSIS — E1129 Type 2 diabetes mellitus with other diabetic kidney complication: Secondary | ICD-10-CM

## 2020-09-18 DIAGNOSIS — I152 Hypertension secondary to endocrine disorders: Secondary | ICD-10-CM

## 2020-09-18 DIAGNOSIS — E1159 Type 2 diabetes mellitus with other circulatory complications: Secondary | ICD-10-CM

## 2020-09-18 DIAGNOSIS — IMO0002 Reserved for concepts with insufficient information to code with codable children: Secondary | ICD-10-CM

## 2020-09-18 NOTE — Telephone Encounter (Signed)
Please contact patient for HTN & anxiety follow-up appt.   Sent 30 day refill of HTN medication to hold him until appt.

## 2020-09-18 NOTE — Telephone Encounter (Signed)
Patient has an appt scheduled for 10/11/20 , wanted to keep this appt because he said he is off this day. AM

## 2020-09-23 ENCOUNTER — Other Ambulatory Visit: Payer: Self-pay | Admitting: Family Medicine

## 2020-09-23 DIAGNOSIS — F4010 Social phobia, unspecified: Secondary | ICD-10-CM

## 2020-09-23 DIAGNOSIS — F4001 Agoraphobia with panic disorder: Secondary | ICD-10-CM

## 2020-10-11 ENCOUNTER — Ambulatory Visit: Payer: No Typology Code available for payment source | Admitting: Family Medicine

## 2020-10-11 LAB — HM DIABETES EYE EXAM

## 2020-10-17 ENCOUNTER — Other Ambulatory Visit: Payer: Self-pay | Admitting: Family Medicine

## 2020-10-17 DIAGNOSIS — F4010 Social phobia, unspecified: Secondary | ICD-10-CM

## 2020-10-17 DIAGNOSIS — F4001 Agoraphobia with panic disorder: Secondary | ICD-10-CM

## 2020-10-19 ENCOUNTER — Other Ambulatory Visit: Payer: Self-pay | Admitting: Family Medicine

## 2020-10-19 DIAGNOSIS — I152 Hypertension secondary to endocrine disorders: Secondary | ICD-10-CM

## 2020-10-19 DIAGNOSIS — E1129 Type 2 diabetes mellitus with other diabetic kidney complication: Secondary | ICD-10-CM

## 2020-10-19 DIAGNOSIS — IMO0002 Reserved for concepts with insufficient information to code with codable children: Secondary | ICD-10-CM

## 2020-10-31 ENCOUNTER — Ambulatory Visit: Payer: No Typology Code available for payment source | Admitting: Family Medicine

## 2020-10-31 ENCOUNTER — Encounter: Payer: Self-pay | Admitting: Family Medicine

## 2020-10-31 ENCOUNTER — Other Ambulatory Visit: Payer: Self-pay

## 2020-10-31 VITALS — BP 149/87 | HR 109 | Wt 278.4 lb

## 2020-10-31 DIAGNOSIS — Z794 Long term (current) use of insulin: Secondary | ICD-10-CM

## 2020-10-31 DIAGNOSIS — E1159 Type 2 diabetes mellitus with other circulatory complications: Secondary | ICD-10-CM

## 2020-10-31 DIAGNOSIS — R809 Proteinuria, unspecified: Secondary | ICD-10-CM

## 2020-10-31 DIAGNOSIS — Z5181 Encounter for therapeutic drug level monitoring: Secondary | ICD-10-CM | POA: Diagnosis not present

## 2020-10-31 DIAGNOSIS — M2550 Pain in unspecified joint: Secondary | ICD-10-CM | POA: Diagnosis not present

## 2020-10-31 DIAGNOSIS — E1165 Type 2 diabetes mellitus with hyperglycemia: Secondary | ICD-10-CM | POA: Diagnosis not present

## 2020-10-31 DIAGNOSIS — F4010 Social phobia, unspecified: Secondary | ICD-10-CM

## 2020-10-31 DIAGNOSIS — E1129 Type 2 diabetes mellitus with other diabetic kidney complication: Secondary | ICD-10-CM | POA: Diagnosis not present

## 2020-10-31 DIAGNOSIS — I152 Hypertension secondary to endocrine disorders: Secondary | ICD-10-CM

## 2020-10-31 DIAGNOSIS — IMO0002 Reserved for concepts with insufficient information to code with codable children: Secondary | ICD-10-CM

## 2020-10-31 LAB — POCT GLYCOSYLATED HEMOGLOBIN (HGB A1C): Hemoglobin A1C: 9.3 % — AB (ref 4.0–5.6)

## 2020-10-31 MED ORDER — AMLODIPINE BESYLATE 10 MG PO TABS: 10.0000 mg | ORAL_TABLET | Freq: Every day | ORAL | 3 refills | Status: DC

## 2020-10-31 MED ORDER — DULOXETINE HCL 30 MG PO CPEP: 30.0000 mg | ORAL_CAPSULE | Freq: Every day | ORAL | 3 refills | Status: DC

## 2020-10-31 MED ORDER — LOSARTAN POTASSIUM 100 MG PO TABS: 1.0000 | ORAL_TABLET | Freq: Every day | ORAL | 1 refills | Status: DC

## 2020-10-31 NOTE — Assessment & Plan Note (Signed)
BP elevated.  Will increase amlodipine to 10mg  daily.  Recommend low sodium dit in addition to dietary change.

## 2020-10-31 NOTE — Assessment & Plan Note (Addendum)
Orders Placed This Encounter  Procedures  . Sed Rate (ESR)  . C-reactive protein  . CK (Creatine Kinase)  . B12  . POCT HgB A1C   Labs per orders.  Possibly arthralgia related to poorly controlled diabetes.

## 2020-10-31 NOTE — Progress Notes (Signed)
[facility] Record - 50 y.o. male MRN [id number]  Date of birth: [birth year]  Subjective Chief Complaint  Patient presents with  . Diabetes    HPI Albert [name] is a 50 y.o. male here today for follow up of T2DM and anxiety.    Diabetes is currently managed with levemir.  He had been trying a new diet and cut back on his insulin.  A1c elevated today.  He has had blood sugar readings at home around 190-210.  He is starting a different low carb diet this week.   He also reports increased anxiety.  He is having increased panic attacks.  He doesn't feel that clonazepam is working as well for him. He has tried several ssri medications in the past which he feels hasn't worked well for him.    He also has had some increased joint pain.  Pain in bilateral shoulders, arms and knees.  Denies neck pain, numbness or tingling. Denies swelling.   ROS:  A comprehensive ROS was completed and negative except as noted per HPI  Allergies  Allergen Reactions  . Metformin And Related Nausea Only    Past Medical History:  Diagnosis Date  . ADHD   . Anxiety   . Depression   . Diabetes mellitus without complication ([facility])   . Dyslipidemia associated with type 2 diabetes mellitus ([place]) 01/21/2017  . Erectile dysfunction 08/04/2016  . Insomnia 06/29/2016  . Low libido 08/04/2016  . Microalbuminuria   . Shoulder dislocation 2008   Left shoulder  . Social anxiety disorder   . Tobacco use disorder     Past Surgical History:  Procedure Laterality Date  . NO PAST SURGERIES      Social History   Socioeconomic History  . Marital status: Significant Other    Spouse name: Not on file  . Number of children: Not on file  . Years of education: Not on file  . Highest education level: Not on file  Occupational History  . Not on file  Tobacco Use  . Smoking status: Former Smoker    Packs/day: 1.00    Years: 25.00    Pack years: 25.00    Types: Cigarettes    Quit date: 04/23/2017    Years since quitting:  3.5  . Smokeless tobacco: Never Used  Vaping Use  . Vaping Use: Every day  . Substances: Nicotine  Substance and Sexual Activity  . Alcohol use: No    Comment: Drank 6-10 beers. Last drank during Christmas  . Drug use: No  . Sexual activity: Yes    Partners: Female  Other Topics Concern  . Not on file  Social History Narrative  . Not on file   Social Determinants of Health   Financial Resource Strain: Not on file  Food Insecurity: Not on file  Transportation Needs: Not on file  Physical Activity: Not on file  Stress: Not on file  Social Connections: Not on file    Family History  Problem Relation Age of Onset  . Hypertension Mother   . Diabetes type II Mother   . Hypertension Sister   . Hypertension Maternal Grandmother   . Diabetes type II Maternal Grandmother   . Breast cancer Paternal Grandfather   . Breast cancer Paternal Grandmother     Health Maintenance  Topic Date Due  . Hepatitis C Screening  Never done  . HIV Screening  Never done  . COLONOSCOPY (Pts 45-49yr Insurance coverage will need to be confirmed)  Never done  .  OPHTHALMOLOGY EXAM  11/11/2019  . COVID-19 Vaccine (3 - Booster for Moderna series) 06/02/2020  . HEMOGLOBIN A1C  05/02/2021  . FOOT EXAM  10/31/2021  . TETANUS/TDAP  12/05/2028  . PNEUMOCOCCAL POLYSACCHARIDE VACCINE AGE 43-64 HIGH RISK  Completed  . HPV VACCINES  Aged Out  . INFLUENZA VACCINE  Discontinued     ----------------------------------------------------------------------------------------------------------------------------------------------------------------------------------------------------------------- Physical Exam BP (!) 149/87 (BP Location: Left Arm, Patient Position: Sitting, Cuff Size: Large)   Pulse (!) 109   Wt 278 lb 6.4 oz (126.3 kg)   SpO2 96%   BMI 42.33 kg/m   Physical Exam Constitutional:      Appearance: Normal appearance.  Eyes:     General: No scleral icterus. Cardiovascular:     Rate and  Rhythm: Normal rate and regular rhythm.  Musculoskeletal:        General: Tenderness present. No swelling or deformity.     Cervical back: Neck supple.  Skin:    General: Skin is warm and dry.  Neurological:     General: No focal deficit present.     Mental Status: He is alert.  Psychiatric:        Mood and Affect: Mood normal.        Behavior: Behavior normal.     ------------------------------------------------------------------------------------------------------------------------------------------------------------------------------------------------------------------- Assessment and Plan  Hypertension associated with diabetes (HCC) BP elevated.  Will increase amlodipine to 55m daily.  Recommend low sodium dit in addition to dietary change.   Type 2 diabetes mellitus with microalbuminuria, with long-term current use of insulin (HCC) He is changing to a low carb diet.  Increased levemir to 45 units daily.  Continue blood glucose monitoring at home.    Social anxiety disorder He is having some joint pain, discussed trialing cymbalta again to help with both anxiety and joint pain.  He will continue clonazepam as needed.  If needing escalating dose of clonazepam will need to see psychiatry.    Arthralgia Orders Placed This Encounter  Procedures  . Sed Rate (ESR)  . C-reactive protein  . CK (Creatine Kinase)  . B12  . POCT HgB A1C   Labs per orders.  Possibly arthralgia related to poorly controlled diabetes.     Meds ordered this encounter  Medications  . amLODipine (NORVASC) 10 MG tablet    Sig: Take 1 tablet (10 mg total) by mouth daily.    Dispense:  90 tablet    Refill:  3  . losartan (COZAAR) 100 MG tablet    Sig: Take 1 tablet (100 mg total) by mouth daily.    Dispense:  90 tablet    Refill:  1  . DULoxetine (CYMBALTA) 30 MG capsule    Sig: Take 1 capsule (30 mg total) by mouth daily.    Dispense:  30 capsule    Refill:  3    Return in about 6 weeks  (around 12/12/2020) for HTN/Anxiety.    This visit occurred during the SARS-CoV-2 public health emergency.  Safety protocols were in place, including screening questions prior to the visit, additional usage of staff PPE, and extensive cleaning of exam room while observing appropriate contact time as indicated for disinfecting solutions.

## 2020-10-31 NOTE — Assessment & Plan Note (Signed)
He is changing to a low carb diet.  Increased levemir to 45 units daily.  Continue blood glucose monitoring at home.

## 2020-10-31 NOTE — Assessment & Plan Note (Signed)
He is having some joint pain, discussed trialing cymbalta again to help with both anxiety and joint pain.  He will continue clonazepam as needed.  If needing escalating dose of clonazepam will need to see psychiatry.

## 2020-10-31 NOTE — Patient Instructions (Signed)
Increase amlodipine to 10mg  daily.  Increase levemir to 45 units daily.  Lets try cymbalta at 30mg  daily.  Have labs completed.  Follow up with me in 6 weeks.

## 2020-11-01 LAB — VITAMIN B12: Vitamin B-12: 326 pg/mL (ref 200–1100)

## 2020-11-01 LAB — C-REACTIVE PROTEIN: CRP: 7 mg/L (ref <8.0)

## 2020-11-01 LAB — CK: Total CK: 96 U/L (ref 44–196)

## 2020-11-01 LAB — SEDIMENTATION RATE: Sed Rate: 9 mm/h (ref 0–15)

## 2020-11-21 ENCOUNTER — Encounter: Payer: Self-pay | Admitting: Family Medicine

## 2020-11-21 NOTE — Telephone Encounter (Signed)
Patient scheduled.

## 2020-11-22 ENCOUNTER — Ambulatory Visit: Payer: No Typology Code available for payment source | Admitting: Family Medicine

## 2020-11-22 ENCOUNTER — Other Ambulatory Visit: Payer: Self-pay

## 2020-11-22 ENCOUNTER — Encounter: Payer: Self-pay | Admitting: Family Medicine

## 2020-11-22 DIAGNOSIS — I152 Hypertension secondary to endocrine disorders: Secondary | ICD-10-CM | POA: Diagnosis not present

## 2020-11-22 DIAGNOSIS — E1159 Type 2 diabetes mellitus with other circulatory complications: Secondary | ICD-10-CM | POA: Diagnosis not present

## 2020-11-22 DIAGNOSIS — F4010 Social phobia, unspecified: Secondary | ICD-10-CM | POA: Diagnosis not present

## 2020-11-22 MED ORDER — DULOXETINE HCL 60 MG PO CPEP: 60.0000 mg | ORAL_CAPSULE | Freq: Every day | ORAL | 1 refills | Status: DC

## 2020-11-22 MED ORDER — VALSARTAN-HYDROCHLOROTHIAZIDE 160-25 MG PO TABS: 1.0000 | ORAL_TABLET | Freq: Every day | ORAL | 3 refills | Status: DC

## 2020-11-22 NOTE — Progress Notes (Signed)
Albert [name] - 50 y.o. male MRN [id number]  Date of birth: 02/23/1971  Subjective No chief complaint on file.   HPI Albert [name] is a 50 y.o. male here today to discuss elevated blood pressures.    BP has been elevated over the past few days.  Currently treated with losartan 100mg  and amlodipine 10mg  daily.  Readings at home have been elevated.  Admits to increased stress at work.  His position at work has changed requiring him to be on the floor more often and interact with customers with causes increased anxiety and panic.  He doesn't feel like current dose of cymbalta is working very well.  This was helpful initially but now not as much.  He continues to take clonazepam as needed.   ROS:  A comprehensive ROS was completed and negative except as noted per HPI  Allergies  Allergen Reactions  . Metformin And Related Nausea Only    Past Medical History:  Diagnosis Date  . ADHD   . Anxiety   . Depression   . Diabetes mellitus without complication (HCC)   . Dyslipidemia associated with type 2 diabetes mellitus (HCC) 01/21/2017  . Erectile dysfunction 08/04/2016  . Insomnia 06/29/2016  . Low libido 08/04/2016  . Microalbuminuria   . Shoulder dislocation 2008   Left shoulder  . Social anxiety disorder   . Tobacco use disorder     Past Surgical History:  Procedure Laterality Date  . NO PAST SURGERIES      Social History   Socioeconomic History  . Marital status: Significant Other    Spouse name: Not on file  . Number of children: Not on file  . Years of education: Not on file  . Highest education level: Not on file  Occupational History  . Not on file  Tobacco Use  . Smoking status: Former Smoker    Packs/day: 1.00    Years: 25.00    Pack years: 25.00    Types: Cigarettes    Quit date: 04/23/2017    Years since quitting: 3.5  . Smokeless tobacco: Never Used  Vaping Use  . Vaping Use: Every day  . Substances: Nicotine  Substance and Sexual Activity  . Alcohol use:  No    Comment: Drank 6-10 beers. Last drank during Christmas  . Drug use: No  . Sexual activity: Yes    Partners: Female  Other Topics Concern  . Not on file  Social History Narrative  . Not on file   Social Determinants of Health   Financial Resource Strain: Not on file  Food Insecurity: Not on file  Transportation Needs: Not on file  Physical Activity: Not on file  Stress: Not on file  Social Connections: Not on file    Family History  Problem Relation Age of Onset  . Hypertension Mother   . Diabetes type II Mother   . Hypertension Sister   . Hypertension Maternal Grandmother   . Diabetes type II Maternal Grandmother   . Breast cancer Paternal Grandfather   . Breast cancer Paternal Grandmother     Health Maintenance  Topic Date Due  . HIV Screening  Never done  . Hepatitis C Screening  Never done  . COLONOSCOPY (Pts 45-21yrs Insurance coverage will need to be confirmed)  Never done  . OPHTHALMOLOGY EXAM  11/11/2019  . COVID-19 Vaccine (3 - Booster for Moderna series) 05/02/2020  . HEMOGLOBIN A1C  05/02/2021  . FOOT EXAM  10/31/2021  . TETANUS/TDAP  12/05/2028  .  PNEUMOCOCCAL POLYSACCHARIDE VACCINE AGE 49-64 HIGH RISK  Completed  . HPV VACCINES  Aged Out  . INFLUENZA VACCINE  Discontinued     ----------------------------------------------------------------------------------------------------------------------------------------------------------------------------------------------------------------- Physical Exam BP (!) 153/89 (BP Location: Left Arm, Patient Position: Sitting, Cuff Size: Large)   Pulse (!) 114   Temp 97.8 F (36.6 C)   Ht 5' 8.5" (1.74 m)   Wt 274 lb 11.2 oz (124.6 kg)   SpO2 96%   BMI 41.16 kg/m   Physical Exam Constitutional:      Appearance: Normal appearance.  Cardiovascular:     Rate and Rhythm: Normal rate and regular rhythm.  Pulmonary:     Effort: Pulmonary effort is normal.     Breath sounds: Normal breath sounds.   Neurological:     General: No focal deficit present.     Mental Status: He is alert.  Psychiatric:        Mood and Affect: Mood normal.        Behavior: Behavior normal.     ------------------------------------------------------------------------------------------------------------------------------------------------------------------------------------------------------------------- Assessment and Plan  Social anxiety disorder New position at work is increasing his anxiety.  Will see if increase in cymbalta is helpful.  Recommend seeing therapist.  Continue clonazepam as needed for now.   Return in about 4 weeks (around 12/20/2020) for HTN/Anxiety.   Hypertension associated with diabetes (HCC) BP elevated today.  D/c losartan.  Adding valsartan/hctz Continue amlodipine.  Low sodium diet recommended.     No orders of the defined types were placed in this encounter.   No follow-ups on file.    This visit occurred during the SARS-CoV-2 public health emergency.  Safety protocols were in place, including screening questions prior to the visit, additional usage of staff PPE, and extensive cleaning of exam room while observing appropriate contact time as indicated for disinfecting solutions.

## 2020-11-22 NOTE — Patient Instructions (Signed)
Stop losartan Start valsartan/hctz.  Continue amlodipine.  Work on sodium reduction.  See me again in 1 month.

## 2020-11-24 NOTE — Assessment & Plan Note (Signed)
BP elevated today.  D/c losartan.  Adding valsartan/hctz Continue amlodipine.  Low sodium diet recommended.

## 2020-11-24 NOTE — Assessment & Plan Note (Signed)
New position at work is increasing his anxiety.  Will see if increase in cymbalta is helpful.  Recommend seeing therapist.  Continue clonazepam as needed for now.   Return in about 4 weeks (around 12/20/2020) for HTN/Anxiety.

## 2020-11-26 ENCOUNTER — Encounter: Payer: Self-pay | Admitting: Family Medicine

## 2020-11-29 ENCOUNTER — Encounter: Payer: Self-pay | Admitting: Family Medicine

## 2020-12-06 ENCOUNTER — Encounter: Payer: Self-pay | Admitting: Family Medicine

## 2020-12-10 ENCOUNTER — Other Ambulatory Visit: Payer: Self-pay | Admitting: Family Medicine

## 2020-12-10 DIAGNOSIS — F4010 Social phobia, unspecified: Secondary | ICD-10-CM

## 2020-12-10 DIAGNOSIS — F4001 Agoraphobia with panic disorder: Secondary | ICD-10-CM

## 2020-12-12 ENCOUNTER — Ambulatory Visit: Payer: No Typology Code available for payment source | Admitting: Family Medicine

## 2020-12-23 ENCOUNTER — Telehealth: Payer: Self-pay

## 2020-12-23 ENCOUNTER — Other Ambulatory Visit: Payer: Self-pay | Admitting: Family Medicine

## 2020-12-23 DIAGNOSIS — E1169 Type 2 diabetes mellitus with other specified complication: Secondary | ICD-10-CM

## 2020-12-23 DIAGNOSIS — E785 Hyperlipidemia, unspecified: Secondary | ICD-10-CM

## 2020-12-23 NOTE — Telephone Encounter (Signed)
Left voicemail for patient to call back to get appointment rescheduled. AM

## 2020-12-23 NOTE — Telephone Encounter (Signed)
Please call patient to reschedule his canceled 6/2 appt for HTN and Anxiety.   Sending 30 day refill of meds to hold until appt.   Thanks

## 2021-01-06 ENCOUNTER — Ambulatory Visit: Payer: No Typology Code available for payment source | Admitting: Family Medicine

## 2021-01-06 ENCOUNTER — Encounter: Payer: Self-pay | Admitting: Family Medicine

## 2021-01-06 ENCOUNTER — Other Ambulatory Visit: Payer: Self-pay

## 2021-01-06 ENCOUNTER — Other Ambulatory Visit: Payer: Self-pay | Admitting: Family Medicine

## 2021-01-06 DIAGNOSIS — F4001 Agoraphobia with panic disorder: Secondary | ICD-10-CM | POA: Diagnosis not present

## 2021-01-06 DIAGNOSIS — E1159 Type 2 diabetes mellitus with other circulatory complications: Secondary | ICD-10-CM

## 2021-01-06 DIAGNOSIS — I152 Hypertension secondary to endocrine disorders: Secondary | ICD-10-CM

## 2021-01-06 MED ORDER — VORTIOXETINE HBR 10 MG PO TABS
10.0000 mg | ORAL_TABLET | Freq: Every day | ORAL | 3 refills | Status: DC
Start: 2021-01-06 — End: 2021-03-18

## 2021-01-06 MED ORDER — ATENOLOL 25 MG PO TABS: 25.0000 mg | ORAL_TABLET | Freq: Every day | ORAL | 3 refills | Status: DC

## 2021-01-06 NOTE — Patient Instructions (Addendum)
Let's see if we can get trintellix approved.  Add atenolol in the afternoon if needed.  See me again in 6-8 weeks.  We'll update A1c at this visit.

## 2021-01-06 NOTE — Progress Notes (Signed)
Albert [name] - 50 y.o. male MRN [id number]  Date of birth: 1970-09-25  Subjective Chief Complaint  Patient presents with   Hypertension    HPI Albert [name] is a 50 y.o. male here today for follow up visit.    He reports that BP has been pretty well controlled throughout the day however readings in the late afternoon and evening have been elevated.  He has also had elevated heart rate.  He denies symptoms related to this including chest pain, shortness of breath,palpitations, headache or vision changes.  Continues to have issues with anxiety and panic.  Cymbalta added at last visit however he discontinued due to erectile issues.  He has tried effexor, sertraline and lexapro previously. He continues on clonazepam daily for anxiety and panic.    ROS:  A comprehensive ROS was completed and negative except as noted per HPI  Allergies  Allergen Reactions   Metformin And Related Nausea Only    Past Medical History:  Diagnosis Date   ADHD    Anxiety    Depression    Diabetes mellitus without complication (HCC)    Dyslipidemia associated with type 2 diabetes mellitus (HCC) 01/21/2017   Erectile dysfunction 08/04/2016   Insomnia 06/29/2016   Low libido 08/04/2016   Microalbuminuria    Shoulder dislocation 2008   Left shoulder   Social anxiety disorder    Tobacco use disorder     Past Surgical History:  Procedure Laterality Date   NO PAST SURGERIES      Social History   Socioeconomic History   Marital status: Significant Other    Spouse name: Not on file   Number of children: Not on file   Years of education: Not on file   Highest education level: Not on file  Occupational History   Not on file  Tobacco Use   Smoking status: Former    Packs/day: 1.00    Years: 25.00    Pack years: 25.00    Types: Cigarettes    Quit date: 04/23/2017    Years since quitting: 3.7   Smokeless tobacco: Never  Vaping Use   Vaping Use: Every day   Substances: Nicotine  Substance and  Sexual Activity   Alcohol use: No    Comment: Drank 6-10 beers. Last drank during Christmas   Drug use: No   Sexual activity: Yes    Partners: Female  Other Topics Concern   Not on file  Social History Narrative   Not on file   Social Determinants of Health   Financial Resource Strain: Not on file  Food Insecurity: Not on file  Transportation Needs: Not on file  Physical Activity: Not on file  Stress: Not on file  Social Connections: Not on file    Family History  Problem Relation Age of Onset   Hypertension Mother    Diabetes type II Mother    Hypertension Sister    Hypertension Maternal Grandmother    Diabetes type II Maternal Grandmother    Breast cancer Paternal Grandfather    Breast cancer Paternal Grandmother     Health Maintenance  Topic Date Due   Pneumococcal Vaccine 12-72 Years old (1 - PCV) Never done   HIV Screening  Never done   Hepatitis C Screening  Never done   COLONOSCOPY (Pts 45-85yrs Insurance coverage will need to be confirmed)  Never done   COVID-19 Vaccine (3 - Moderna risk series) 12/29/2019   HEMOGLOBIN A1C  05/02/2021   OPHTHALMOLOGY EXAM  10/11/2021  FOOT EXAM  10/31/2021   TETANUS/TDAP  12/05/2028   PNEUMOCOCCAL POLYSACCHARIDE VACCINE AGE 18-64 HIGH RISK  Completed   HPV VACCINES  Aged Out   INFLUENZA VACCINE  Discontinued     ----------------------------------------------------------------------------------------------------------------------------------------------------------------------------------------------------------------- Physical Exam BP 130/87 (BP Location: Left Arm, Patient Position: Sitting, Cuff Size: Large)   Pulse (!) 101   Temp 97.7 F (36.5 C)   Wt 264 lb (119.7 kg)   SpO2 94%   BMI 39.56 kg/m   Physical Exam Constitutional:      Appearance: Normal appearance.  HENT:     Head: Normocephalic and atraumatic.  Cardiovascular:     Rate and Rhythm: Normal rate and regular rhythm.  Pulmonary:     Effort:  Pulmonary effort is normal.     Breath sounds: Normal breath sounds.  Musculoskeletal:     Cervical back: Neck supple.  Neurological:     General: No focal deficit present.     Mental Status: He is alert.  Psychiatric:        Mood and Affect: Mood normal.        Behavior: Behavior normal.    ------------------------------------------------------------------------------------------------------------------------------------------------------------------------------------------------------------------- Assessment and Plan  Hypertension associated with diabetes (HCC) BP is better controlled at visit today however readings in the afternoon have been high with elevated heart rate.  Adding atenolol 25mg  daily as needed. Low salt diet recommended as well.    Panic disorder with agoraphobia Discussed trying trintellix if able to get approved through insurance.  Continue clonazepam as needed.    Meds ordered this encounter  Medications   vortioxetine HBr (TRINTELLIX) 10 MG TABS tablet    Sig: Take 1 tablet (10 mg total) by mouth daily.    Dispense:  30 tablet    Refill:  3   atenolol (TENORMIN) 25 MG tablet    Sig: Take 1 tablet (25 mg total) by mouth daily.    Dispense:  90 tablet    Refill:  3    Return in about 8 weeks (around 03/03/2021) for HTN/T2DM.    This visit occurred during the SARS-CoV-2 public health emergency.  Safety protocols were in place, including screening questions prior to the visit, additional usage of staff PPE, and extensive cleaning of exam room while observing appropriate contact time as indicated for disinfecting solutions.

## 2021-01-06 NOTE — Telephone Encounter (Signed)
Please start PA.  Has tried trazodone, sertraline, escitalopram, venlafaxine and duloxetine.

## 2021-01-06 NOTE — Assessment & Plan Note (Signed)
BP is better controlled at visit today however readings in the afternoon have been high with elevated heart rate.  Adding atenolol 25mg  daily as needed. Low salt diet recommended as well.

## 2021-01-06 NOTE — Assessment & Plan Note (Signed)
Discussed trying trintellix if able to get approved through insurance.  Continue clonazepam as needed.

## 2021-01-22 ENCOUNTER — Other Ambulatory Visit: Payer: Self-pay | Admitting: Family Medicine

## 2021-01-28 ENCOUNTER — Other Ambulatory Visit: Payer: Self-pay | Admitting: Family Medicine

## 2021-01-28 DIAGNOSIS — F4001 Agoraphobia with panic disorder: Secondary | ICD-10-CM

## 2021-01-28 DIAGNOSIS — F4010 Social phobia, unspecified: Secondary | ICD-10-CM

## 2021-03-05 ENCOUNTER — Ambulatory Visit: Payer: No Typology Code available for payment source | Admitting: Family Medicine

## 2021-03-16 ENCOUNTER — Other Ambulatory Visit: Payer: Self-pay | Admitting: Family Medicine

## 2021-03-16 DIAGNOSIS — F4010 Social phobia, unspecified: Secondary | ICD-10-CM

## 2021-03-16 DIAGNOSIS — F4001 Agoraphobia with panic disorder: Secondary | ICD-10-CM

## 2021-03-18 ENCOUNTER — Other Ambulatory Visit: Payer: Self-pay | Admitting: Family Medicine

## 2021-03-18 ENCOUNTER — Ambulatory Visit: Payer: No Typology Code available for payment source | Admitting: Family Medicine

## 2021-03-18 ENCOUNTER — Other Ambulatory Visit: Payer: Self-pay

## 2021-03-18 ENCOUNTER — Encounter: Payer: Self-pay | Admitting: Family Medicine

## 2021-03-18 DIAGNOSIS — E1165 Type 2 diabetes mellitus with hyperglycemia: Secondary | ICD-10-CM

## 2021-03-18 DIAGNOSIS — F4001 Agoraphobia with panic disorder: Secondary | ICD-10-CM | POA: Diagnosis not present

## 2021-03-18 DIAGNOSIS — E1159 Type 2 diabetes mellitus with other circulatory complications: Secondary | ICD-10-CM

## 2021-03-18 DIAGNOSIS — I152 Hypertension secondary to endocrine disorders: Secondary | ICD-10-CM

## 2021-03-18 DIAGNOSIS — Z794 Long term (current) use of insulin: Secondary | ICD-10-CM

## 2021-03-18 DIAGNOSIS — E1129 Type 2 diabetes mellitus with other diabetic kidney complication: Secondary | ICD-10-CM

## 2021-03-18 DIAGNOSIS — IMO0002 Reserved for concepts with insufficient information to code with codable children: Secondary | ICD-10-CM

## 2021-03-18 DIAGNOSIS — R809 Proteinuria, unspecified: Secondary | ICD-10-CM

## 2021-03-18 MED ORDER — LORAZEPAM 1 MG PO TABS: 1.0000 mg | ORAL_TABLET | Freq: Four times a day (QID) | ORAL | 1 refills | Status: DC | PRN

## 2021-03-18 MED ORDER — TIRZEPATIDE 2.5 MG/0.5ML ~~LOC~~ SOAJ: SUBCUTANEOUS | 0 refills | Status: DC

## 2021-03-18 MED ORDER — BD PEN NEEDLE NANO 2ND GEN 32G X 4 MM MISC: 12 refills | Status: DC

## 2021-03-18 MED ORDER — TIRZEPATIDE 5 MG/0.5ML ~~LOC~~ SOAJ: 5.0000 mg | SUBCUTANEOUS | 0 refills | Status: DC

## 2021-03-18 MED ORDER — VILAZODONE HCL 20 MG PO TABS: ORAL_TABLET | ORAL | 1 refills | Status: DC

## 2021-03-18 NOTE — Patient Instructions (Signed)
Start new medications as discussed.  Follow up in 6 weeks

## 2021-03-21 ENCOUNTER — Encounter: Payer: Self-pay | Admitting: Family Medicine

## 2021-03-21 ENCOUNTER — Other Ambulatory Visit: Payer: Self-pay | Admitting: Family Medicine

## 2021-03-21 MED ORDER — OZEMPIC (0.25 OR 0.5 MG/DOSE) 2 MG/1.5ML ~~LOC~~ SOPN: PEN_INJECTOR | SUBCUTANEOUS | 1 refills | Status: DC

## 2021-03-21 NOTE — Assessment & Plan Note (Signed)
Blood pressure well controlled at this time.  Continue current medications for management of hypertension. °

## 2021-03-21 NOTE — Assessment & Plan Note (Signed)
Continues to have significant anxiety with depression.  Switching from clonazepam to lorazepam to see if this is more effective for him.  We will also add Viibryd daily.

## 2021-03-21 NOTE — Assessment & Plan Note (Signed)
Blood sugars at home remain elevated.  Switching from Trulicity to [facility].  Continue insulin at current dosing.  Continue work on dietary restrictions.

## 2021-03-21 NOTE — Progress Notes (Signed)
Albert [name] - 50 y.o. male MRN [id number]  Date of birth: [birth year]  Subjective Chief Complaint  Patient presents with   Anxiety    HPI Albert [name] is a 50 year old male here today for follow-up visit.  He has been taking Trulicity and Levemir for management of diabetes.  Blood sugars have remained elevated and he continues to have difficulty with weight loss.  He would like to try switching to a different GLP-1 to see if this is more effective for appetite suppression.  He continues to have difficulty with anxiety as well.  He has tried several medications in the past for management of his anxiety.  Currently taking clonazepam as needed but does not feel like this is quite as effective as it has been in the past.  We tried adding Trintellix at last appointment however this was not covered by his insurance.  His blood pressures remain well controlled with atenolol, valsartan/hydrochlorothiazide and amlodipine.  He has not noted any side effects with this.  He denies chest pain, shortness of breath, palpitations, headache or vision changes.  ROS:  A comprehensive ROS was completed and negative except as noted per HPI  Allergies  Allergen Reactions   Metformin And Related Nausea Only    Past Medical History:  Diagnosis Date   ADHD    Anxiety    Depression    Diabetes mellitus without complication (HCC)    Dyslipidemia associated with type 2 diabetes mellitus (HCC) 01/21/2017   Erectile dysfunction 08/04/2016   Insomnia 06/29/2016   Low libido 08/04/2016   Microalbuminuria    Shoulder dislocation 2008   Left shoulder   Social anxiety disorder    Tobacco use disorder     Past Surgical History:  Procedure Laterality Date   NO PAST SURGERIES      Social History   Socioeconomic History   Marital status: Significant Other    Spouse name: Not on file   Number of children: Not on file   Years of education: Not on file   Highest education level: Not on file  Occupational History    Not on file  Tobacco Use   Smoking status: Former    Packs/day: 1.00    Years: 25.00    Pack years: 25.00    Types: Cigarettes    Quit date: 04/23/2017    Years since quitting: 3.9   Smokeless tobacco: Never  Vaping Use   Vaping Use: Every day   Substances: Nicotine  Substance and Sexual Activity   Alcohol use: No    Comment: Drank 6-10 beers. Last drank during Christmas   Drug use: No   Sexual activity: Yes    Partners: Female  Other Topics Concern   Not on file  Social History Narrative   Not on file   Social Determinants of Health   Financial Resource Strain: Not on file  Food Insecurity: Not on file  Transportation Needs: Not on file  Physical Activity: Not on file  Stress: Not on file  Social Connections: Not on file    Family History  Problem Relation Age of Onset   Hypertension Mother    Diabetes type II Mother    Hypertension Sister    Hypertension Maternal Grandmother    Diabetes type II Maternal Grandmother    Breast cancer Paternal Grandfather    Breast cancer Paternal Grandmother     Health Maintenance  Topic Date Due   HIV Screening  Never done   Hepatitis C Screening  Never done  COLONOSCOPY (Pts 45-76yrs Insurance coverage will need to be confirmed)  Never done   COVID-19 Vaccine (3 - Booster for Moderna series) 05/02/2020   HEMOGLOBIN A1C  05/02/2021   OPHTHALMOLOGY EXAM  10/11/2021   FOOT EXAM  10/31/2021   TETANUS/TDAP  12/05/2028   PNEUMOCOCCAL POLYSACCHARIDE VACCINE AGE 61-64 HIGH RISK  Completed   Pneumococcal Vaccine 73-8 Years old  Aged Out   HPV VACCINES  Aged Out   INFLUENZA VACCINE  Discontinued     ----------------------------------------------------------------------------------------------------------------------------------------------------------------------------------------------------------------- Physical Exam BP 126/82 (BP Location: Left Arm, Patient Position: Sitting, Cuff Size: Large)   Pulse 88   Ht 5' 8.5"  (1.74 m)   Wt 266 lb (120.7 kg)   SpO2 95%   BMI 39.86 kg/m   Physical Exam Constitutional:      Appearance: Normal appearance.  HENT:     Head: Normocephalic and atraumatic.  Skin:    General: Skin is warm and dry.  Neurological:     General: No focal deficit present.     Mental Status: He is alert and oriented to person, place, and time.    ------------------------------------------------------------------------------------------------------------------------------------------------------------------------------------------------------------------- Assessment and Plan  Hypertension associated with diabetes (HCC) Blood pressure well controlled at this time.  Continue current medications for management of hypertension.  Type 2 diabetes mellitus with microalbuminuria, with long-term current use of insulin (HCC) Blood sugars at home remain elevated.  Switching from Trulicity to [facility].  Continue insulin at current dosing.  Continue work on dietary restrictions.  Panic disorder with agoraphobia Continues to have significant anxiety with depression.  Switching from clonazepam to lorazepam to see if this is more effective for him.  We will also add Viibryd daily.   Meds ordered this encounter  Medications   Vilazodone HCl (VIIBRYD) 20 MG TABS    Sig: Take 1/2 tab x7 days then increase to 1 tab daily.    Dispense:  30 tablet    Refill:  1   DISCONTD: tirzepatide (MOUNJARO) 2.5 MG/0.5ML Pen    Sig: Increase to 5mg  after 4 weeks    Dispense:  2 mL    Refill:  0   DISCONTD: tirzepatide (MOUNJARO) 5 MG/0.5ML Pen    Sig: Inject 5 mg into the skin once a week.    Dispense:  6 mL    Refill:  0   Insulin Pen Needle (BD PEN NEEDLE NANO 2ND GEN) 32G X 4 MM MISC    Sig: USE SUBCUTANEOUSLY DAILY WITH LEVEMIR    Dispense:  100 each    Refill:  12   LORazepam (ATIVAN) 1 MG tablet    Sig: Take 1 tablet (1 mg total) by mouth every 6 (six) hours as needed for anxiety. Replaces clonazepam     Dispense:  90 tablet    Refill:  1    Return in about 6 weeks (around 04/29/2021) for T2DM/Anxiety.    This visit occurred during the SARS-CoV-2 public health emergency.  Safety protocols were in place, including screening questions prior to the visit, additional usage of staff PPE, and extensive cleaning of exam room while observing appropriate contact time as indicated for disinfecting solutions.

## 2021-03-28 ENCOUNTER — Other Ambulatory Visit: Payer: Self-pay | Admitting: Family Medicine

## 2021-03-28 ENCOUNTER — Encounter: Payer: Self-pay | Admitting: Family Medicine

## 2021-03-28 DIAGNOSIS — F4001 Agoraphobia with panic disorder: Secondary | ICD-10-CM

## 2021-03-28 DIAGNOSIS — F4010 Social phobia, unspecified: Secondary | ICD-10-CM

## 2021-03-28 MED ORDER — CLONAZEPAM 1 MG PO TABS: 1.0000 mg | ORAL_TABLET | Freq: Two times a day (BID) | ORAL | 5 refills | Status: DC | PRN

## 2021-03-28 NOTE — Progress Notes (Signed)
clo

## 2021-04-29 ENCOUNTER — Ambulatory Visit: Payer: No Typology Code available for payment source | Admitting: Family Medicine

## 2021-04-30 NOTE — Telephone Encounter (Signed)
I am not able to find in his chart where trazodone has been prescribed.

## 2021-05-05 ENCOUNTER — Other Ambulatory Visit: Payer: Self-pay | Admitting: Family Medicine

## 2021-05-19 ENCOUNTER — Encounter: Payer: Self-pay | Admitting: Family Medicine

## 2021-06-12 ENCOUNTER — Other Ambulatory Visit: Payer: Self-pay | Admitting: Family Medicine

## 2021-07-18 ENCOUNTER — Ambulatory Visit: Payer: No Typology Code available for payment source | Admitting: Sports Medicine

## 2021-07-21 ENCOUNTER — Other Ambulatory Visit: Payer: Self-pay | Admitting: Family Medicine

## 2021-07-21 DIAGNOSIS — F4010 Social phobia, unspecified: Secondary | ICD-10-CM

## 2021-07-21 DIAGNOSIS — F4001 Agoraphobia with panic disorder: Secondary | ICD-10-CM

## 2021-08-16 ENCOUNTER — Other Ambulatory Visit: Payer: Self-pay | Admitting: Family Medicine

## 2021-08-16 DIAGNOSIS — E785 Hyperlipidemia, unspecified: Secondary | ICD-10-CM

## 2021-08-16 DIAGNOSIS — E1169 Type 2 diabetes mellitus with other specified complication: Secondary | ICD-10-CM

## 2021-08-20 ENCOUNTER — Telehealth: Payer: Self-pay

## 2021-08-20 ENCOUNTER — Other Ambulatory Visit: Payer: Self-pay | Admitting: Family Medicine

## 2021-08-20 NOTE — Telephone Encounter (Signed)
error 

## 2021-09-18 ENCOUNTER — Ambulatory Visit: Payer: No Typology Code available for payment source | Admitting: Sports Medicine

## 2021-09-18 ENCOUNTER — Ambulatory Visit (INDEPENDENT_AMBULATORY_CARE_PROVIDER_SITE_OTHER): Payer: No Typology Code available for payment source

## 2021-09-18 ENCOUNTER — Other Ambulatory Visit: Payer: Self-pay

## 2021-09-18 DIAGNOSIS — M25512 Pain in left shoulder: Secondary | ICD-10-CM

## 2021-09-18 DIAGNOSIS — G8929 Other chronic pain: Secondary | ICD-10-CM | POA: Diagnosis not present

## 2021-09-18 MED ORDER — MELOXICAM 15 MG PO TABS: ORAL_TABLET | ORAL | 3 refills | Status: DC

## 2021-09-18 NOTE — Assessment & Plan Note (Signed)
This is a very pleasant 51 year old male, has had several weeks of pain in his left shoulder localized over the deltoid and worse with abduction, on exam he has positive impingement signs, he does have some labral/glenohumeral signs as well, no weakness. ?Today we injected the subacromial bursa due to severity of pain and nocturnal component. ?Adding x-rays, home physical therapy, meloxicam. ?Return to see me in 4 to 6 weeks, MRI if no better. ?

## 2021-09-18 NOTE — Progress Notes (Signed)
? ? ?  Procedures performed today:   ? ?Procedure: Real-time Ultrasound Guided injection of the right subacromial bursa ?Device: Samsung HS60  ?Verbal informed consent obtained.  ?Time-out conducted.  ?Noted no overlying erythema, induration, or other signs of local infection.  ?Skin prepped in a sterile fashion.  ?Local anesthesia: Topical Ethyl chloride.  ?With sterile technique and under real time ultrasound guidance: Noted intact cuff, 1 cc Kenalog 40, 1 cc lidocaine, 1 cc bupivacaine injected easily ?Completed without difficulty  ?Advised to call if fevers/chills, erythema, induration, drainage, or persistent bleeding.  ?Images permanently stored and available for review in PACS.  ?Impression: Technically successful ultrasound guided injection. ? ?Independent interpretation of notes and tests performed by another provider:  ? ?None. ? ?Brief History, Exam, Impression, and Recommendations:   ? ?Chronic left shoulder pain ?This is a very pleasant 51 year old male, has had several weeks of pain in his left shoulder localized over the deltoid and worse with abduction, on exam he has positive impingement signs, he does have some labral/glenohumeral signs as well, no weakness. ?Today we injected the subacromial bursa due to severity of pain and nocturnal component. ?Adding x-rays, home physical therapy, meloxicam. ?Return to see me in 4 to 6 weeks, MRI if no better. ? ? ? ?___________________________________________ ?Ihor Austin. Benjamin Stain, M.D., ABFM., CAQSM. ?Primary Care and Sports Medicine ?[facility] ? ?Adjunct Instructor of Family Medicine  ?[employer] ?

## 2021-10-11 ENCOUNTER — Other Ambulatory Visit: Payer: Self-pay | Admitting: Family Medicine

## 2021-10-11 DIAGNOSIS — F4001 Agoraphobia with panic disorder: Secondary | ICD-10-CM

## 2021-10-11 DIAGNOSIS — F4010 Social phobia, unspecified: Secondary | ICD-10-CM

## 2021-10-14 ENCOUNTER — Encounter: Payer: Self-pay | Admitting: Family Medicine

## 2021-10-14 ENCOUNTER — Other Ambulatory Visit: Payer: Self-pay

## 2021-10-14 DIAGNOSIS — F4010 Social phobia, unspecified: Secondary | ICD-10-CM

## 2021-10-14 DIAGNOSIS — F4001 Agoraphobia with panic disorder: Secondary | ICD-10-CM

## 2021-10-15 NOTE — Telephone Encounter (Signed)
Patient scheduled 11/06/2021 - appt notes adjusted to reflect continued med refills. ?

## 2021-10-15 NOTE — Telephone Encounter (Signed)
Please contact patient to schedule 68-month follow-up for medication refills. Sending 30 day refills.  ?Thanks ? ?

## 2021-10-22 MED ORDER — CLONAZEPAM 1 MG PO TABS: 1.0000 mg | ORAL_TABLET | Freq: Two times a day (BID) | ORAL | 3 refills | Status: DC | PRN

## 2021-10-31 ENCOUNTER — Ambulatory Visit: Payer: No Typology Code available for payment source | Admitting: Sports Medicine

## 2021-11-06 ENCOUNTER — Other Ambulatory Visit: Payer: Self-pay | Admitting: Family Medicine

## 2021-11-06 ENCOUNTER — Encounter: Payer: Self-pay | Admitting: Family Medicine

## 2021-11-06 ENCOUNTER — Ambulatory Visit: Payer: No Typology Code available for payment source | Admitting: Family Medicine

## 2021-11-06 VITALS — BP 122/90 | HR 105 | Ht 71.26 in | Wt 237.0 lb

## 2021-11-06 DIAGNOSIS — E291 Testicular hypofunction: Secondary | ICD-10-CM

## 2021-11-06 DIAGNOSIS — E1169 Type 2 diabetes mellitus with other specified complication: Secondary | ICD-10-CM | POA: Diagnosis not present

## 2021-11-06 DIAGNOSIS — E1159 Type 2 diabetes mellitus with other circulatory complications: Secondary | ICD-10-CM | POA: Diagnosis not present

## 2021-11-06 DIAGNOSIS — E1129 Type 2 diabetes mellitus with other diabetic kidney complication: Secondary | ICD-10-CM | POA: Diagnosis not present

## 2021-11-06 DIAGNOSIS — R809 Proteinuria, unspecified: Secondary | ICD-10-CM

## 2021-11-06 DIAGNOSIS — Z0000 Encounter for general adult medical examination without abnormal findings: Secondary | ICD-10-CM | POA: Diagnosis not present

## 2021-11-06 DIAGNOSIS — I152 Hypertension secondary to endocrine disorders: Secondary | ICD-10-CM

## 2021-11-06 DIAGNOSIS — E785 Hyperlipidemia, unspecified: Secondary | ICD-10-CM

## 2021-11-06 DIAGNOSIS — Z794 Long term (current) use of insulin: Secondary | ICD-10-CM

## 2021-11-06 DIAGNOSIS — F4001 Agoraphobia with panic disorder: Secondary | ICD-10-CM

## 2021-11-06 MED ORDER — LEVEMIR FLEXTOUCH 100 UNIT/ML ~~LOC~~ SOPN: 44.0000 [IU] | PEN_INJECTOR | Freq: Every evening | SUBCUTANEOUS | 3 refills | Status: DC

## 2021-11-06 MED ORDER — VALSARTAN-HYDROCHLOROTHIAZIDE 160-25 MG PO TABS: 1.0000 | ORAL_TABLET | Freq: Every day | ORAL | 3 refills | Status: DC

## 2021-11-06 MED ORDER — VILAZODONE HCL 40 MG PO TABS: 40.0000 mg | ORAL_TABLET | Freq: Every day | ORAL | 3 refills | Status: DC

## 2021-11-06 MED ORDER — TIRZEPATIDE 2.5 MG/0.5ML ~~LOC~~ SOAJ: 2.5000 mg | SUBCUTANEOUS | 0 refills | Status: DC

## 2021-11-06 MED ORDER — ATORVASTATIN CALCIUM 10 MG PO TABS: 10.0000 mg | ORAL_TABLET | Freq: Every day | ORAL | 2 refills | Status: AC

## 2021-11-06 MED ORDER — AMLODIPINE BESYLATE 10 MG PO TABS: 10.0000 mg | ORAL_TABLET | Freq: Every day | ORAL | 2 refills | Status: DC

## 2021-11-06 MED ORDER — TIRZEPATIDE 5 MG/0.5ML ~~LOC~~ SOAJ: 5.0000 mg | SUBCUTANEOUS | 0 refills | Status: DC

## 2021-11-06 MED ORDER — TIRZEPATIDE 7.5 MG/0.5ML ~~LOC~~ SOAJ: 7.5000 mg | SUBCUTANEOUS | 0 refills | Status: DC

## 2021-11-06 NOTE — Assessment & Plan Note (Signed)
He is currently tolerating atorvastatin well.  Recommend continuation at current strength.  Update lipid panel. ?

## 2021-11-06 NOTE — Assessment & Plan Note (Signed)
Blood pressures fairly well controlled at this time.  Recommend continuation of current medications for management of his hypertension. ?

## 2021-11-06 NOTE — Assessment & Plan Note (Signed)
Reports that anxiety and panic are not well controlled at this time.  He was distant from his job.  Increasing Vyvanse to 40 mg daily.  Continue clonazepam at current dose as needed. ?

## 2021-11-06 NOTE — Patient Instructions (Addendum)
Lets try mounjaro to replace ozempic.  ?Start 2.5mg  weekly for 4 weeks then increase to 5mg  weekly for 4 weeks then increase to 7.5mg  weekly.  ?Increase viibryd to 40mg  daily to help with anxiety.  ?See me again in 3 months.  ? ?

## 2021-11-06 NOTE — Assessment & Plan Note (Signed)
Has had issues finding Ozempic in stock.  Switching to [facility] to replace this.  Continue Levemir at current strength.  Continue to work on diet and exercise changes for improvement in blood glucose as well. ?

## 2021-11-06 NOTE — Addendum Note (Signed)
Addended by: Mammie Lorenzo on: 11/06/2021 09:59 PM ? ? Modules accepted: Level of Service ? ?

## 2021-11-06 NOTE — Progress Notes (Signed)
?Albert [name] - 51 y.o. male MRN [id number]  Date of birth: Nov 17, 1970 ? ?Subjective ?Chief Complaint  ?Patient presents with  ? Annual Exam  ? Hypertension  ? Diabetes  ? ? ?HPI ?[name] is a 51 year old male here today for follow-up visit.  He would also like to have annual exam today. ? ?Diabetes is currently managed with Levemir 44 units daily.  Doing well with this overall.  He was prescribed Ozempic previously however discontinued due to medication shortage issues.  He is not currently checking his blood sugars regularly.  Denies symptoms related to diabetes at this time. ? ?Blood pressure currently treated with combination of amlodipine, atenolol and valsartan/hydrochlorothiazide.  He reports he is taking this as directed.  Blood pressures fairly well controlled at this time.  He denies symptoms related to hypertension including chest pain, shortness of breath, palpitations, headaches or vision changes. ? ?Reports that his anxiety is not well controlled at this time.  Current treatment with Viibryd and clonazepam.  He has tried several medications in the past for management of his anxiety.  He reports that he is taking Viibryd as directed at this time. ? ? ?Additionally he is seeing Dr. Benjamin Stain for management of his right shoulder pain. ? ?ROS:  A comprehensive ROS was completed and negative except as noted per HPI ? ?Allergies  ?Allergen Reactions  ? Metformin And Related Nausea Only  ? ? ?Past Medical History:  ?Diagnosis Date  ? ADHD   ? Anxiety   ? Depression   ? Diabetes mellitus without complication (HCC)   ? Dyslipidemia associated with type 2 diabetes mellitus (HCC) 01/21/2017  ? Erectile dysfunction 08/04/2016  ? Insomnia 06/29/2016  ? Low libido 08/04/2016  ? Microalbuminuria   ? Shoulder dislocation 2008  ? Left shoulder  ? Social anxiety disorder   ? Tobacco use disorder   ? ? ?Past Surgical History:  ?Procedure Laterality Date  ? NO PAST SURGERIES    ? ? ?Social History  ? ?Socioeconomic History   ? Marital status: Significant Other  ?  Spouse name: Not on file  ? Number of children: Not on file  ? Years of education: Not on file  ? Highest education level: Not on file  ?Occupational History  ? Not on file  ?Tobacco Use  ? Smoking status: Former  ?  Packs/day: 1.00  ?  Years: 25.00  ?  Pack years: 25.00  ?  Types: Cigarettes  ?  Quit date: 04/23/2017  ?  Years since quitting: 4.5  ? Smokeless tobacco: Never  ?Vaping Use  ? Vaping Use: Every day  ? Substances: Nicotine  ?Substance and Sexual Activity  ? Alcohol use: No  ?  Comment: Drank 6-10 beers. Last drank during Christmas  ? Drug use: No  ? Sexual activity: Yes  ?  Partners: Female  ?Other Topics Concern  ? Not on file  ?Social History Narrative  ? Not on file  ? ?Social Determinants of Health  ? ?Financial Resource Strain: Not on file  ?Food Insecurity: Not on file  ?Transportation Needs: Not on file  ?Physical Activity: Not on file  ?Stress: Not on file  ?Social Connections: Not on file  ? ? ?Family History  ?Problem Relation Age of Onset  ? Hypertension Mother   ? Diabetes type II Mother   ? Hypertension Sister   ? Hypertension Maternal Grandmother   ? Diabetes type II Maternal Grandmother   ? Breast cancer Paternal Grandfather   ? Breast  cancer Paternal Grandmother   ? ? ?Health Maintenance  ?Topic Date Due  ? HEMOGLOBIN A1C  05/02/2021  ? OPHTHALMOLOGY EXAM  10/11/2021  ? FOOT EXAM  10/31/2021  ? COVID-19 Vaccine (3 - Booster for Moderna series) 11/22/2021 (Originally 01/26/2020)  ? COLONOSCOPY (Pts [phone] Insurance coverage will need to be confirmed)  11/07/2022 (Originally 03/27/2017)  ? Hepatitis C Screening  11/07/2022 (Originally 03/27/1990)  ? HIV Screening  11/07/2022 (Originally 03/28/1987)  ? TETANUS/TDAP  12/05/2028  ? HPV VACCINES  Aged Out  ? INFLUENZA VACCINE  Discontinued   ? ? ? ?----------------------------------------------------------------------------------------------------------------------------------------------------------------------------------------------------------------- ?Physical Exam ?BP 122/90 (BP Location: Right Arm, Patient Position: Sitting, Cuff Size: Normal)   Pulse (!) 105   Ht 5' 11.26" (1.81 m)   Wt 237 lb (107.5 kg)   SpO2 98%   BMI 32.81 kg/m?  ? ?Physical Exam ?Constitutional:   ?   General: He is not in acute distress. ?   Appearance: Normal appearance.  ?HENT:  ?   Head: Normocephalic and atraumatic.  ?   Right Ear: Tympanic membrane and external ear normal.  ?   Left Ear: Tympanic membrane and external ear normal.  ?Eyes:  ?   General: No scleral icterus. ?Neck:  ?   Thyroid: No thyromegaly.  ?Cardiovascular:  ?   Rate and Rhythm: Normal rate and regular rhythm.  ?   Heart sounds: Normal heart sounds.  ?Pulmonary:  ?   Effort: Pulmonary effort is normal.  ?   Breath sounds: Normal breath sounds.  ?Abdominal:  ?   General: Bowel sounds are normal. There is no distension.  ?   Palpations: Abdomen is soft.  ?   Tenderness: There is no abdominal tenderness. There is no guarding.  ?Musculoskeletal:  ?   Cervical back: Normal range of motion and neck supple.  ?Lymphadenopathy:  ?   Cervical: No cervical adenopathy.  ?Skin: ?   General: Skin is warm and dry.  ?   Findings: No rash.  ?Neurological:  ?   Mental Status: He is alert and oriented to person, place, and time.  ?   Cranial Nerves: No cranial nerve deficit.  ?   Motor: No abnormal muscle tone.  ?Psychiatric:     ?   Mood and Affect: Mood normal.     ?   Behavior: Behavior normal.  ? ? ?------------------------------------------------------------------------------------------------------------------------------------------------------------------------------------------------------------------- ?Assessment and Plan ? ?Hypertension associated with diabetes (HCC) ?Blood pressures fairly well  controlled at this time.  Recommend continuation of current medications for management of his hypertension. ? ?Dyslipidemia associated with type 2 diabetes mellitus (HCC) ?He is currently tolerating atorvastatin well.  Recommend continuation at current strength.  Update lipid panel. ? ?Type 2 diabetes mellitus with microalbuminuria, with long-term current use of insulin (HCC) ?Has had issues finding Ozempic in stock.  [facility]  Continue Levemir at current strength.  Continue to work on diet and exercise changes for improvement in blood glucose as well. ? ?Panic disorder with agoraphobia ?Reports that anxiety and panic are not well controlled at this time.  He was distant from his job.  Increasing Vyvanse to 40 mg daily.  Continue clonazepam at current dose as needed. ? ? ?Meds ordered this encounter  ?Medications  ? insulin detemir (LEVEMIR FLEXTOUCH) 100 UNIT/ML FlexPen  ?  Sig: Inject 44 Units into the skin every evening.  ?  Dispense:  3 mL  ?  Refill:  3  ? valsartan-hydrochlorothiazide (DIOVAN-HCT) 160-25 MG tablet  ?  Sig: Take 1 tablet by mouth daily.  ?  Dispense:  90 tablet  ?  Refill:  3  ? atorvastatin (LIPITOR) 10 MG tablet  ?  Sig: Take 1 tablet (10 mg total) by mouth daily.  ?  Dispense:  90 tablet  ?  Refill:  2  ? amLODipine (NORVASC) 10 MG tablet  ?  Sig: Take 1 tablet (10 mg total) by mouth daily.  ?  Dispense:  90 tablet  ?  Refill:  2  ? tirzepatide (MOUNJARO) 2.5 MG/0.5ML Pen  ?  Sig: Inject 2.5 mg into the skin once a week.  ?  Dispense:  2 mL  ?  Refill:  0  ? tirzepatide (MOUNJARO) 5 MG/0.5ML Pen  ?  Sig: Inject 5 mg into the skin once a week.  ?  Dispense:  2 mL  ?  Refill:  0  ? tirzepatide (MOUNJARO) 7.5 MG/0.5ML Pen  ?  Sig: Inject 7.5 mg into the skin once a week.  ?  Dispense:  6 mL  ?  Refill:  0  ? Vilazodone HCl (VIIBRYD) 40 MG TABS  ?  Sig: Take 1 tablet (40 mg total) by mouth daily.  ?  Dispense:  30 tablet  ?  Refill:  3  ? ? ?No follow-ups on  file. ? ? ? ?This visit occurred during the SARS-CoV-2 public health emergency.  Safety protocols were in place, including screening questions prior to the visit, additional usage of staff PPE, and extensive cleaning of exam room while observing appropriate contact time as indicated for disinfecting solutions.  ? ?

## 2021-11-07 ENCOUNTER — Other Ambulatory Visit: Payer: Self-pay | Admitting: Family Medicine

## 2021-11-07 DIAGNOSIS — K760 Fatty (change of) liver, not elsewhere classified: Secondary | ICD-10-CM

## 2021-11-07 DIAGNOSIS — R17 Unspecified jaundice: Secondary | ICD-10-CM

## 2021-11-07 LAB — CBC WITH DIFFERENTIAL/PLATELET
Absolute Monocytes: 491 cells/uL (ref 200–950)
Basophils Absolute: 91 cells/uL (ref 0–200)
Basophils Relative: 1 %
Eosinophils Absolute: 64 cells/uL (ref 15–500)
Eosinophils Relative: 0.7 %
HCT: 47 % (ref 38.5–50.0)
Hemoglobin: 16.5 g/dL (ref 13.2–17.1)
Lymphs Abs: 1047 cells/uL (ref 850–3900)
MCH: 33.8 pg — ABNORMAL HIGH (ref 27.0–33.0)
MCHC: 35.1 g/dL (ref 32.0–36.0)
MCV: 96.3 fL (ref 80.0–100.0)
MPV: 10.2 fL (ref 7.5–12.5)
Monocytes Relative: 5.4 %
Neutro Abs: 7407 cells/uL (ref 1500–7800)
Neutrophils Relative %: 81.4 %
Platelets: 199 10*3/uL (ref 140–400)
RBC: 4.88 10*6/uL (ref 4.20–5.80)
RDW: 12.6 % (ref 11.0–15.0)
Total Lymphocyte: 11.5 %
WBC: 9.1 10*3/uL (ref 3.8–10.8)

## 2021-11-07 LAB — COMPLETE METABOLIC PANEL WITH GFR
AG Ratio: 1.6 (calc) (ref 1.0–2.5)
ALT: 77 U/L — ABNORMAL HIGH (ref 9–46)
AST: 62 U/L — ABNORMAL HIGH (ref 10–40)
Albumin: 4.6 g/dL (ref 3.6–5.1)
Alkaline phosphatase (APISO): 94 U/L (ref 36–130)
BUN: 12 mg/dL (ref 7–25)
CO2: 25 mmol/L (ref 20–32)
Calcium: 9.4 mg/dL (ref 8.6–10.3)
Chloride: 94 mmol/L — ABNORMAL LOW (ref 98–110)
Creat: 1.04 mg/dL (ref 0.60–1.29)
Globulin: 2.9 g/dL (calc) (ref 1.9–3.7)
Glucose, Bld: 269 mg/dL — ABNORMAL HIGH (ref 65–99)
Potassium: 3.8 mmol/L (ref 3.5–5.3)
Sodium: 134 mmol/L — ABNORMAL LOW (ref 135–146)
Total Bilirubin: 1.6 mg/dL — ABNORMAL HIGH (ref 0.2–1.2)
Total Protein: 7.5 g/dL (ref 6.1–8.1)
eGFR: 88 mL/min/{1.73_m2} (ref >=60)

## 2021-11-07 LAB — LIPID PANEL W/REFLEX DIRECT LDL
Cholesterol: 142 mg/dL (ref <200)
HDL: 53 mg/dL (ref >=40)
LDL Cholesterol (Calc): 69 mg/dL
Non-HDL Cholesterol (Calc): 89 mg/dL (ref <130)
Total CHOL/HDL Ratio: 2.7 (calc) (ref <5.0)
Triglycerides: 122 mg/dL (ref <150)

## 2021-11-07 LAB — HEMOGLOBIN A1C
Hgb A1c MFr Bld: 10.2 % of total Hgb — ABNORMAL HIGH (ref <5.7)
Mean Plasma Glucose: 246 mg/dL
eAG (mmol/L): 13.6 mmol/L

## 2021-11-07 LAB — TESTOSTERONE: Testosterone: 284 ng/dL (ref 250–827)

## 2021-11-11 ENCOUNTER — Ambulatory Visit (INDEPENDENT_AMBULATORY_CARE_PROVIDER_SITE_OTHER): Payer: No Typology Code available for payment source

## 2021-11-11 ENCOUNTER — Telehealth: Payer: Self-pay | Admitting: Family Medicine

## 2021-11-11 ENCOUNTER — Ambulatory Visit: Payer: No Typology Code available for payment source | Admitting: Sports Medicine

## 2021-11-11 DIAGNOSIS — M25511 Pain in right shoulder: Secondary | ICD-10-CM | POA: Diagnosis not present

## 2021-11-11 DIAGNOSIS — G8929 Other chronic pain: Secondary | ICD-10-CM

## 2021-11-11 DIAGNOSIS — M25512 Pain in left shoulder: Secondary | ICD-10-CM

## 2021-11-11 NOTE — Assessment & Plan Note (Signed)
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 ?

## 2021-11-11 NOTE — Telephone Encounter (Signed)
Patient dropped off FMLA paperwork for PCP to complete. Patient was informed of a possible fee and a 3-5 day turn around - paperwork placed in providers box - lmr. ?

## 2021-11-11 NOTE — Progress Notes (Signed)
? ? ?  Procedures performed today:   ? ?Procedure: Real-time Ultrasound Guided injection of the right glenohumeral joint ?Device: Samsung HS60  ?Verbal informed consent obtained.  ?Time-out conducted.  ?Noted no overlying erythema, induration, or other signs of local infection.  ?Skin prepped in a sterile fashion.  ?Local anesthesia: Topical Ethyl chloride.  ?With sterile technique and under real time ultrasound guidance: Noted arthritic joint, 1 cc Kenalog 40, 2 cc lidocaine, 2 cc bupivacaine injected easily ?Completed without difficulty  ?Advised to call if fevers/chills, erythema, induration, drainage, or persistent bleeding.  ?Images permanently stored and available for review in PACS.  ?Impression: Technically successful ultrasound guided injection. ? ?Independent interpretation of notes and tests performed by another provider:  ? ?None. ? ?Brief History, Exam, Impression, and Recommendations:   ? 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 ? ? ? ?___________________________________________ ?Ihor Austin. Benjamin Stain, M.D., ABFM., CAQSM. ?Primary Care and Sports Medicine ?[facility] ? ?Adjunct Instructor of Family Medicine  ?[employer] ?

## 2021-11-13 ENCOUNTER — Encounter: Payer: Self-pay | Admitting: Family Medicine

## 2021-11-13 MED ORDER — SEMAGLUTIDE (1 MG/DOSE) 4 MG/3ML ~~LOC~~ SOPN
1.0000 mg | PEN_INJECTOR | SUBCUTANEOUS | 1 refills | Status: DC
Start: 2021-11-13 — End: 2022-03-05

## 2021-11-13 MED ORDER — OZEMPIC (0.25 OR 0.5 MG/DOSE) 2 MG/3ML ~~LOC~~ SOPN: PEN_INJECTOR | SUBCUTANEOUS | 0 refills | Status: DC

## 2021-11-13 NOTE — Telephone Encounter (Signed)
PA

## 2021-11-17 NOTE — Telephone Encounter (Signed)
Completed documents given to Front Desk to contact patient for payment and pick-up. Please scan to chart for FMLA. Thanks ?

## 2021-11-17 NOTE — Telephone Encounter (Signed)
Patient notified via v.mail. ?

## 2021-11-19 ENCOUNTER — Telehealth: Payer: Self-pay

## 2021-11-19 NOTE — Telephone Encounter (Addendum)
Initiated Prior authorization [id number] 18MG /3ML pen-injectors ?Via: Covermymeds ?Case/Key:[id number] ?Status: approved  as of 11/19/21 ?Reason:, this request is approved from 11/19/2021 to 11/18/2024 ?Notified Pt via: Mychart ? ?Please note: Although pt has received an approval for this medication treatment will continue to remain the same, pt will continue on Ozempic as it working well for pt , per Dr.matthews.  A separate communication was sent to the pt explaining this. Pt communicated and expressed understanding. ?

## 2021-12-03 ENCOUNTER — Other Ambulatory Visit: Payer: Self-pay | Admitting: Family Medicine

## 2021-12-09 ENCOUNTER — Ambulatory Visit: Payer: No Typology Code available for payment source | Admitting: Sports Medicine

## 2021-12-11 ENCOUNTER — Other Ambulatory Visit: Payer: Self-pay | Admitting: Family Medicine

## 2021-12-11 DIAGNOSIS — F4010 Social phobia, unspecified: Secondary | ICD-10-CM

## 2021-12-11 DIAGNOSIS — F4001 Agoraphobia with panic disorder: Secondary | ICD-10-CM

## 2021-12-11 MED ORDER — CLONAZEPAM 2 MG PO TABS: 2.0000 mg | ORAL_TABLET | Freq: Two times a day (BID) | ORAL | 1 refills | Status: DC | PRN

## 2021-12-16 ENCOUNTER — Encounter (INDEPENDENT_AMBULATORY_CARE_PROVIDER_SITE_OTHER): Payer: No Typology Code available for payment source | Admitting: Sports Medicine

## 2021-12-16 DIAGNOSIS — M25511 Pain in right shoulder: Secondary | ICD-10-CM

## 2021-12-16 DIAGNOSIS — G8929 Other chronic pain: Secondary | ICD-10-CM | POA: Diagnosis not present

## 2021-12-16 NOTE — Telephone Encounter (Signed)
I spent 5 total minutes of online digital evaluation and management services in this patient-initiated request for online care. 

## 2022-01-11 ENCOUNTER — Other Ambulatory Visit: Payer: Self-pay | Admitting: Family Medicine

## 2022-01-11 DIAGNOSIS — F4001 Agoraphobia with panic disorder: Secondary | ICD-10-CM

## 2022-01-11 DIAGNOSIS — F4010 Social phobia, unspecified: Secondary | ICD-10-CM

## 2022-01-30 ENCOUNTER — Ambulatory Visit (INDEPENDENT_AMBULATORY_CARE_PROVIDER_SITE_OTHER): Payer: No Typology Code available for payment source | Admitting: Orthopaedic Surgery

## 2022-01-30 DIAGNOSIS — G8929 Other chronic pain: Secondary | ICD-10-CM | POA: Diagnosis not present

## 2022-01-30 DIAGNOSIS — M25511 Pain in right shoulder: Secondary | ICD-10-CM | POA: Diagnosis not present

## 2022-01-30 MED ORDER — TRAMADOL HCL 50 MG PO TABS: 50.0000 mg | ORAL_TABLET | Freq: Every day | ORAL | 0 refills | Status: DC | PRN

## 2022-01-30 NOTE — Progress Notes (Signed)
Office Visit Note   Patient: Albert [name]           Date of Birth: Sep 28, 1970           MRN: [id number] Visit Date: 01/30/2022              Requested by: Everrett Coombe, DO [address]  [address] [place],  [place] [address] PCP: Everrett Coombe, DO   Assessment & Plan: Visit Diagnoses:  1. Chronic right shoulder pain     Plan: MRI of the shoulder from [facility] shows adhesive capsulitis.  There are some other findings of rotator cuff tendinitis and AC joint arthritis that appear to be degenerative.  I agree that presentation is consistent with adhesive capsulitis.  We will place an order for another glenohumeral injection as well as outpatient physical therapy.  Tramadol prescribed.  Recheck in 8 weeks.  Stressed the importance of good diabetic control and how this affects the course of adhesive capsulitis.  Most recent A1c was greater than 10.  Total face to face encounter time was greater than 45 minutes and over half of this time was spent in counseling and/or coordination of care.  Follow-Up Instructions: Return in about 8 weeks (around 03/27/2022).   Orders:  No orders of the defined types were placed in this encounter.  Meds ordered this encounter  Medications   traMADol (ULTRAM) 50 MG tablet    Sig: Take 1-2 tablets (50-100 mg total) by mouth daily as needed.    Dispense:  30 tablet    Refill:  0      Procedures: No procedures performed   Clinical Data: No additional findings.   Subjective: Chief Complaint  Patient presents with   Right Shoulder - Pain    HPI Alycia Rossetti is a 51 year old gentleman here for a workers comp injury to the right shoulder.  Works at [employer] in [place].  Fell on outstretched hand 2 months ago and jammed his shoulder.  Pain radiates from the lateral shoulder to the elbow.  Aleve not helping.  Cannot lay on the right side at night.  Has had 2 shoulder injections 1 subacromial 1 glenohumeral by Dr. Benjamin Stain but this  was done prior to the injury.  He states that he had some ongoing shoulder pain prior to the injury. Review of Systems  Constitutional: Negative.   All other systems reviewed and are negative.    Objective: Vital Signs: There were no vitals taken for this visit.  Physical Exam Vitals and nursing note reviewed.  Constitutional:      Appearance: He is well-developed.  HENT:     Head: Normocephalic and atraumatic.  Eyes:     Pupils: Pupils are equal, round, and reactive to light.  Pulmonary:     Effort: Pulmonary effort is normal.  Abdominal:     Palpations: Abdomen is soft.  Musculoskeletal:        General: Normal range of motion.     Cervical back: Neck supple.  Skin:    General: Skin is warm.  Neurological:     Mental Status: He is alert and oriented to person, place, and time.  Psychiatric:        Behavior: Behavior normal.        Thought Content: Thought content normal.        Judgment: Judgment normal.     Ortho Exam Examination of right shoulder shows moderate pain and limitation with forward flexion to 110, external rotation to  0, abduction to 75, internal rotation to back pocket.  Manual muscle testing of the rotator cuff is grossly normal with slight limitation secondary to pain.  Cervical spine is unremarkable. Specialty Comments:  No specialty comments available.  Imaging: No results found.   PMFS History: Patient Active Problem List   Diagnosis Date Noted   Chronic right shoulder pain 09/18/2021   Arthralgia 10/31/2020   Low testosterone 03/05/2020   White coat syndrome with diagnosis of hypertension 01/15/2019   Tachycardia with heart rate 100-120 beats per minute 06/15/2018   Hypertension associated with diabetes (HCC) 06/15/2018   Nausea 06/15/2018   Palpitations 03/16/2018   Dyspepsia 10/12/2017   Snoring 10/08/2017   Excessive daytime sleepiness 10/08/2017   At risk for obstructive sleep apnea 10/08/2017   Vitamin D insufficiency 08/14/2017    Type 2 diabetes mellitus with microalbuminuria, with long-term current use of insulin (HCC) 05/07/2017   Dyslipidemia associated with type 2 diabetes mellitus (HCC) 01/21/2017   Erectile dysfunction 08/04/2016   Low libido 08/04/2016   BPH associated with nocturia 08/04/2016   Insomnia 06/29/2016   Panic disorder with agoraphobia 02/22/2012   Social anxiety disorder 02/22/2012   Past Medical History:  Diagnosis Date   ADHD    Anxiety    Depression    Diabetes mellitus without complication (HCC)    Dyslipidemia associated with type 2 diabetes mellitus (HCC) 01/21/2017   Erectile dysfunction 08/04/2016   Insomnia 06/29/2016   Low libido 08/04/2016   Microalbuminuria    Shoulder dislocation 2008   Left shoulder   Social anxiety disorder    Tobacco use disorder     Family History  Problem Relation Age of Onset   Hypertension Mother    Diabetes type II Mother    Hypertension Sister    Hypertension Maternal Grandmother    Diabetes type II Maternal Grandmother    Breast cancer Paternal Grandfather    Breast cancer Paternal Grandmother     Past Surgical History:  Procedure Laterality Date   NO PAST SURGERIES     Social History   Occupational History   Not on file  Tobacco Use   Smoking status: Former    Packs/day: 1.00    Years: 25.00    Total pack years: 25.00    Types: Cigarettes    Quit date: 04/23/2017    Years since quitting: 4.7   Smokeless tobacco: Never  Vaping Use   Vaping Use: Every day   Substances: Nicotine  Substance and Sexual Activity   Alcohol use: No    Comment: Drank 6-10 beers. Last drank during Christmas   Drug use: No   Sexual activity: Yes    Partners: Female

## 2022-02-05 ENCOUNTER — Ambulatory Visit: Payer: No Typology Code available for payment source | Admitting: Family Medicine

## 2022-02-11 ENCOUNTER — Ambulatory Visit (HOSPITAL_BASED_OUTPATIENT_CLINIC_OR_DEPARTMENT_OTHER): Payer: Self-pay | Admitting: Orthopaedic Surgery

## 2022-02-15 ENCOUNTER — Other Ambulatory Visit: Payer: Self-pay | Admitting: Sports Medicine

## 2022-02-15 DIAGNOSIS — G8929 Other chronic pain: Secondary | ICD-10-CM

## 2022-02-16 ENCOUNTER — Ambulatory Visit (HOSPITAL_BASED_OUTPATIENT_CLINIC_OR_DEPARTMENT_OTHER): Payer: Self-pay | Admitting: Orthopaedic Surgery

## 2022-02-18 ENCOUNTER — Other Ambulatory Visit: Payer: Self-pay | Admitting: Orthopaedic Surgery

## 2022-02-18 ENCOUNTER — Ambulatory Visit (INDEPENDENT_AMBULATORY_CARE_PROVIDER_SITE_OTHER): Payer: No Typology Code available for payment source | Admitting: Orthopaedic Surgery

## 2022-02-18 DIAGNOSIS — M7501 Adhesive capsulitis of right shoulder: Secondary | ICD-10-CM

## 2022-02-18 DIAGNOSIS — M25511 Pain in right shoulder: Secondary | ICD-10-CM | POA: Diagnosis not present

## 2022-02-18 MED ORDER — LIDOCAINE HCL 1 % IJ SOLN
4.0000 mL | INTRAMUSCULAR | Status: AC | PRN
  Administered 2022-02-18: 4 mL

## 2022-02-18 MED ORDER — TRIAMCINOLONE ACETONIDE 40 MG/ML IJ SUSP
80.0000 mg | INTRAMUSCULAR | Status: AC | PRN
  Administered 2022-02-18: 80 mg via INTRA_ARTICULAR

## 2022-02-18 NOTE — Progress Notes (Signed)
Chief Complaint: right shoulder pain     History of Present Illness:    Albert [name] is a 51 y.o. 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  He has previously been seen by Dr. Roda Shutters and referred here for a glenohumeral injection.  He does have a history of diabetes with a recent A1c in the times.  He states that this has been going on since March when he had a fall at work.    Surgical History:   None  PMH/PSH/Family History/Social History/Meds/Allergies:    Past Medical History:  Diagnosis Date  . ADHD   . Anxiety   . Depression   . Diabetes mellitus without complication (HCC)   . Dyslipidemia associated with type 2 diabetes mellitus (HCC) 01/21/2017  . Erectile dysfunction 08/04/2016  . Insomnia 06/29/2016  . Low libido 08/04/2016  . Microalbuminuria   . Shoulder dislocation 2008   Left shoulder  . Social anxiety disorder   . Tobacco use disorder    Past Surgical History:  Procedure Laterality Date  . NO PAST SURGERIES     Social History   Socioeconomic History  . Marital status: Significant Other    Spouse name: Not on file  . Number of children: Not on file  . Years of education: Not on file  . Highest education level: Not on file  Occupational History  . Not on file  Tobacco Use  . Smoking status: Former    Packs/day: 1.00    Years: 25.00    Total pack years: 25.00    Types: Cigarettes    Quit date: 04/23/2017    Years since quitting: 4.8  . Smokeless tobacco: Never  Vaping Use  . Vaping Use: Every day  . Substances: Nicotine  Substance and Sexual Activity  . Alcohol use: No    Comment: Drank 6-10 beers. Last drank during Christmas  . Drug use: No  . Sexual activity: Yes    Partners: Female  Other Topics Concern  . Not on file  Social History Narrative  . Not on file   Social Determinants of Health   Financial Resource Strain: Not on file  Food Insecurity: Not on file   Transportation Needs: Not on file  Physical Activity: Not on file  Stress: Not on file  Social Connections: Not on file   Family History  Problem Relation Age of Onset  . Hypertension Mother   . Diabetes type II Mother   . Hypertension Sister   . Hypertension Maternal Grandmother   . Diabetes type II Maternal Grandmother   . Breast cancer Paternal Grandfather   . Breast cancer Paternal Grandmother    Allergies  Allergen Reactions  . Metformin And Related Nausea Only   Current Outpatient Medications  Medication Sig Dispense Refill  . amLODipine (NORVASC) 10 MG tablet Take 1 tablet (10 mg total) by mouth daily. 90 tablet 2  . aspirin EC 81 MG tablet Take 1 tablet (81 mg total) by mouth daily. 90 tablet 3  . atenolol (TENORMIN) 25 MG tablet TAKE 1 TABLET (25 MG TOTAL) BY MOUTH DAILY. 90 tablet 2  . atorvastatin (LIPITOR) 10 MG tablet Take 1 tablet (10 mg total) by mouth daily. 90 tablet 2  . clonazePAM (KLONOPIN) 2 MG tablet TAKE 1 TABLET BY  MOUTH 2 TIMES DAILY AS NEEDED FOR ANXIETY. 30 tablet 1  . insulin detemir (LEVEMIR FLEXTOUCH) 100 UNIT/ML FlexPen Inject 44 Units into the skin every evening. 3 mL 3  . Insulin Pen Needle (BD PEN NEEDLE NANO 2ND GEN) 32G X 4 MM MISC USE SUBCUTANEOUSLY DAILY WITH LEVEMIR 100 each 12  . Semaglutide, 1 MG/DOSE, 4 MG/3ML SOPN Inject 1 mg as directed once a week. 3 mL 1  . Semaglutide,0.25 or 0.5MG /DOS, (OZEMPIC, 0.25 OR 0.5 MG/DOSE,) 2 MG/3ML SOPN Inject 0.5mg  weekly x4 weeks then increase to 1mg  weekly. 3 mL 0  . tadalafil (CIALIS) 5 MG tablet TAKE 1-2 TABLETS (5-10 MG TOTAL) BY MOUTH DAILY AS NEEDED FOR ERECTILE DYSFUNCTION. 10 tablet 11  . testosterone (ANDROGEL) 50 MG/5GM (1%) GEL Place 5 g onto the skin daily. Apply to shoulders and upper arms 150 g 3  . traMADol (ULTRAM) 50 MG tablet Take 1-2 tablets (50-100 mg total) by mouth daily as needed. 30 tablet 0  . valsartan-hydrochlorothiazide (DIOVAN-HCT) 160-25 MG tablet Take 1 tablet by mouth  daily. 90 tablet 3  . Vilazodone HCl (VIIBRYD) 40 MG TABS Take 1 tablet (40 mg total) by mouth daily. 30 tablet 3   No current facility-administered medications for this visit.   No results found.  Review of Systems:   A ROS was performed including pertinent positives and negatives as documented in the HPI.  Physical Exam :   Constitutional: NAD and appears stated age Neurological: Alert and oriented Psych: Appropriate affect and cooperative There were no vitals taken for this visit.   Comprehensive Musculoskeletal Exam:    Musculoskeletal Exam    Inspection Right Left  Skin No atrophy or winging No atrophy or winging  Palpation    Tenderness Glenohumeral None  Range of Motion    Flexion (passive) 100 170  Flexion (active) 90 170  Abduction 90 170  ER at the side 10 70  Can reach behind back to L5 T12  Strength     Full with pain none  Special Tests    Pseudoparalytic No No  Neurologic    Fires PIN, radial, median, ulnar, musculocutaneous, axillary, suprascapular, long thoracic, and spinal accessory innervated muscles. No abnormal sensibility  Vascular/Lymphatic    Radial Pulse 2+ 2+  Cervical Exam    Patient has symmetric cervical range of motion with negative Spurling's test.  Special Test:      Imaging:   Xray (3 views right shoulder): Normal   I personally reviewed and interpreted the radiographs.   Assessment:   51 y.o. male right-hand-dominant male with right shoulder adhesive capsulitis.  At today's visit I agree with the right glenohumeral ultrasound-guided injection.  I would like to see him back in 2 weeks for reassessment as well so that we may perform an additional injection.  Will be working on gentle wall climbs at this time and I will plan to see him back in 2 weeks  Plan :    -Right ultrasound-guided injection performed after verbal consent obtained    Procedure Note  Patient: Albert [name]             Date of Birth: 11-Feb-1971            MRN: [id number]             Visit Date: 02/18/2022  Procedures: Visit Diagnoses: No diagnosis found.  Large Joint Inj: R glenohumeral on 02/18/2022 4:22 PM Indications: pain Details: 22 G 1.5 in needle, ultrasound-guided anterior approach  Arthrogram: No  Medications: 4 mL lidocaine 1 %; 80 mg triamcinolone acetonide 40 MG/ML Outcome: tolerated well, no immediate complications Procedure, treatment alternatives, risks and benefits explained, specific risks discussed. Consent was given by the patient. Immediately prior to procedure a time out was called to verify the correct patient, procedure, equipment, support staff and site/side marked as required. Patient was prepped and draped in the usual sterile fashion.          I personally saw and evaluated the patient, and participated in the management and treatment plan.  Huel Cote, MD Attending Physician, Orthopedic Surgery  This document was dictated using Dragon voice recognition software. A reasonable attempt at proof reading has been made to minimize errors.

## 2022-02-26 ENCOUNTER — Ambulatory Visit: Payer: No Typology Code available for payment source | Admitting: Family Medicine

## 2022-02-26 ENCOUNTER — Encounter: Payer: Self-pay | Admitting: Family Medicine

## 2022-02-26 VITALS — BP 96/69 | HR 112 | Ht 71.0 in | Wt 239.0 lb

## 2022-02-26 DIAGNOSIS — R809 Proteinuria, unspecified: Secondary | ICD-10-CM | POA: Diagnosis not present

## 2022-02-26 DIAGNOSIS — Z794 Long term (current) use of insulin: Secondary | ICD-10-CM

## 2022-02-26 DIAGNOSIS — E1129 Type 2 diabetes mellitus with other diabetic kidney complication: Secondary | ICD-10-CM | POA: Diagnosis not present

## 2022-02-26 LAB — POCT GLUCOSE (DEVICE FOR HOME USE): POC Glucose: 348 mg/dl — AB (ref 70–99)

## 2022-02-26 MED ORDER — FREESTYLE LIBRE 14 DAY SENSOR MISC: 1.0000 | 11 refills | Status: DC

## 2022-02-26 MED ORDER — DAPAGLIFLOZIN PROPANEDIOL 5 MG PO TABS: 5.0000 mg | ORAL_TABLET | Freq: Every day | ORAL | 3 refills | Status: DC

## 2022-02-26 MED ORDER — FREESTYLE LIBRE 3 SENSOR MISC: 1.0000 | Freq: Every day | 0 refills | Status: DC

## 2022-02-26 NOTE — Assessment & Plan Note (Signed)
-   pt reported detemir at 60u nightly will go ahead and increase to 65u nightly - have asked pt to keep control of his sugars and log them. It is unclear how he is experiencing sugars in the 250s/300s upon awakening when he is taking 66u of insulin nightly as well as ozempic weekly. He also says he only eats dinner and has one meal of 50g carbs.  - have added farxiga 5mg   - follow up in one week with written sugars so we can monitor - sample given for freestyle libre 3 so we can have continuous glucose monitoring since his sugars are erratic - have obtained A1C as our point of care would not read - glucose taken in clinic was 358. There was concerns of hypoglycemia due to hypotension, tachycardia, sweating but this is likely related to nervousness/anxiety since sugars were elevated - got lab work to assess for ketones in urine and kidney function

## 2022-02-26 NOTE — Patient Instructions (Signed)
Increase insulin to 65u nightly  Please keep track of your blood sugars: take it before you eat dinner and then again prior to bed Take your sugar One time in the morning

## 2022-02-26 NOTE — Progress Notes (Signed)
Acute Office Visit  Subjective:     Patient ID: Albert [name], male    DOB: Nov 18, 1970, 51 y.o.   MRN: [id number]  Chief Complaint  Patient presents with   Blood Sugar Problem    HPI Patient is in today for hyperglycemia. He says his sugars have been erratic for the last two weeks. He reports highest glucose reading of 390. He does admit to blurry vision. He did not present to the ED due to social anxiety. He has been trying to control sugars by decreasing food intake and carbs. He has had to take a few days off from work due to this. Recent A1C in April was 10.2. Will need point of care A1C today. He believes his ozempic is not working.   DM meds Ozempic 1mg  weekly  Insulin determir 60u nightly   He called out from work two days in a row and did not have a doctor's note. He hasn't been able to go to work.   He talked to teresa and put a request for an apt yesterday with Dr. .   He doesn't take his sugars every morning and only takes them when he is feeling bad and says his sugars are usually 250-300. He doesn't eat breakfast or dinner  Review of Systems  Constitutional:  Negative for chills and fever.  Respiratory:  Negative for cough and shortness of breath.   Cardiovascular:  Negative for chest pain.  Neurological:  Negative for headaches.        Objective:    BP 96/69   Pulse (!) 112   Ht 5\' 11"  (1.803 m)   Wt 239 lb (108.4 kg)   SpO2 97%   BMI 33.33 kg/m    Physical Exam Vitals and nursing note reviewed.  Constitutional:      General: He is not in acute distress.    Appearance: Normal appearance.  HENT:     Head: Normocephalic and atraumatic.     Right Ear: External ear normal.     Left Ear: External ear normal.     Nose: Nose normal.  Eyes:     Conjunctiva/sclera: Conjunctivae normal.  Cardiovascular:     Rate and Rhythm: Normal rate and regular rhythm.  Pulmonary:     Effort: Pulmonary effort is normal.     Breath sounds: Normal breath  sounds.  Neurological:     General: No focal deficit present.     Mental Status: He is alert and oriented to person, place, and time.  Psychiatric:        Mood and Affect: Mood normal.        Behavior: Behavior normal.        Thought Content: Thought content normal.        Judgment: Judgment normal.     Results for orders placed or performed in visit on 02/26/22  POCT Glucose (Device for Home Use)  Result Value Ref Range   Glucose Fasting, POC     POC Glucose 348 (A) 70 - 99 mg/dl        Assessment & Plan:   Problem List Items Addressed This Visit       Endocrine   Type 2 diabetes mellitus with microalbuminuria, with long-term current use of insulin (HCC) - Primary    - pt reported detemir at 60u nightly will go ahead and increase to 65u nightly - have asked pt to keep control of his sugars and log them. It is unclear how he is experiencing  sugars in the 250s/300s upon awakening when he is taking 66u of insulin nightly as well as ozempic weekly. He also says he only eats dinner and has one meal of 50g carbs.  - have added farxiga 5mg   - follow up in one week with written sugars so we can monitor - sample given for freestyle libre 3 so we can have continuous glucose monitoring since his sugars are erratic - have obtained A1C as our point of care would not read - glucose taken in clinic was 358. There was concerns of hypoglycemia due to hypotension, tachycardia, sweating but this is likely related to nervousness/anxiety since sugars were elevated - got lab work to assess for ketones in urine and kidney function      Relevant Medications   dapagliflozin propanediol (FARXIGA) 5 MG TABS tablet   Other Relevant Orders   POCT HgB A1C   POCT Glucose (Device for Home Use) (Completed)   COMPLETE METABOLIC PANEL WITH GFR   Urinalysis    Meds ordered this encounter  Medications   dapagliflozin propanediol (FARXIGA) 5 MG TABS tablet    Sig: Take 1 tablet (5 mg total) by mouth  daily before breakfast.    Dispense:  30 tablet    Refill:  3   Continuous Blood Gluc Sensor (FREESTYLE LIBRE 14 DAY SENSOR) MISC    Sig: 1 Application by Does not apply route every 14 (fourteen) days. Apply upper deltoid every 14 days, use reader to determine blood sugars    Dispense:  2 each    Refill:  11   Continuous Blood Gluc Sensor (FREESTYLE LIBRE 3 SENSOR) MISC    Sig: 1 Device by Does not apply route daily. Place 1 sensor on the skin every 14 days. Use to check glucose continuously    Dispense:  1 each    Refill:  0    Return in about 1 week (around 03/05/2022).  03/07/2022, DO

## 2022-03-01 ENCOUNTER — Other Ambulatory Visit: Payer: Self-pay | Admitting: Family Medicine

## 2022-03-01 DIAGNOSIS — F4001 Agoraphobia with panic disorder: Secondary | ICD-10-CM

## 2022-03-01 DIAGNOSIS — F4010 Social phobia, unspecified: Secondary | ICD-10-CM

## 2022-03-02 ENCOUNTER — Encounter: Payer: Self-pay | Admitting: Family Medicine

## 2022-03-02 LAB — COMPLETE METABOLIC PANEL WITH GFR
AG Ratio: 1.6 (calc) (ref 1.0–2.5)
ALT: 49 U/L — ABNORMAL HIGH (ref 9–46)
AST: 31 U/L (ref 10–40)
Albumin: 4.6 g/dL (ref 3.6–5.1)
Alkaline phosphatase (APISO): 118 U/L (ref 36–130)
BUN: 24 mg/dL (ref 7–25)
CO2: 29 mmol/L (ref 20–32)
Calcium: 10.3 mg/dL (ref 8.6–10.3)
Chloride: 96 mmol/L — ABNORMAL LOW (ref 98–110)
Creat: 0.97 mg/dL (ref 0.60–1.29)
Globulin: 2.8 g/dL (calc) (ref 1.9–3.7)
Glucose, Bld: 297 mg/dL — ABNORMAL HIGH (ref 65–99)
Potassium: 3.7 mmol/L (ref 3.5–5.3)
Sodium: 138 mmol/L (ref 135–146)
Total Bilirubin: 1 mg/dL (ref 0.2–1.2)
Total Protein: 7.4 g/dL (ref 6.1–8.1)
eGFR: 96 mL/min/{1.73_m2} (ref >=60)

## 2022-03-02 LAB — HEMOGLOBIN A1C W/OUT EAG

## 2022-03-04 ENCOUNTER — Ambulatory Visit (INDEPENDENT_AMBULATORY_CARE_PROVIDER_SITE_OTHER): Payer: No Typology Code available for payment source | Admitting: Orthopaedic Surgery

## 2022-03-04 DIAGNOSIS — M7501 Adhesive capsulitis of right shoulder: Secondary | ICD-10-CM

## 2022-03-04 MED ORDER — TRIAMCINOLONE ACETONIDE 40 MG/ML IJ SUSP
80.0000 mg | INTRAMUSCULAR | Status: AC | PRN
  Administered 2022-03-04: 80 mg via INTRA_ARTICULAR

## 2022-03-04 MED ORDER — LIDOCAINE HCL 1 % IJ SOLN
4.0000 mL | INTRAMUSCULAR | Status: AC | PRN
  Administered 2022-03-04: 4 mL

## 2022-03-04 NOTE — Progress Notes (Signed)
Chief Complaint: right shoulder pain     History of Present Illness:   03/04/2022: Presents today for follow-up status post right shoulder glenohumeral injection.  He did get some relief although this is minimal.  He is here today for further discussion.  Albert [name] is a 51 y.o. 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  He has previously been seen by Dr. Roda Shutters and referred here for a glenohumeral injection.  He does have a history of diabetes with a recent A1c in the times.  He states that this has been going on since March when he had a fall at work.    Surgical History:   None  PMH/PSH/Family History/Social History/Meds/Allergies:    Past Medical History:  Diagnosis Date   ADHD    Anxiety    Depression    Diabetes mellitus without complication (HCC)    Dyslipidemia associated with type 2 diabetes mellitus (HCC) 01/21/2017   Erectile dysfunction 08/04/2016   Insomnia 06/29/2016   Low libido 08/04/2016   Microalbuminuria    Shoulder dislocation 2008   Left shoulder   Social anxiety disorder    Tobacco use disorder    Past Surgical History:  Procedure Laterality Date   NO PAST SURGERIES     Social History   Socioeconomic History   Marital status: Significant Other    Spouse name: Not on file   Number of children: Not on file   Years of education: Not on file   Highest education level: Not on file  Occupational History   Not on file  Tobacco Use   Smoking status: Former    Packs/day: 1.00    Years: 25.00    Total pack years: 25.00    Types: Cigarettes    Quit date: 04/23/2017    Years since quitting: 4.8   Smokeless tobacco: Never  Vaping Use   Vaping Use: Every day   Substances: Nicotine  Substance and Sexual Activity   Alcohol use: No    Comment: Drank 6-10 beers. Last drank during Christmas   Drug use: No   Sexual activity: Yes    Partners: Female  Other Topics Concern   Not on file   Social History Narrative   Not on file   Social Determinants of Health   Financial Resource Strain: Not on file  Food Insecurity: Not on file  Transportation Needs: Not on file  Physical Activity: Not on file  Stress: Not on file  Social Connections: Not on file   Family History  Problem Relation Age of Onset   Hypertension Mother    Diabetes type II Mother    Hypertension Sister    Hypertension Maternal Grandmother    Diabetes type II Maternal Grandmother    Breast cancer Paternal Grandfather    Breast cancer Paternal Grandmother    Allergies  Allergen Reactions   Metformin And Related Nausea Only   Current Outpatient Medications  Medication Sig Dispense Refill   amLODipine (NORVASC) 10 MG tablet Take 1 tablet (10 mg total) by mouth daily. 90 tablet 2   aspirin EC 81 MG tablet Take 1 tablet (81 mg total) by mouth daily. 90 tablet 3   atenolol (TENORMIN) 25 MG tablet TAKE 1 TABLET (25 MG TOTAL) BY MOUTH DAILY. 90 tablet 2  atorvastatin (LIPITOR) 10 MG tablet Take 1 tablet (10 mg total) by mouth daily. 90 tablet 2   clonazePAM (KLONOPIN) 2 MG tablet TAKE 1 TABLET BY MOUTH 2 TIMES DAILY AS NEEDED FOR ANXIETY. 30 tablet 1   Continuous Blood Gluc Sensor (FREESTYLE LIBRE 14 DAY SENSOR) MISC 1 Application by Does not apply route every 14 (fourteen) days. Apply upper deltoid every 14 days, use reader to determine blood sugars 2 each 11   Continuous Blood Gluc Sensor (FREESTYLE LIBRE 3 SENSOR) MISC 1 Device by Does not apply route daily. Place 1 sensor on the skin every 14 days. Use to check glucose continuously 1 each 0   dapagliflozin propanediol (FARXIGA) 5 MG TABS tablet Take 1 tablet (5 mg total) by mouth daily before breakfast. 30 tablet 3   insulin detemir (LEVEMIR FLEXTOUCH) 100 UNIT/ML FlexPen Inject 44 Units into the skin every evening. 3 mL 3   Insulin Pen Needle (BD PEN NEEDLE NANO 2ND GEN) 32G X 4 MM MISC USE SUBCUTANEOUSLY DAILY WITH LEVEMIR 100 each 12   Semaglutide, 1  MG/DOSE, 4 MG/3ML SOPN Inject 1 mg as directed once a week. 3 mL 1   tadalafil (CIALIS) 5 MG tablet TAKE 1-2 TABLETS (5-10 MG TOTAL) BY MOUTH DAILY AS NEEDED FOR ERECTILE DYSFUNCTION. 10 tablet 11   testosterone (ANDROGEL) 50 MG/5GM (1%) GEL Place 5 g onto the skin daily. Apply to shoulders and upper arms 150 g 3   traMADol (ULTRAM) 50 MG tablet Take 1-2 tablets (50-100 mg total) by mouth daily as needed. 30 tablet 0   valsartan-hydrochlorothiazide (DIOVAN-HCT) 160-25 MG tablet Take 1 tablet by mouth daily. 90 tablet 3   Vilazodone HCl (VIIBRYD) 40 MG TABS Take 1 tablet (40 mg total) by mouth daily. 30 tablet 3   No current facility-administered medications for this visit.   No results found.  Review of Systems:   A ROS was performed including pertinent positives and negatives as documented in the HPI.  Physical Exam :   Constitutional: NAD and appears stated age Neurological: Alert and oriented Psych: Appropriate affect and cooperative There were no vitals taken for this visit.   Comprehensive Musculoskeletal Exam:    Musculoskeletal Exam    Inspection Right Left  Skin No atrophy or winging No atrophy or winging  Palpation    Tenderness Glenohumeral None  Range of Motion    Flexion (passive) 100 170  Flexion (active) 90 170  Abduction 90 170  ER at the side 10 70  Can reach behind back to L5 T12  Strength     Full with pain none  Special Tests    Pseudoparalytic No No  Neurologic    Fires PIN, radial, median, ulnar, musculocutaneous, axillary, suprascapular, long thoracic, and spinal accessory innervated muscles. No abnormal sensibility  Vascular/Lymphatic    Radial Pulse 2+ 2+  Cervical Exam    Patient has symmetric cervical range of motion with negative Spurling's test.  Special Test:      Imaging:   Xray (3 views right shoulder): Normal   I personally reviewed and interpreted the radiographs.   Assessment:   51 y.o. male right-hand-dominant male with right  shoulder adhesive capsulitis.  Given the fact that he did have minimal improvement after the last injection I would like to recommend an injection ultrasound-guided glenohumeral injection.  We will plan to proceed with this today.   Plan :    -Right ultrasound-guided injection performed after verbal consent obtained    Procedure Note  Patient: Albert [name]             Date of Birth: 10-Apr-1971           MRN: [id number]             Visit Date: 03/04/2022  Procedures: Visit Diagnoses: No diagnosis found.  Large Joint Inj: R glenohumeral on 03/04/2022 2:51 PM Indications: pain Details: 22 G 1.5 in needle, ultrasound-guided anterior approach  Arthrogram: No  Medications: 4 mL lidocaine 1 %; 80 mg triamcinolone acetonide 40 MG/ML Outcome: tolerated well, no immediate complications Procedure, treatment alternatives, risks and benefits explained, specific risks discussed. Consent was given by the patient. Immediately prior to procedure a time out was called to verify the correct patient, procedure, equipment, support staff and site/side marked as required. Patient was prepped and draped in the usual sterile fashion.          I personally saw and evaluated the patient, and participated in the management and treatment plan.  Huel Cote, MD Attending Physician, Orthopedic Surgery  This document was dictated using Dragon voice recognition software. A reasonable attempt at proof reading has been made to minimize errors.

## 2022-03-05 ENCOUNTER — Encounter: Payer: Self-pay | Admitting: Family Medicine

## 2022-03-05 ENCOUNTER — Other Ambulatory Visit: Payer: Self-pay | Admitting: Family Medicine

## 2022-03-05 ENCOUNTER — Ambulatory Visit: Payer: No Typology Code available for payment source | Admitting: Family Medicine

## 2022-03-05 VITALS — BP 117/83 | HR 103 | Ht 71.0 in | Wt 244.0 lb

## 2022-03-05 DIAGNOSIS — R809 Proteinuria, unspecified: Secondary | ICD-10-CM

## 2022-03-05 DIAGNOSIS — E1129 Type 2 diabetes mellitus with other diabetic kidney complication: Secondary | ICD-10-CM

## 2022-03-05 DIAGNOSIS — Z794 Long term (current) use of insulin: Secondary | ICD-10-CM | POA: Diagnosis not present

## 2022-03-05 MED ORDER — TIRZEPATIDE 2.5 MG/0.5ML ~~LOC~~ SOAJ: 2.5000 mg | SUBCUTANEOUS | 2 refills | Status: DC

## 2022-03-05 NOTE — Patient Instructions (Addendum)
Take 35u of insulin in the am  Take 35u in the pm  Please record all food and drink intake

## 2022-03-05 NOTE — Progress Notes (Signed)
Acute Office Visit  Subjective:     Patient ID: Albert [name], male    DOB: 31-Jul-1970, 51 y.o.   MRN: [id number]  Chief Complaint  Patient presents with   Follow-up    HPI Patient is in today for follow up on sugars. He is still noting sugars in the 250s-300s. He is still experiencing symptoms. He is also noting a sensation in his left big toe. He denies redness but says it is tender at the joint and feels stiff when bending. Denies throbbing. Denies trauma.   Foods: he has been eating very little. He ate chicken thighs air fried for dinner.    Review of Systems  Constitutional:  Negative for chills and fever.  Respiratory:  Negative for cough and shortness of breath.   Cardiovascular:  Negative for chest pain.  Neurological:  Negative for headaches.      Objective:    BP 117/83   Pulse (!) 103   Ht 5\' 11"  (1.803 m)   Wt 244 lb (110.7 kg)   SpO2 99%   BMI 34.03 kg/m    Physical Exam Vitals and nursing note reviewed.  Constitutional:      General: He is not in acute distress.    Appearance: Normal appearance.  HENT:     Head: Normocephalic and atraumatic.     Right Ear: External ear normal.     Left Ear: External ear normal.     Nose: Nose normal.  Eyes:     Conjunctiva/sclera: Conjunctivae normal.  Cardiovascular:     Rate and Rhythm: Normal rate.  Pulmonary:     Effort: Pulmonary effort is normal.  Musculoskeletal:     Comments: Right toe is normal. No erythema. No tenderness to palpation   Neurological:     General: No focal deficit present.     Mental Status: He is alert and oriented to person, place, and time.  Psychiatric:        Mood and Affect: Mood normal.        Behavior: Behavior normal.        Thought Content: Thought content normal.        Judgment: Judgment normal.     No results found for any visits on 03/05/22.      Assessment & Plan:   Problem List Items Addressed This Visit       Endocrine   Type 2 diabetes mellitus with  microalbuminuria, with long-term current use of insulin (HCC) - Primary    - blood sugars still poorly controlled provides ranges in the 200s-300s - have added mounjaro  - have split long acting insulin in half for 35u in the am and 35u in the pm  - unsure if there is a compliance issue with insulin. POC A1C was cancelled likely because it was too high for our machine - have asked pt to write down all his food intake and liquid intake (he says he only eats once a day and only drinks diet soda and water)  - if unable to control sugars at next visit he will need to go to endocrine  - did give work excuse for one week.       Relevant Medications   tirzepatide (MOUNJARO) 2.5 MG/0.5ML Pen   Other Relevant Orders   HgB A1c    Meds ordered this encounter  Medications   tirzepatide (MOUNJARO) 2.5 MG/0.5ML Pen    Sig: Inject 2.5 mg into the skin once a week.    Dispense:  2 mL    Refill:  2    Return in about 1 week (around 03/12/2022).  Charlton Amor, DO

## 2022-03-05 NOTE — Assessment & Plan Note (Signed)
-   blood sugars still poorly controlled provides ranges in the 200s-300s - have added mounjaro  - have split long acting insulin in half for 35u in the am and 35u in the pm  - unsure if there is a compliance issue with insulin. POC A1C was cancelled likely because it was too high for our machine - have asked pt to write down all his food intake and liquid intake (he says he only eats once a day and only drinks diet soda and water)  - if unable to control sugars at next visit he will need to go to endocrine  - did give work excuse for one week.

## 2022-03-06 ENCOUNTER — Encounter: Payer: Self-pay | Admitting: Family Medicine

## 2022-03-06 DIAGNOSIS — F4001 Agoraphobia with panic disorder: Secondary | ICD-10-CM

## 2022-03-06 DIAGNOSIS — F4010 Social phobia, unspecified: Secondary | ICD-10-CM

## 2022-03-06 NOTE — Telephone Encounter (Signed)
Last OV: 03/05/22 Next OV: 03/12/22 Last RF: 01/12/22

## 2022-03-06 NOTE — Telephone Encounter (Signed)
Last RX was 7/3-23 for #30 with 1 refill.  Last OV was 03/05/22 with Dr. Tamera Punt.  Tiajuana Amass, CMA

## 2022-03-10 ENCOUNTER — Other Ambulatory Visit: Payer: Self-pay | Admitting: Family Medicine

## 2022-03-10 DIAGNOSIS — F4001 Agoraphobia with panic disorder: Secondary | ICD-10-CM

## 2022-03-10 DIAGNOSIS — F4010 Social phobia, unspecified: Secondary | ICD-10-CM

## 2022-03-12 ENCOUNTER — Ambulatory Visit: Payer: Self-pay | Admitting: Family Medicine

## 2022-03-12 ENCOUNTER — Encounter: Payer: Self-pay | Admitting: Family Medicine

## 2022-03-17 ENCOUNTER — Other Ambulatory Visit: Payer: Self-pay | Admitting: Family Medicine

## 2022-03-17 DIAGNOSIS — F4010 Social phobia, unspecified: Secondary | ICD-10-CM

## 2022-03-17 DIAGNOSIS — F4001 Agoraphobia with panic disorder: Secondary | ICD-10-CM

## 2022-03-18 ENCOUNTER — Telehealth (INDEPENDENT_AMBULATORY_CARE_PROVIDER_SITE_OTHER): Payer: No Typology Code available for payment source | Admitting: Family Medicine

## 2022-03-18 ENCOUNTER — Encounter: Payer: Self-pay | Admitting: Family Medicine

## 2022-03-18 DIAGNOSIS — F4010 Social phobia, unspecified: Secondary | ICD-10-CM | POA: Diagnosis not present

## 2022-03-18 DIAGNOSIS — R809 Proteinuria, unspecified: Secondary | ICD-10-CM

## 2022-03-18 DIAGNOSIS — Z794 Long term (current) use of insulin: Secondary | ICD-10-CM

## 2022-03-18 DIAGNOSIS — E1129 Type 2 diabetes mellitus with other diabetic kidney complication: Secondary | ICD-10-CM | POA: Diagnosis not present

## 2022-03-18 DIAGNOSIS — F4001 Agoraphobia with panic disorder: Secondary | ICD-10-CM

## 2022-03-18 MED ORDER — CLONAZEPAM 2 MG PO TABS: 2.0000 mg | ORAL_TABLET | Freq: Two times a day (BID) | ORAL | 4 refills | Status: DC | PRN

## 2022-03-18 MED ORDER — BUSPIRONE HCL 15 MG PO TABS
15.0000 mg | ORAL_TABLET | Freq: Three times a day (TID) | ORAL | 1 refills | Status: DC
Start: 2022-03-18 — End: 2022-04-09

## 2022-03-18 MED ORDER — FREESTYLE LIBRE 3 SENSOR MISC: 6 refills | Status: DC

## 2022-03-18 NOTE — Assessment & Plan Note (Signed)
Continues to deal with significant anxiety.  Continue Viibryd daily.  We discussed try to reduce his clonazepam.  30 tabs need to last a full month.  Adding BuSpar 15 mg 3 times daily.  Referral placed to psychiatry.

## 2022-03-18 NOTE — Telephone Encounter (Signed)
Last RX was 01/12/22 for #30 with 1 refill.  Last OV was 03/05/22 with Dr. Tamera Punt.

## 2022-03-18 NOTE — Assessment & Plan Note (Signed)
Reports diabetes has been better controlled.  Continue to monitor closely with CGM.

## 2022-03-18 NOTE — Progress Notes (Signed)
Albert [name] - 51 y.o. male MRN [id number]  Date of birth: February 01, 1971   This visit type was conducted due to national recommendations for restrictions regarding the COVID-19 Pandemic (e.g. social distancing).  This format is felt to be most appropriate for this patient at this time.  All issues noted in this document were discussed and addressed.  No physical exam was performed (except for noted visual exam findings with Video Visits).  I discussed the limitations of evaluation and management by telemedicine and the availability of in person appointments. The patient expressed understanding and agreed to proceed.  I connected withNAME@ on 03/18/22 at 10:30 AM EDT by a video enabled telemedicine application and verified that I am speaking with the correct person using two identifiers.  Present at visit: Everrett Coombe, DO [place] Person   Patient Location: Home [address] [place] [address]   Provider location:   [facility]  Chief Complaint  Patient presents with   Anxiety    HPI  Albert [name] is a 51 y.o. male who presents via audio/video conferencing for a telehealth visit today.  Following up today for anxiety.  Reports that he continues to experience significant anxiety with panic.  Continues on Viibryd daily as well as clonazepam 2 mg up to twice daily as needed.  He has requested clonazepam refills early recently due to his increased anxiety.  He has been taking these on a scheduled basis rather than just as needed.  He would be interested in referral to psychiatry at this point.  Reports diabetes has been better controlled.  He is using CGM and glucose is 85 today.    ROS:  A comprehensive ROS was completed and negative except as noted per HPI  Past Medical History:  Diagnosis Date   ADHD    Anxiety    Depression    Diabetes mellitus without complication (HCC)    Dyslipidemia associated with type 2 diabetes mellitus (HCC) 01/21/2017   Erectile dysfunction  08/04/2016   Insomnia 06/29/2016   Low libido 08/04/2016   Microalbuminuria    Shoulder dislocation 2008   Left shoulder   Social anxiety disorder    Tobacco use disorder     Past Surgical History:  Procedure Laterality Date   NO PAST SURGERIES      Family History  Problem Relation Age of Onset   Hypertension Mother    Diabetes type II Mother    Hypertension Sister    Hypertension Maternal Grandmother    Diabetes type II Maternal Grandmother    Breast cancer Paternal Grandfather    Breast cancer Paternal Grandmother     Social History   Socioeconomic History   Marital status: Significant Other    Spouse name: Not on file   Number of children: Not on file   Years of education: Not on file   Highest education level: Not on file  Occupational History   Not on file  Tobacco Use   Smoking status: Former    Packs/day: 1.00    Years: 25.00    Total pack years: 25.00    Types: Cigarettes    Quit date: 04/23/2017    Years since quitting: 4.9   Smokeless tobacco: Never  Vaping Use   Vaping Use: Every day   Substances: Nicotine  Substance and Sexual Activity   Alcohol use: No    Comment: Drank 6-10 beers. Last drank during Christmas   Drug use: No   Sexual activity: Yes  Partners: Female  Other Topics Concern   Not on file  Social History Narrative   Not on file   Social Determinants of Health   Financial Resource Strain: Not on file  Food Insecurity: Not on file  Transportation Needs: Not on file  Physical Activity: Not on file  Stress: Not on file  Social Connections: Not on file  Intimate Partner Violence: Not on file     Current Outpatient Medications:    amLODipine (NORVASC) 10 MG tablet, Take 1 tablet (10 mg total) by mouth daily., Disp: 90 tablet, Rfl: 2   aspirin EC 81 MG tablet, Take 1 tablet (81 mg total) by mouth daily., Disp: 90 tablet, Rfl: 3   atenolol (TENORMIN) 25 MG tablet, TAKE 1 TABLET (25 MG TOTAL) BY MOUTH DAILY., Disp: 90 tablet,  Rfl: 2   atorvastatin (LIPITOR) 10 MG tablet, Take 1 tablet (10 mg total) by mouth daily., Disp: 90 tablet, Rfl: 2   busPIRone (BUSPAR) 15 MG tablet, Take 1 tablet (15 mg total) by mouth 3 (three) times daily., Disp: 90 tablet, Rfl: 1   clonazePAM (KLONOPIN) 2 MG tablet, Take 1 tablet (2 mg total) by mouth 2 (two) times daily as needed for anxiety. 30 tabs must last 30 days., Disp: 30 tablet, Rfl: 4   Continuous Blood Gluc Sensor (FREESTYLE LIBRE 3 SENSOR) MISC, Place 1 sensor on the skin every 14 days. Use to check glucose continuously, Disp: 2 each, Rfl: 6   dapagliflozin propanediol (FARXIGA) 5 MG TABS tablet, Take 1 tablet (5 mg total) by mouth daily before breakfast., Disp: 30 tablet, Rfl: 3   insulin detemir (LEVEMIR FLEXTOUCH) 100 UNIT/ML FlexPen, Inject 44 Units into the skin every evening., Disp: 3 mL, Rfl: 3   Insulin Pen Needle (BD PEN NEEDLE NANO 2ND GEN) 32G X 4 MM MISC, USE SUBCUTANEOUSLY DAILY WITH LEVEMIR, Disp: 100 each, Rfl: 12   liraglutide (VICTOZA) 18 MG/3ML SOPN, Inject 0.6 mg into the skin daily., Disp: 3 mL, Rfl: 0   tadalafil (CIALIS) 5 MG tablet, TAKE 1-2 TABLETS (5-10 MG TOTAL) BY MOUTH DAILY AS NEEDED FOR ERECTILE DYSFUNCTION., Disp: 10 tablet, Rfl: 11   testosterone (ANDROGEL) 50 MG/5GM (1%) GEL, Place 5 g onto the skin daily. Apply to shoulders and upper arms, Disp: 150 g, Rfl: 3   traMADol (ULTRAM) 50 MG tablet, Take 1-2 tablets (50-100 mg total) by mouth daily as needed., Disp: 30 tablet, Rfl: 0   valsartan-hydrochlorothiazide (DIOVAN-HCT) 160-25 MG tablet, Take 1 tablet by mouth daily., Disp: 90 tablet, Rfl: 3   Vilazodone HCl (VIIBRYD) 40 MG TABS, Take 1 tablet (40 mg total) by mouth daily., Disp: 30 tablet, Rfl: 3  EXAM:  VITALS per patient if applicable: There were no vitals taken for this visit.  GENERAL: alert, oriented, appears well and in no acute distress  HEENT: atraumatic, conjunttiva clear, no obvious abnormalities on inspection of external nose and  ears  NECK: normal movements of the head and neck  LUNGS: on inspection no signs of respiratory distress, breathing rate appears normal, no obvious gross SOB, gasping or wheezing  CV: no obvious cyanosis  MS: moves all visible extremities without noticeable abnormality  PSYCH/NEURO: pleasant and cooperative, no obvious depression or anxiety, speech and thought processing grossly intact  ASSESSMENT AND PLAN:  Discussed the following assessment and plan:  Panic disorder with agoraphobia Continues to deal with significant anxiety.  Continue Viibryd daily.  We discussed try to reduce his clonazepam.  30 tabs need to last  a full month.  Adding BuSpar 15 mg 3 times daily.  Referral placed to psychiatry.  Type 2 diabetes mellitus with microalbuminuria, with long-term current use of insulin (HCC) Reports diabetes has been better controlled.  Continue to monitor closely with CGM.     I discussed the assessment and treatment plan with the patient. The patient was provided an opportunity to ask questions and all were answered. The patient agreed with the plan and demonstrated an understanding of the instructions.   The patient was advised to call back or seek an in-person evaluation if the symptoms worsen or if the condition fails to improve as anticipated.    Everrett Coombe, DO

## 2022-03-27 ENCOUNTER — Telehealth: Payer: Self-pay

## 2022-03-27 NOTE — Telephone Encounter (Signed)
Pt had a note that stated that he was written out of work until August 31st and now needs a letter to return to work as of September 1st. Pt would like if you put the letter in [place] and he would like to pick it up in the office as well. tvt

## 2022-03-30 ENCOUNTER — Encounter: Payer: Self-pay | Admitting: Family Medicine

## 2022-03-30 ENCOUNTER — Other Ambulatory Visit: Payer: Self-pay | Admitting: Family Medicine

## 2022-03-31 ENCOUNTER — Ambulatory Visit: Payer: Self-pay | Admitting: Orthopaedic Surgery

## 2022-04-01 ENCOUNTER — Other Ambulatory Visit: Payer: Self-pay | Admitting: Family Medicine

## 2022-04-01 MED ORDER — LEVEMIR FLEXTOUCH 100 UNIT/ML ~~LOC~~ SOPN: 44.0000 [IU] | PEN_INJECTOR | Freq: Every evening | SUBCUTANEOUS | 3 refills | Status: DC

## 2022-04-09 ENCOUNTER — Other Ambulatory Visit: Payer: Self-pay | Admitting: Family Medicine

## 2022-04-14 IMAGING — DX DG SHOULDER 2+V*R*
4 series · 4 of 4 positions shown · non-contrast
Comparison: None.

CLINICAL DATA: Left shoulder pain localized over deltoid, worse
with abduction.

EXAM:
RIGHT SHOULDER - 2+ VIEW

[shoulder grashey]
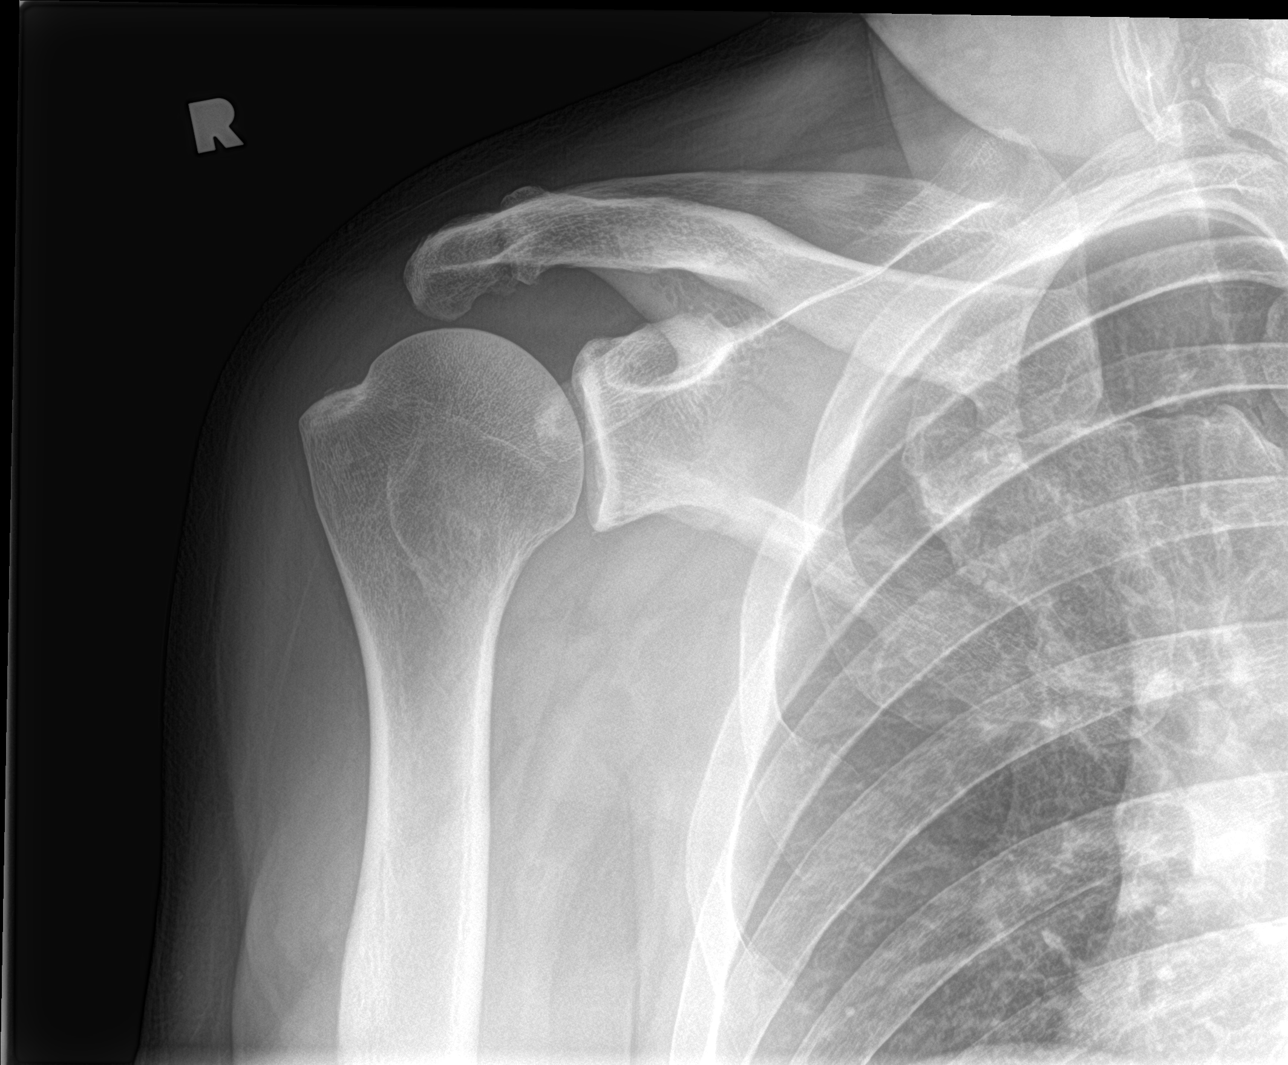

[shoulder y view]
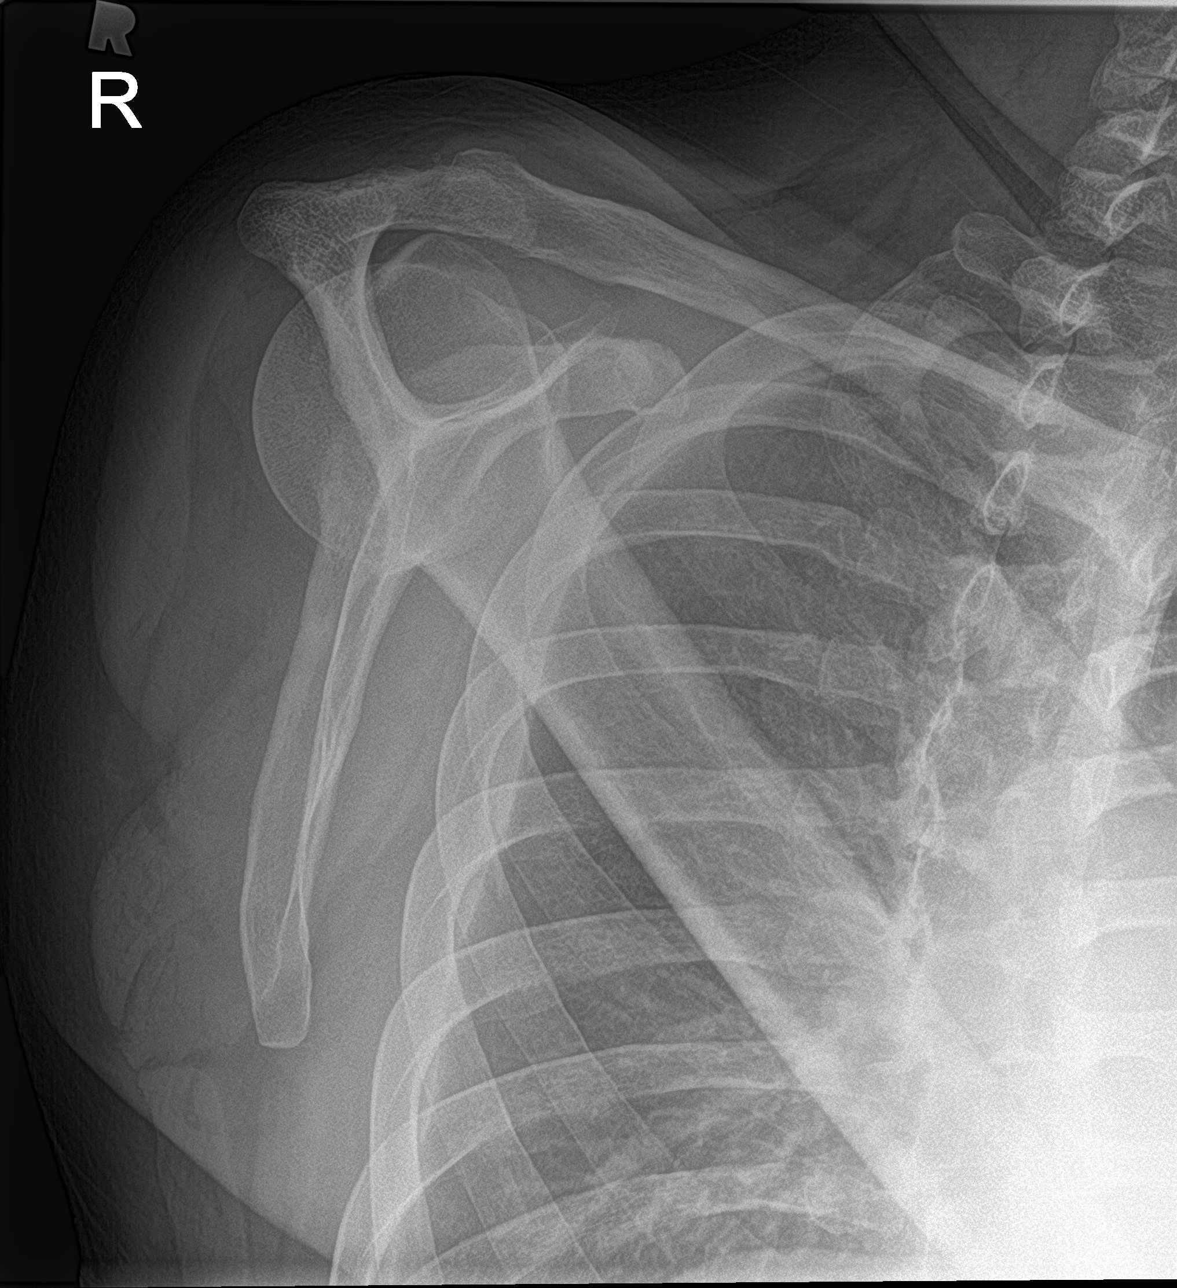

[shoulder axillary (1 of 2)]
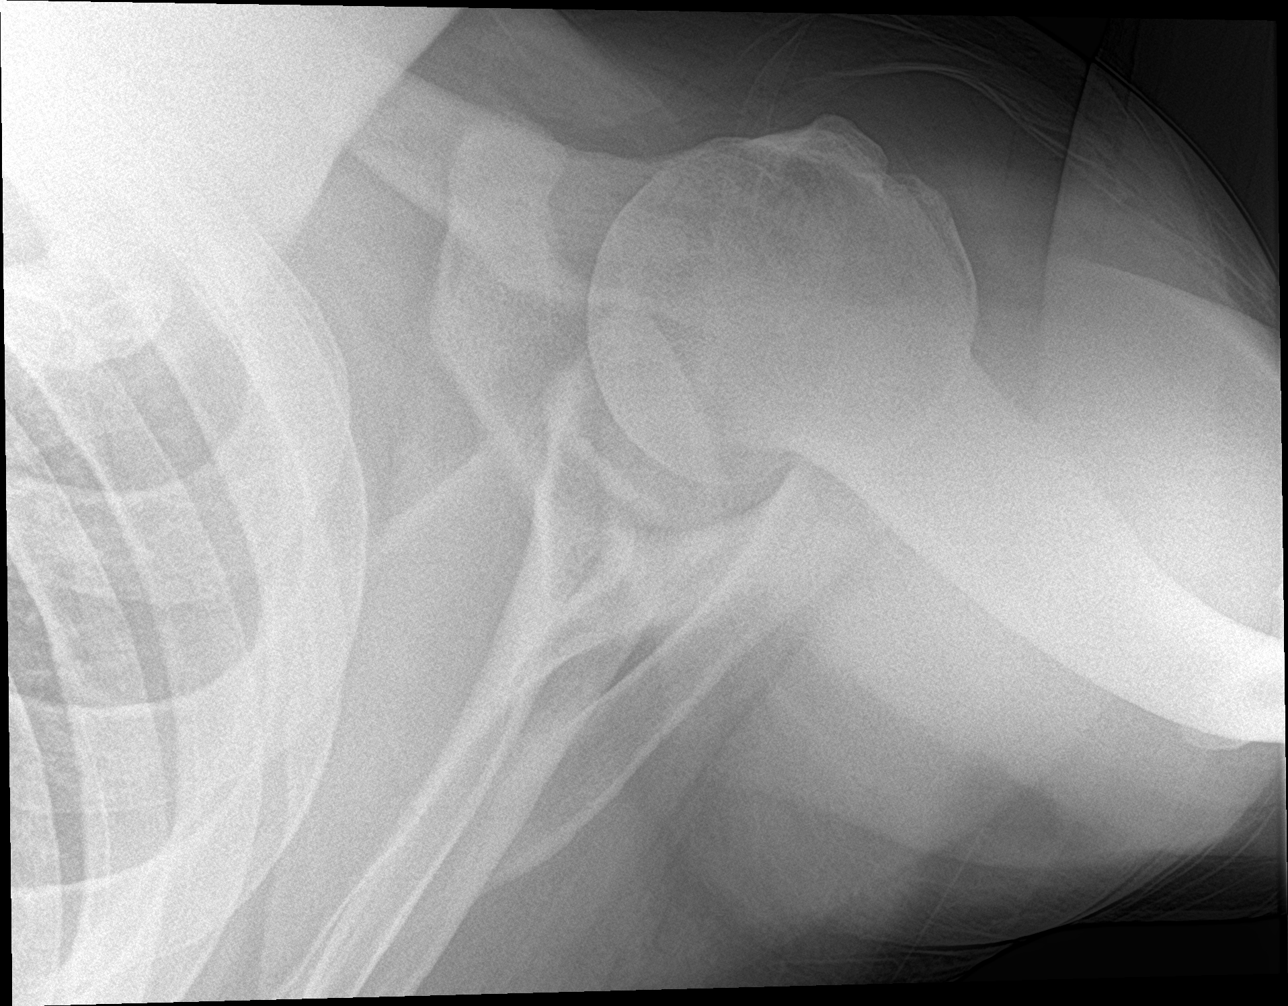

[shoulder axillary (2 of 2)]
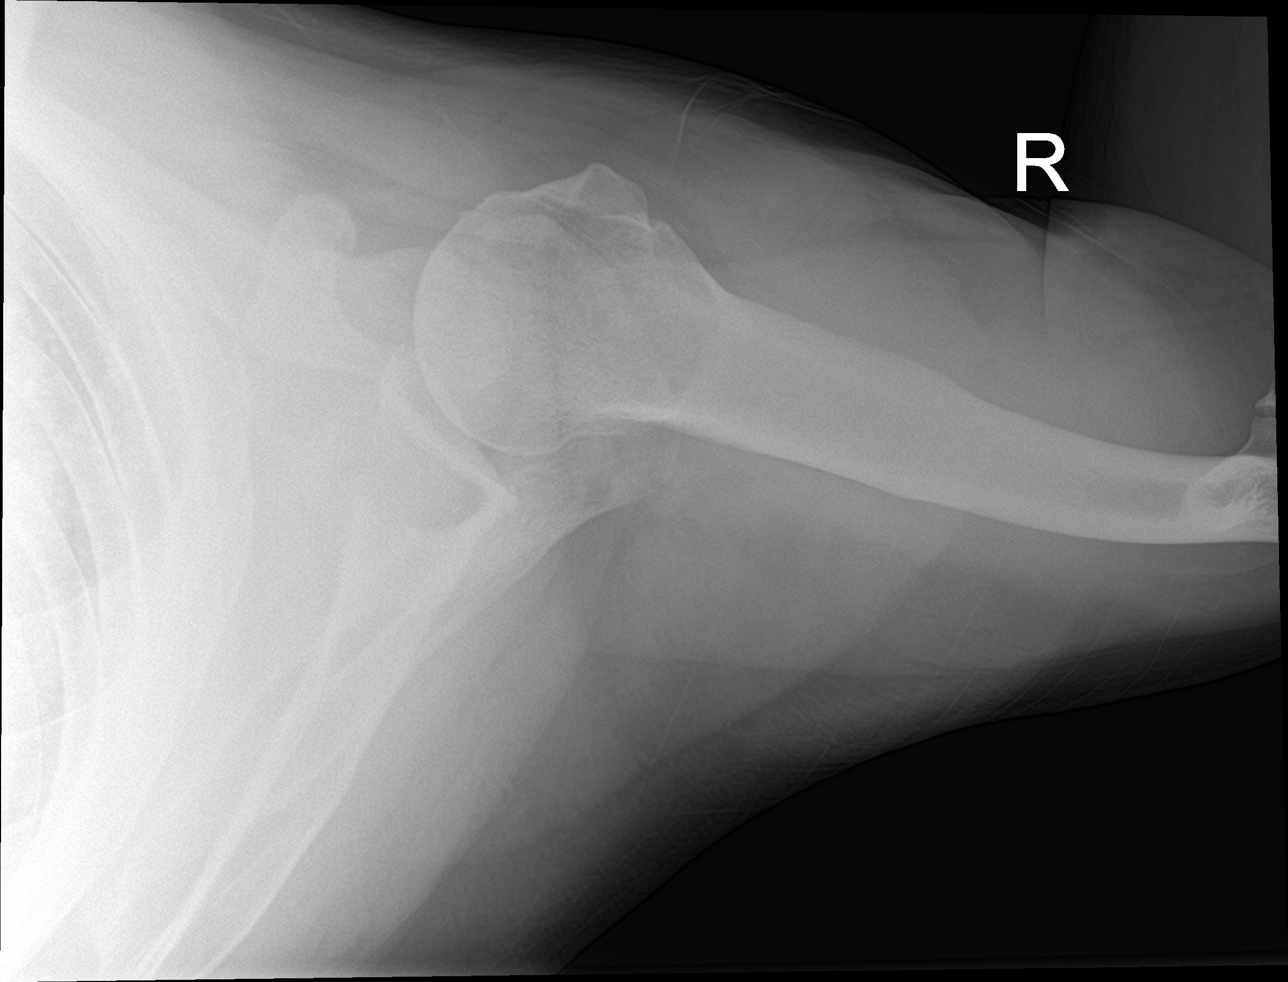

[4 of 4 positions shown; findings below may reference images not displayed]

FINDINGS: There is no evidence of fracture or dislocation. Acromioclavicular
joint space narrowing with subacromial spurring. Mild glenohumeral
joint space narrowing with minimal spurring. Soft tissues are
unremarkable.
IMPRESSION: 1.  No evidence of fracture or dislocation.

2.  Mild acromioclavicular and glenohumeral osteoarthritis.

## 2022-04-15 ENCOUNTER — Other Ambulatory Visit (HOSPITAL_BASED_OUTPATIENT_CLINIC_OR_DEPARTMENT_OTHER): Payer: Self-pay

## 2022-04-15 ENCOUNTER — Ambulatory Visit (INDEPENDENT_AMBULATORY_CARE_PROVIDER_SITE_OTHER): Payer: No Typology Code available for payment source | Admitting: Orthopaedic Surgery

## 2022-04-15 DIAGNOSIS — M7501 Adhesive capsulitis of right shoulder: Secondary | ICD-10-CM

## 2022-04-15 MED ORDER — TRAMADOL HCL 50 MG PO TABS
50.0000 mg | ORAL_TABLET | Freq: Four times a day (QID) | ORAL | 1 refills | Status: DC | PRN
  Filled 2022-04-15: qty 30, 7d supply, fill #0

## 2022-04-15 NOTE — Progress Notes (Signed)
Chief Complaint: right shoulder pain     History of Present Illness:   04/15/2022: Presents today for follow-up after his second right shoulder glenohumeral injection.  At this time his range of motion continues to be quite poor.  He is making some minor improvements.  His A1c most recently gone from 10-7.  He is here today for further assessment.  Albert [name] is a 51 y.o. 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  He has previously been seen by Dr. Roda Shutters and referred here for a glenohumeral injection.  He does have a history of diabetes with a recent A1c in the times.  He states that this has been going on since March when he had a fall at work.    Surgical History:   None  PMH/PSH/Family History/Social History/Meds/Allergies:    Past Medical History:  Diagnosis Date   ADHD    Anxiety    Depression    Diabetes mellitus without complication (HCC)    Dyslipidemia associated with type 2 diabetes mellitus (HCC) 01/21/2017   Erectile dysfunction 08/04/2016   Insomnia 06/29/2016   Low libido 08/04/2016   Microalbuminuria    Shoulder dislocation 2008   Left shoulder   Social anxiety disorder    Tobacco use disorder    Past Surgical History:  Procedure Laterality Date   NO PAST SURGERIES     Social History   Socioeconomic History   Marital status: Significant Other    Spouse name: Not on file   Number of children: Not on file   Years of education: Not on file   Highest education level: Not on file  Occupational History   Not on file  Tobacco Use   Smoking status: Former    Packs/day: 1.00    Years: 25.00    Total pack years: 25.00    Types: Cigarettes    Quit date: 04/23/2017    Years since quitting: 4.9   Smokeless tobacco: Never  Vaping Use   Vaping Use: Every day   Substances: Nicotine  Substance and Sexual Activity   Alcohol use: No    Comment: Drank 6-10 beers. Last drank during Christmas    Drug use: No   Sexual activity: Yes    Partners: Female  Other Topics Concern   Not on file  Social History Narrative   Not on file   Social Determinants of Health   Financial Resource Strain: Not on file  Food Insecurity: Not on file  Transportation Needs: Not on file  Physical Activity: Not on file  Stress: Not on file  Social Connections: Not on file   Family History  Problem Relation Age of Onset   Hypertension Mother    Diabetes type II Mother    Hypertension Sister    Hypertension Maternal Grandmother    Diabetes type II Maternal Grandmother    Breast cancer Paternal Grandfather    Breast cancer Paternal Grandmother    Allergies  Allergen Reactions   Metformin And Related Nausea Only   Current Outpatient Medications  Medication Sig Dispense Refill   traMADol (ULTRAM) 50 MG tablet Take 1 tablet (50 mg total) by mouth every 6 (six) hours as needed. 30 tablet 1   amLODipine (NORVASC) 10 MG tablet Take 1 tablet (10 mg total) by mouth  daily. 90 tablet 2   aspirin EC 81 MG tablet Take 1 tablet (81 mg total) by mouth daily. 90 tablet 3   atenolol (TENORMIN) 25 MG tablet TAKE 1 TABLET (25 MG TOTAL) BY MOUTH DAILY. 90 tablet 2   atorvastatin (LIPITOR) 10 MG tablet Take 1 tablet (10 mg total) by mouth daily. 90 tablet 2   busPIRone (BUSPAR) 15 MG tablet TAKE 1 TABLET BY MOUTH 3 TIMES DAILY. 270 tablet 1   clonazePAM (KLONOPIN) 2 MG tablet Take 1 tablet (2 mg total) by mouth 2 (two) times daily as needed for anxiety. 30 tabs must last 30 days. 30 tablet 4   Continuous Blood Gluc Sensor (FREESTYLE LIBRE 3 SENSOR) MISC Place 1 sensor on the skin every 14 days. Use to check glucose continuously 2 each 6   dapagliflozin propanediol (FARXIGA) 5 MG TABS tablet Take 1 tablet (5 mg total) by mouth daily before breakfast. 30 tablet 3   insulin detemir (LEVEMIR FLEXTOUCH) 100 UNIT/ML FlexPen Inject 44 Units into the skin every evening. 3 mL 3   Insulin Pen Needle (BD PEN NEEDLE NANO 2ND  GEN) 32G X 4 MM MISC USE SUBCUTANEOUSLY DAILY WITH LEVEMIR 100 each 12   liraglutide (VICTOZA) 18 MG/3ML SOPN Inject 0.6 mg into the skin daily. 3 mL 0   tadalafil (CIALIS) 5 MG tablet TAKE 1-2 TABLETS (5-10 MG TOTAL) BY MOUTH DAILY AS NEEDED FOR ERECTILE DYSFUNCTION. 10 tablet 11   testosterone (ANDROGEL) 50 MG/5GM (1%) GEL Place 5 g onto the skin daily. Apply to shoulders and upper arms 150 g 3   traMADol (ULTRAM) 50 MG tablet Take 1-2 tablets (50-100 mg total) by mouth daily as needed. 30 tablet 0   valsartan-hydrochlorothiazide (DIOVAN-HCT) 160-25 MG tablet Take 1 tablet by mouth daily. 90 tablet 3   Vilazodone HCl (VIIBRYD) 40 MG TABS Take 1 tablet (40 mg total) by mouth daily. 30 tablet 3   No current facility-administered medications for this visit.   No results found.  Review of Systems:   A ROS was performed including pertinent positives and negatives as documented in the HPI.  Physical Exam :   Constitutional: NAD and appears stated age Neurological: Alert and oriented Psych: Appropriate affect and cooperative There were no vitals taken for this visit.   Comprehensive Musculoskeletal Exam:    Musculoskeletal Exam    Inspection Right Left  Skin No atrophy or winging No atrophy or winging  Palpation    Tenderness Glenohumeral None  Range of Motion    Flexion (passive) 100 170  Flexion (active) 90 170  Abduction 90 170  ER at the side 10 70  Can reach behind back to L5 T12  Strength     Full with pain none  Special Tests    Pseudoparalytic No No  Neurologic    Fires PIN, radial, median, ulnar, musculocutaneous, axillary, suprascapular, long thoracic, and spinal accessory innervated muscles. No abnormal sensibility  Vascular/Lymphatic    Radial Pulse 2+ 2+  Cervical Exam    Patient has symmetric cervical range of motion with negative Spurling's test.  Special Test:      Imaging:   Xray (3 views right shoulder): Normal   I personally reviewed and interpreted  the radiographs.   Assessment:   51 y.o. male right-hand-dominant male with right shoulder adhesive capsulitis.  At this time he is having a hard time getting scheduled physical therapy for his right shoulder.  I do believe that this would be a good measure  prior to any arthroscopic release.  I did describe that given the fact that he is not getting any type of improvement in his motion from the injections that he may ultimately go on to need an arthroscopic release.  He will plan to continue physical therapy in the next month and he will bring his CD next visit.  Plan :    -Return to clinic in 1 month following physical therapy for right shoulder       I personally saw and evaluated the patient, and participated in the management and treatment plan.  Vanetta Mulders, MD Attending Physician, Orthopedic Surgery  This document was dictated using Dragon voice recognition software. A reasonable attempt at proof reading has been made to minimize errors.

## 2022-04-16 LAB — HEMOGLOBIN A1C
Hgb A1c MFr Bld: 7.6 % of total Hgb — ABNORMAL HIGH (ref <5.7)
Mean Plasma Glucose: 171 mg/dL
eAG (mmol/L): 9.5 mmol/L

## 2022-04-23 ENCOUNTER — Encounter (HOSPITAL_COMMUNITY): Payer: Self-pay | Admitting: Psychiatry

## 2022-04-23 ENCOUNTER — Ambulatory Visit (INDEPENDENT_AMBULATORY_CARE_PROVIDER_SITE_OTHER): Payer: No Typology Code available for payment source | Admitting: Psychiatry

## 2022-04-23 VITALS — BP 136/100 | HR 102 | Temp 97.8°F | Ht 71.0 in | Wt 242.0 lb

## 2022-04-23 DIAGNOSIS — F411 Generalized anxiety disorder: Secondary | ICD-10-CM | POA: Diagnosis not present

## 2022-04-23 DIAGNOSIS — F330 Major depressive disorder, recurrent, mild: Secondary | ICD-10-CM | POA: Diagnosis not present

## 2022-04-23 DIAGNOSIS — F4001 Agoraphobia with panic disorder: Secondary | ICD-10-CM

## 2022-04-23 NOTE — Progress Notes (Signed)
Psychiatric Initial Adult Assessment   Patient Identification: Albert [name] MRN:  [id number] Date of Evaluation:  04/23/2022 Referral Source: Dr. Einar Pheasant Chief Complaint:   Chief Complaint  Patient presents with   Establish Care   Anxiety   Visit Diagnosis:    ICD-10-CM   1. MDD (major depressive disorder), recurrent episode, mild ([place])  F33.0     2. GAD (generalized anxiety disorder)  F41.1     3. Panic disorder with agoraphobia  F40.01       History of Present Illness: Patient is a 51 year old currently single Caucasian male referred by primary care physician to establish care for anxiety he has been diagnosed with panic disorder and anxiety condition.  Currently works at [employer] full-time does not have any kids  Patient has been seen in this clinic by me in 2018 and was kept on Paxil, Inderal.  He has not seen afterwards and he has been following up with primary care providers and for the last 2 years has been getting Klonopin for anxiety or panic condition.  Nearly a month ago he was off medication because was not able to refill it and had a panic attack including sweating nausea palpitation extreme anxiety and feeling of despair.  He got his Klonopin back he is taking 2 mg a day usually takes half during the day and a half if needed more and it controls his anxiety or panic attacks.  He has had some stressors with his grandma dying 1 year ago and at times job stress leading to panic or anxiety as stated above.  Currently is not on any relationship he does worry he worries excessively at times including related to finances, job, dying.  States other medication we tried but he remains resistant to change or add another medication.  Benzodiazepine dependence discussed including depression worsening of depression at times risk of suicide with overdose.  Tolerance but despite discussion the remains persistent to be on benzodiazepine as these control his panic attacks and social  anxiety  He does worry when he has to meet people or in social situations and feels Klonopin does help and wants to continue he understands not to increase the dose and he is getting it from primary care physician  Does not endorse feeling of despair or hopelessness or sadness on a day-to-day basis he has had some depressive days in the past but he also associates depression with anxiety  There is no associated psychotic symptoms manic symptoms currently or in the past  Aggravating factors; worry of dying.  Grandmother's death 1 year ago, shoulder injury poor sleep at times Modifying factors; parents, outdoor stuff playing golf  Duration more than 7 years Severity fluctuates gets panicky without medication  Hospital admission denies suicide attempt denies   Past Psychiatric History: anxiety, depression  Previous Psychotropic Medications: Yes   Substance Abuse History in the last 12 months:  Yes.    Consequences of Substance Abuse: Effect of alcohol on depression, judjement discussed  Past Medical History:  Past Medical History:  Diagnosis Date   ADHD    Anxiety    Depression    Diabetes mellitus without complication ([place])    Dyslipidemia associated with type 2 diabetes mellitus ([place]) 01/21/2017   Erectile dysfunction 08/04/2016   Insomnia 06/29/2016   Low libido 08/04/2016   Microalbuminuria    Shoulder dislocation 2008   Left shoulder   Social anxiety disorder    Tobacco use disorder     Past Surgical History:  Procedure Laterality Date   NO PAST SURGERIES      Family Psychiatric History: brother ; depression  Family History:  Family History  Problem Relation Age of Onset   Hypertension Mother    Diabetes type II Mother    Hypertension Sister    Hypertension Maternal Grandmother    Diabetes type II Maternal Grandmother    Breast cancer Paternal Grandfather    Breast cancer Paternal Grandmother     Social History:   Social History   Socioeconomic History    Marital status: Significant Other    Spouse name: Not on file   Number of children: Not on file   Years of education: Not on file   Highest education level: Not on file  Occupational History   Not on file  Tobacco Use   Smoking status: Former    Packs/day: 1.00    Years: 25.00    Total pack years: 25.00    Types: Cigarettes    Quit date: 04/23/2017    Years since quitting: 5.0   Smokeless tobacco: Never  Vaping Use   Vaping Use: Every day   Substances: Nicotine  Substance and Sexual Activity   Alcohol use: No    Comment: Drank 6-10 beers. Last drank during Christmas   Drug use: No   Sexual activity: Yes    Partners: Female  Other Topics Concern   Not on file  Social History Narrative   Not on file   Social Determinants of Health   Financial Resource Strain: Not on file  Food Insecurity: Not on file  Transportation Needs: Not on file  Physical Activity: Not on file  Stress: Not on file  Social Connections: Not on file    Additional Social History: grew up with parents, states some memories of sexual related concerns with other kids overall does not have flashbacks   Allergies:   Allergies  Allergen Reactions   Metformin And Related Nausea Only    Metabolic Disorder Labs: Lab Results  Component Value Date   HGBA1C 7.6 (H) 03/05/2022   MPG 171 03/05/2022   MPG 246 11/06/2021   No results found for: "PROLACTIN" Lab Results  Component Value Date   CHOL 142 11/06/2021   TRIG 122 11/06/2021   HDL 53 11/06/2021   CHOLHDL 2.7 11/06/2021   LDLCALC 69 11/06/2021   LDLCALC 94 01/17/2020   Lab Results  Component Value Date   TSH 1.25 06/29/2016    Therapeutic Level Labs: No results found for: "LITHIUM" No results found for: "CBMZ" No results found for: "VALPROATE"  Current Medications: Current Outpatient Medications  Medication Sig Dispense Refill   amLODipine (NORVASC) 10 MG tablet Take 1 tablet (10 mg total) by mouth daily. 90 tablet 2   aspirin  EC 81 MG tablet Take 1 tablet (81 mg total) by mouth daily. 90 tablet 3   atenolol (TENORMIN) 25 MG tablet TAKE 1 TABLET (25 MG TOTAL) BY MOUTH DAILY. 90 tablet 2   atorvastatin (LIPITOR) 10 MG tablet Take 1 tablet (10 mg total) by mouth daily. 90 tablet 2   busPIRone (BUSPAR) 15 MG tablet TAKE 1 TABLET BY MOUTH 3 TIMES DAILY. 270 tablet 1   clonazePAM (KLONOPIN) 2 MG tablet Take 1 tablet (2 mg total) by mouth 2 (two) times daily as needed for anxiety. 30 tabs must last 30 days. 30 tablet 4   Continuous Blood Gluc Sensor (FREESTYLE LIBRE 3 SENSOR) MISC Place 1 sensor on the skin every 14 days. Use to  check glucose continuously 2 each 6   insulin detemir (LEVEMIR FLEXTOUCH) 100 UNIT/ML FlexPen Inject 44 Units into the skin every evening. 3 mL 3   Insulin Pen Needle (BD PEN NEEDLE NANO 2ND GEN) 32G X 4 MM MISC USE SUBCUTANEOUSLY DAILY WITH LEVEMIR 100 each 12   liraglutide (VICTOZA) 18 MG/3ML SOPN Inject 0.6 mg into the skin daily. 3 mL 0   tadalafil (CIALIS) 5 MG tablet TAKE 1-2 TABLETS (5-10 MG TOTAL) BY MOUTH DAILY AS NEEDED FOR ERECTILE DYSFUNCTION. 10 tablet 11   testosterone (ANDROGEL) 50 MG/5GM (1%) GEL Place 5 g onto the skin daily. Apply to shoulders and upper arms 150 g 3   traMADol (ULTRAM) 50 MG tablet Take 1-2 tablets (50-100 mg total) by mouth daily as needed. 30 tablet 0   traMADol (ULTRAM) 50 MG tablet Take 1 tablet (50 mg total) by mouth every 6 (six) hours as needed. 30 tablet 1   valsartan-hydrochlorothiazide (DIOVAN-HCT) 160-25 MG tablet Take 1 tablet by mouth daily. 90 tablet 3   Vilazodone HCl (VIIBRYD) 40 MG TABS Take 1 tablet (40 mg total) by mouth daily. 30 tablet 3   dapagliflozin propanediol (FARXIGA) 5 MG TABS tablet Take 1 tablet (5 mg total) by mouth daily before breakfast. 30 tablet 3   No current facility-administered medications for this visit.     Psychiatric Specialty Exam: Review of Systems  Cardiovascular:  Negative for chest pain.  Neurological:  Negative  for tremors.  Psychiatric/Behavioral:  The patient is nervous/anxious.     Blood pressure (!) 136/100, pulse (!) 102, temperature 97.8 F (36.6 C), height 5\' 11"  (1.803 m), weight 242 lb (109.8 kg), SpO2 100 %.Body mass index is 33.75 kg/m.  General Appearance: Casual  Eye Contact:  Fair  Speech:  Clear and Coherent  Volume:  Decreased  Mood:  Anxious  Affect:  Congruent  Thought Process:  Goal Directed  Orientation:  Full (Time, Place, and Person)  Thought Content:  Rumination  Suicidal Thoughts:  No  Homicidal Thoughts:  No  Memory:  Immediate;   Fair  Judgement:  Fair  Insight:  Fair  Psychomotor Activity:  Decreased  Concentration:  Concentration: Fair  Recall:  Smiley House[place] of [place]: Good  Akathisia:  No  Handed:    AIMS (if indicated):  not done  Assets:  Desire for Improvement Financial Resources/Insurance Housing  ADL's:  Intact  Cognition: WNL  Sleep:  Fair   Screenings: GAD-7    Flowsheet Row Office Visit from 11/06/2021 in [facility] Office Visit from 03/18/2021 in [place] Visit from 10/31/2020 in [facility] [place] Visit from 05/28/2020 in [place] Visit from 01/17/2020 in [place]  Total GAD-7 Score 15 14 10 10 6       PHQ2-9    [place] Office Visit from 04/23/2022 in [place] Office Visit from 11/06/2021 in [place] Visit from 03/18/2021 in [place] Visit from 01/06/2021 in [place] Office Visit from 10/31/2020 in [place]  PHQ-2 Total Score 2 6 5 3 3   PHQ-9 Total Score 10 19 15 12 5       [place] Office Visit from 04/23/2022  in [place]  C-SSRS RISK CATEGORY No Risk       Assessment and Plan: as follows  Panic disorder with agoraphobia; he is on Klonopin maximum dose is 2 mg he understands to lower the dose once his stress level is better he does not want to change does not want to be on any other SSRIs or other medications Dr. Einar Pheasant can continue Klonopin he does have refills available patient again discussed to not increase the dose and at times lower the dose and take half only per day instead of 1 tablet of 2 mg.  We recommend therapy to work on panic anxiety and social anxiety he will be helpful for that  Generalized anxiety disorder; on Klonopin.  Does not want to be on Paxil other medications that have been used before he can continue BuSpar 2 times a day instead of 3 times a day as he feels that 3 times a day or 2 times a day would not make a big difference  Social anxiety disorder continue work on distraction from negative thoughts.  Recommend therapy and BuSpar he is also on Klonopin for panic attacks  Follow-up in 2 months Benzodiazepine prescription is being provided by Dr. Einar Pheasant he can call for BuSpar refills as of now is going to cut down the dose to 2 times a day Questions addressed risk of being on benzodiazepine also discussed  Collaboration of Care: Primary Care Provider AEB notes and referral reviewed  Patient/Guardian was advised Release of Information must be obtained prior to any record release in order to collaborate their care with an outside provider. Patient/Guardian was advised if they have not already done so to contact the registration department to sign all necessary forms in order for [place] to release information regarding their care.   Consent: Patient/Guardian gives verbal consent for treatment and assignment of benefits for services provided during this visit. Patient/Guardian expressed understanding and agreed to proceed.  Direct care time  spent 60 minutes including chart review documentation and collaboration and face-to-face Merian Capron, MD 10/12/202311:46 AM

## 2022-04-24 ENCOUNTER — Encounter: Payer: Self-pay | Admitting: Family Medicine

## 2022-04-24 MED ORDER — PEN NEEDLES 31G X 8 MM MISC: 99 refills | Status: AC

## 2022-04-29 ENCOUNTER — Other Ambulatory Visit: Payer: Self-pay | Admitting: Family Medicine

## 2022-05-06 ENCOUNTER — Other Ambulatory Visit: Payer: Self-pay | Admitting: Family Medicine

## 2022-05-06 DIAGNOSIS — Z794 Long term (current) use of insulin: Secondary | ICD-10-CM

## 2022-05-06 NOTE — Telephone Encounter (Signed)
Please contact to see if he is taking Victoza

## 2022-05-12 ENCOUNTER — Ambulatory Visit (HOSPITAL_COMMUNITY): Payer: No Typology Code available for payment source | Admitting: Licensed Clinical Social Worker

## 2022-05-13 ENCOUNTER — Other Ambulatory Visit (HOSPITAL_BASED_OUTPATIENT_CLINIC_OR_DEPARTMENT_OTHER): Payer: Self-pay

## 2022-05-13 ENCOUNTER — Ambulatory Visit (INDEPENDENT_AMBULATORY_CARE_PROVIDER_SITE_OTHER): Payer: No Typology Code available for payment source | Admitting: Orthopaedic Surgery

## 2022-05-13 DIAGNOSIS — M7501 Adhesive capsulitis of right shoulder: Secondary | ICD-10-CM

## 2022-05-13 MED ORDER — IBUPROFEN 800 MG PO TABS
800.0000 mg | ORAL_TABLET | Freq: Three times a day (TID) | ORAL | 0 refills | Status: DC
  Filled 2022-05-13: qty 30, 10d supply, fill #0

## 2022-05-13 MED ORDER — ACETAMINOPHEN 500 MG PO TABS
500.0000 mg | ORAL_TABLET | Freq: Three times a day (TID) | ORAL | 0 refills | Status: AC
  Filled 2022-05-13: qty 30, 10d supply, fill #0

## 2022-05-13 MED ORDER — OXYCODONE HCL 5 MG PO TABS
5.0000 mg | ORAL_TABLET | ORAL | 0 refills | Status: DC | PRN
  Filled 2022-05-13: qty 20, 4d supply, fill #0

## 2022-05-13 MED ORDER — ASPIRIN 325 MG PO TBEC
325.0000 mg | DELAYED_RELEASE_TABLET | Freq: Every day | ORAL | 0 refills | Status: DC
  Filled 2022-05-13: qty 30, 30d supply, fill #0

## 2022-05-13 NOTE — Progress Notes (Signed)
Chief Complaint: right shoulder pain     History of Present Illness:   05/13/2022: Presents today for follow-up of his right shoulder.  He is continue to work with physical therapy and has very limited gain range of motion from this.  He continues to have persistent pain throughout the shoulder.  His most recent A1c was 7.5.  Albert [name] is a 51 y.o. 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  He has previously been seen by Dr. Erlinda Hong and referred here for a glenohumeral injection.  He does have a history of diabetes with a recent A1c in the times.  He states that this has been going on since March when he had a fall at work.    Surgical History:   None  PMH/PSH/Family History/Social History/Meds/Allergies:    Past Medical History:  Diagnosis Date   ADHD    Anxiety    Depression    Diabetes mellitus without complication ([place])    Dyslipidemia associated with type 2 diabetes mellitus ([place]) 01/21/2017   Erectile dysfunction 08/04/2016   Insomnia 06/29/2016   Low libido 08/04/2016   Microalbuminuria    Shoulder dislocation 2008   Left shoulder   Social anxiety disorder    Tobacco use disorder    Past Surgical History:  Procedure Laterality Date   NO PAST SURGERIES     Social History   Socioeconomic History   Marital status: Significant Other    Spouse name: Not on file   Number of children: Not on file   Years of education: Not on file   Highest education level: Not on file  Occupational History   Not on file  Tobacco Use   Smoking status: Former    Packs/day: 1.00    Years: 25.00    Total pack years: 25.00    Types: Cigarettes    Quit date: 04/23/2017    Years since quitting: 5.0   Smokeless tobacco: Never  Vaping Use   Vaping Use: Every day   Substances: Nicotine  Substance and Sexual Activity   Alcohol use: No    Comment: Drank 6-10 beers. Last drank during Christmas   Drug use: No   Sexual  activity: Yes    Partners: Female  Other Topics Concern   Not on file  Social History Narrative   Not on file   Social Determinants of Health   Financial Resource Strain: Not on file  Food Insecurity: Not on file  Transportation Needs: Not on file  Physical Activity: Not on file  Stress: Not on file  Social Connections: Not on file   Family History  Problem Relation Age of Onset   Hypertension Mother    Diabetes type II Mother    Hypertension Sister    Hypertension Maternal Grandmother    Diabetes type II Maternal Grandmother    Breast cancer Paternal Grandfather    Breast cancer Paternal Grandmother    Allergies  Allergen Reactions   Metformin And Related Nausea Only   Current Outpatient Medications  Medication Sig Dispense Refill   acetaminophen (TYLENOL) 500 MG tablet Take 1 tablet (500 mg total) by mouth every 8 (eight) hours for 10 days. 30 tablet 0   aspirin EC 325 MG tablet Take 1 tablet (325 mg total) by mouth daily. [place]  tablet 0   ibuprofen (ADVIL) 800 MG tablet Take 1 tablet (800 mg total) by mouth every 8 (eight) hours for 10 days. Please take with food, please alternate with acetaminophen 30 tablet 0   oxycodone (OXY-IR) 5 MG capsule Take 1 capsule (5 mg total) by mouth every 4 (four) hours as needed (severe pain). 20 capsule 0   amLODipine (NORVASC) 10 MG tablet Take 1 tablet (10 mg total) by mouth daily. 90 tablet 2   aspirin EC 81 MG tablet Take 1 tablet (81 mg total) by mouth daily. 90 tablet 3   atenolol (TENORMIN) 25 MG tablet TAKE 1 TABLET (25 MG TOTAL) BY MOUTH DAILY. 90 tablet 2   atorvastatin (LIPITOR) 10 MG tablet Take 1 tablet (10 mg total) by mouth daily. 90 tablet 2   busPIRone (BUSPAR) 15 MG tablet TAKE 1 TABLET BY MOUTH 3 TIMES DAILY. 270 tablet 1   clonazePAM (KLONOPIN) 2 MG tablet Take 1 tablet (2 mg total) by mouth 2 (two) times daily as needed for anxiety. 30 tabs must last 30 days. 30 tablet 4   Continuous Blood Gluc Sensor (FREESTYLE LIBRE 3  SENSOR) MISC Place 1 sensor on the skin every 14 days. Use to check glucose continuously 2 each 6   dapagliflozin propanediol (FARXIGA) 5 MG TABS tablet Take 1 tablet (5 mg total) by mouth daily before breakfast. 30 tablet 3   insulin detemir (LEVEMIR FLEXTOUCH) 100 UNIT/ML FlexPen Inject 44 Units into the skin every evening. 3 mL 3   Insulin Pen Needle (BD PEN NEEDLE NANO 2ND GEN) 32G X 4 MM MISC USE SUBCUTANEOUSLY DAILY WITH LEVEMIR 100 each 12   Insulin Pen Needle (PEN NEEDLES) 31G X 8 MM MISC Use to inject Victoza daily. 100 each PRN   tadalafil (CIALIS) 5 MG tablet TAKE 1-2 TABLETS (5-10 MG TOTAL) BY MOUTH DAILY AS NEEDED FOR ERECTILE DYSFUNCTION. 10 tablet 11   testosterone (ANDROGEL) 50 MG/5GM (1%) GEL Place 5 g onto the skin daily. Apply to shoulders and upper arms 150 g 3   traMADol (ULTRAM) 50 MG tablet Take 1-2 tablets (50-100 mg total) by mouth daily as needed. 30 tablet 0   traMADol (ULTRAM) 50 MG tablet Take 1 tablet (50 mg total) by mouth every 6 (six) hours as needed. 30 tablet 1   valsartan-hydrochlorothiazide (DIOVAN-HCT) 160-25 MG tablet Take 1 tablet by mouth daily. 90 tablet 3   VICTOZA 18 MG/3ML SOPN INJECT 0.6 MG UNDER THE SKIN ONCE DAILY 18 mL 0   Vilazodone HCl (VIIBRYD) 40 MG TABS TAKE 1 TABLET BY MOUTH EVERY DAY 30 tablet 3   No current facility-administered medications for this visit.   No results found.  Review of Systems:   A ROS was performed including pertinent positives and negatives as documented in the HPI.  Physical Exam :   Constitutional: NAD and appears stated age Neurological: Alert and oriented Psych: Appropriate affect and cooperative There were no vitals taken for this visit.   Comprehensive Musculoskeletal Exam:    Musculoskeletal Exam    Inspection Right Left  Skin No atrophy or winging No atrophy or winging  Palpation    Tenderness Glenohumeral None  Range of Motion    Flexion (passive) 100 170  Flexion (active) 90 170  Abduction 90  170  ER at the side 10 70  Can reach behind back to L5 T12  Strength     Full with pain none  Special Tests    Pseudoparalytic No No  Neurologic    Fires PIN, radial, median, ulnar, musculocutaneous, axillary, suprascapular, long thoracic, and spinal accessory innervated muscles. No abnormal sensibility  Vascular/Lymphatic    Radial Pulse 2+ 2+  Cervical Exam    Patient has symmetric cervical range of motion with negative Spurling's test.  Special Test:      Imaging:   Xray (3 views right shoulder): Normal  MRI right shoulder: There is thickened inferior capsule consistent with adhesive capsulitis.  He does have some interstitial tearing involving the supraspinatus but no discrete tear  I personally reviewed and interpreted the radiographs.   Assessment:   51 y.o. male right-hand-dominant male with right shoulder adhesive capsulitis.  At this time he has now failed multiple injections and physical therapy and does have persistent pain with limited overhead activity.  This is inhibiting his ability to work and perform most basic activities.  Side effect I did discuss with him shoulder debridement.  I did discuss that he has a small interstitial tear of the rotator cuff.  I did describe that unless this is more involved than his MRI suggest that I would not recommend any type of repair of this as this would inhibit his ability to rehab following surgery.  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  I did describe that aggressive postoperative rehab would be required in order to allow for early and improve range of motion.  Finally I did also discuss complications associated with arthroscopic debridement and manipulation such as a proximal humerus fracture.  He understands these and would like to proceed  Plan :    -Plan for right shoulder arthroscopy with extensive debridement,  distal clavicle resection, manipulation under anesthesia       I personally saw and evaluated the patient, and participated in the management and treatment plan.  Huel Cote, MD Attending Physician, Orthopedic Surgery  This document was dictated using Dragon voice recognition software. A reasonable attempt at proof reading has been made to minimize errors.

## 2022-05-18 ENCOUNTER — Other Ambulatory Visit (HOSPITAL_BASED_OUTPATIENT_CLINIC_OR_DEPARTMENT_OTHER): Payer: Self-pay

## 2022-05-18 ENCOUNTER — Other Ambulatory Visit (HOSPITAL_BASED_OUTPATIENT_CLINIC_OR_DEPARTMENT_OTHER): Payer: Self-pay | Admitting: Orthopaedic Surgery

## 2022-05-18 ENCOUNTER — Ambulatory Visit (HOSPITAL_BASED_OUTPATIENT_CLINIC_OR_DEPARTMENT_OTHER): Payer: Self-pay | Admitting: Orthopaedic Surgery

## 2022-05-18 ENCOUNTER — Encounter (HOSPITAL_BASED_OUTPATIENT_CLINIC_OR_DEPARTMENT_OTHER): Payer: Self-pay | Admitting: Orthopaedic Surgery

## 2022-05-18 MED ORDER — IBUPROFEN 800 MG PO TABS: 800.0000 mg | ORAL_TABLET | Freq: Three times a day (TID) | ORAL | 0 refills | Status: AC

## 2022-05-18 MED ORDER — ASPIRIN 325 MG PO TBEC: 325.0000 mg | DELAYED_RELEASE_TABLET | Freq: Every day | ORAL | 0 refills | Status: DC

## 2022-05-18 MED ORDER — ACETAMINOPHEN 500 MG PO TABS: 500.0000 mg | ORAL_TABLET | Freq: Three times a day (TID) | ORAL | 0 refills | Status: DC

## 2022-05-18 MED ORDER — OXYCODONE HCL 5 MG PO CAPS: 5.0000 mg | ORAL_CAPSULE | ORAL | 0 refills | Status: DC | PRN

## 2022-05-19 ENCOUNTER — Telehealth: Payer: Self-pay | Admitting: Orthopaedic Surgery

## 2022-05-19 ENCOUNTER — Other Ambulatory Visit (HOSPITAL_BASED_OUTPATIENT_CLINIC_OR_DEPARTMENT_OTHER): Payer: Self-pay

## 2022-05-19 ENCOUNTER — Ambulatory Visit (INDEPENDENT_AMBULATORY_CARE_PROVIDER_SITE_OTHER): Payer: No Typology Code available for payment source | Admitting: Family Medicine

## 2022-05-19 ENCOUNTER — Encounter: Payer: Self-pay | Admitting: Family Medicine

## 2022-05-19 VITALS — BP 108/75 | HR 102 | Ht 71.0 in | Wt 242.0 lb

## 2022-05-19 DIAGNOSIS — G4719 Other hypersomnia: Secondary | ICD-10-CM

## 2022-05-19 DIAGNOSIS — R5383 Other fatigue: Secondary | ICD-10-CM | POA: Diagnosis not present

## 2022-05-19 DIAGNOSIS — G479 Sleep disorder, unspecified: Secondary | ICD-10-CM

## 2022-05-19 DIAGNOSIS — E1129 Type 2 diabetes mellitus with other diabetic kidney complication: Secondary | ICD-10-CM

## 2022-05-19 DIAGNOSIS — R7989 Other specified abnormal findings of blood chemistry: Secondary | ICD-10-CM

## 2022-05-19 DIAGNOSIS — E1159 Type 2 diabetes mellitus with other circulatory complications: Secondary | ICD-10-CM

## 2022-05-19 DIAGNOSIS — R809 Proteinuria, unspecified: Secondary | ICD-10-CM

## 2022-05-19 DIAGNOSIS — F4010 Social phobia, unspecified: Secondary | ICD-10-CM

## 2022-05-19 DIAGNOSIS — Z794 Long term (current) use of insulin: Secondary | ICD-10-CM

## 2022-05-19 DIAGNOSIS — I152 Hypertension secondary to endocrine disorders: Secondary | ICD-10-CM

## 2022-05-19 LAB — HM DIABETES EYE EXAM

## 2022-05-19 MED ORDER — TADALAFIL 20 MG PO TABS: 10.0000 mg | ORAL_TABLET | ORAL | 2 refills | Status: DC | PRN

## 2022-05-19 NOTE — Assessment & Plan Note (Signed)
Blood pressures well controlled at this time.  Recommend continuation of current medications.

## 2022-05-19 NOTE — Telephone Encounter (Signed)
RC to [place] who will refax the care plan as we did not receive it

## 2022-05-19 NOTE — Telephone Encounter (Signed)
Albert [name] from [facility] [phone] .  Needs care plan that was sent on 05/12/2022 faxed for patient Albert [name]

## 2022-05-19 NOTE — Assessment & Plan Note (Signed)
Stop bang is elevated with [place] that does not look too bad.  Recommend referral for sleep study due to his ongoing fatigue.

## 2022-05-19 NOTE — Assessment & Plan Note (Signed)
Lab Results  Component Value Date   HGBA1C 7.6 (H) 03/05/2022  Blood sugars seem to be gradually improving.  Continue to work on dietary change.  Recommend incorporation of regular exercise to help with his blood sugars as well as management of his anxiety.

## 2022-05-19 NOTE — Assessment & Plan Note (Signed)
Continues to have significant anxiety.  He is seeing psychiatry at this time however his psychiatrist told him that he does not manage benzodiazepines.  I discussed with him that the goal will be to try and taper him back on this some as he continues to find a good baseline antianxiety medication that works well for him.  As well as regular therapy.

## 2022-05-19 NOTE — Progress Notes (Signed)
Albert [name] - 51 y.o. male MRN [id number]  Date of birth: [birth year]  Subjective Chief Complaint  Patient presents with   Diabetes   Hypertension    HPI Albert [name] is a 51 year old male here today for follow-up visit.  Seen orthopedics recently and is scheduled to have arthroscopic debridement and manipulation for frozen shoulder.  Reports he continues to feel quite fatigued.  He states his blood sugars have improved.  He continues on Victoza in addition to Levemir, 44 units daily.  He has not had any significant side effects including hypoglycemia with current medications.    He continues to have significant anxiety.  Remains on Viibryd daily.  He also remains dependent on clonazepam throughout the day for management of his anxiety and panic symptoms.  He is seeing psychiatry but has not really found this to be too helpful so far.  Continues to suffer from fatigue which she is unsure if related to his depression or anxiety or something else.  He did try testosterone previously but did not notice any improvement in his fatigue with this.  He would like to have these levels rechecked.  Blood pressure remains stable with current medications.  No side effects at this time.  Denies chest pain, shortness of breath, palpitations, headaches or vision changes.  ROS:  A comprehensive ROS was completed and negative except as noted per HPI  Allergies  Allergen Reactions   Metformin And Related Nausea Only    Past Medical History:  Diagnosis Date   ADHD    Anxiety    Depression    Diabetes mellitus without complication ([place])    Dyslipidemia associated with type 2 diabetes mellitus ([place]) 01/21/2017   Erectile dysfunction 08/04/2016   Insomnia 06/29/2016   Low libido 08/04/2016   Microalbuminuria    Shoulder dislocation 2008   Left shoulder   Social anxiety disorder    Tobacco use disorder     Past Surgical History:  Procedure Laterality Date   NO PAST SURGERIES      Social History    Socioeconomic History   Marital status: Significant Other    Spouse name: Not on file   Number of children: Not on file   Years of education: Not on file   Highest education level: Not on file  Occupational History   Not on file  Tobacco Use   Smoking status: Former    Packs/day: 1.00    Years: 25.00    Total pack years: 25.00    Types: Cigarettes    Quit date: 04/23/2017    Years since quitting: 5.0   Smokeless tobacco: Never  Vaping Use   Vaping Use: Every day   Substances: Nicotine  Substance and Sexual Activity   Alcohol use: No    Comment: Drank 6-10 beers. Last drank during Christmas   Drug use: No   Sexual activity: Yes    Partners: Female  Other Topics Concern   Not on file  Social History Narrative   Not on file   Social Determinants of Health   Financial Resource Strain: Not on file  Food Insecurity: Not on file  Transportation Needs: Not on file  Physical Activity: Not on file  Stress: Not on file  Social Connections: Not on file    Family History  Problem Relation Age of Onset   Hypertension Mother    Diabetes type II Mother    Hypertension Sister    Hypertension Maternal Grandmother    Diabetes type II Maternal Grandmother  Breast cancer Paternal Grandfather    Breast cancer Paternal Grandmother     Health Maintenance  Topic Date Due   Diabetic kidney evaluation - Urine ACR  01/12/2018   COVID-19 Vaccine (3 - Moderna series) 01/26/2020   Lung Cancer Screening  Never done   Zoster Vaccines- Shingrix (1 of 2) Never done   COLONOSCOPY (Pts 45-67yrs Insurance coverage will need to be confirmed)  11/07/2022 (Originally 03/27/2017)   Hepatitis C Screening  11/07/2022 (Originally 03/27/1990)   HIV Screening  11/07/2022 (Originally 03/28/1987)   HEMOGLOBIN A1C  09/05/2022   Diabetic kidney evaluation - GFR measurement  02/27/2023   FOOT EXAM  05/20/2023   OPHTHALMOLOGY EXAM  05/20/2023   TETANUS/TDAP  12/05/2028   HPV VACCINES  Aged Out    INFLUENZA VACCINE  Discontinued     ----------------------------------------------------------------------------------------------------------------------------------------------------------------------------------------------------------------- Physical Exam BP 108/75 (BP Location: Left Arm, Patient Position: Sitting, Cuff Size: Large)   Pulse (!) 102   Ht 5\' 11"  (1.803 m)   Wt 242 lb (109.8 kg)   SpO2 97%   BMI 33.75 kg/m   Physical Exam Constitutional:      Appearance: Normal appearance.  HENT:     Head: Normocephalic and atraumatic.  Eyes:     General: No scleral icterus. Cardiovascular:     Rate and Rhythm: Normal rate and regular rhythm.  Pulmonary:     Effort: Pulmonary effort is normal.     Breath sounds: Normal breath sounds.  Musculoskeletal:     Cervical back: Neck supple.  Neurological:     General: No focal deficit present.     Mental Status: He is alert.     ------------------------------------------------------------------------------------------------------------------------------------------------------------------------------------------------------------------- Assessment and Plan  Low testosterone He has been off testosterone for a while however is considering possibly trying again if his levels are low.  Checking testosterone, FSH/LH and prolactin levels.  Type 2 diabetes mellitus with microalbuminuria, with long-term current use of insulin (HCC) Lab Results  Component Value Date   HGBA1C 7.6 (H) 03/05/2022  Blood sugars seem to be gradually improving.  Continue to work on dietary change.  Recommend incorporation of regular exercise to help with his blood sugars as well as management of his anxiety.  Hypertension associated with diabetes (HCC) Blood pressures well controlled at this time.  Recommend continuation of current medications.  Social anxiety disorder Continues to have significant anxiety.  He is seeing psychiatry at this time however  his psychiatrist told him that he does not manage benzodiazepines.  I discussed with him that the goal will be to try and taper him back on this some as he continues to find a good baseline antianxiety medication that works well for him.  As well as regular therapy.  Excessive daytime sleepiness Stop bang is elevated with [place] that does not look too bad.  Recommend referral for sleep study due to his ongoing fatigue.   Meds ordered this encounter  Medications   tadalafil (CIALIS) 20 MG tablet    Sig: Take 0.5-1 tablets (10-20 mg total) by mouth every other day as needed for erectile dysfunction.    Dispense:  15 tablet    Refill:  2    No follow-ups on file.    This visit occurred during the SARS-CoV-2 public health emergency.  Safety protocols were in place, including screening questions prior to the visit, additional usage of staff PPE, and extensive cleaning of exam room while observing appropriate contact time as indicated for disinfecting solutions.

## 2022-05-19 NOTE — Patient Instructions (Signed)
Have labs drawn in 1 month, have them drawn when fasting and prior to 10am.

## 2022-05-19 NOTE — Assessment & Plan Note (Signed)
He has been off testosterone for a while however is considering possibly trying again if his levels are low.  Checking testosterone, FSH/LH and prolactin levels.

## 2022-05-26 ENCOUNTER — Ambulatory Visit (HOSPITAL_COMMUNITY): Payer: No Typology Code available for payment source | Admitting: Licensed Clinical Social Worker

## 2022-05-26 NOTE — Progress Notes (Signed)
Patient scheduled for assessment and did not show.

## 2022-06-11 ENCOUNTER — Other Ambulatory Visit (HOSPITAL_BASED_OUTPATIENT_CLINIC_OR_DEPARTMENT_OTHER): Payer: Self-pay | Admitting: Orthopaedic Surgery

## 2022-06-12 NOTE — Telephone Encounter (Signed)
I created an estimate for patient for his portion of the surgeon's fee. Patient will need to contact pre-service center for facility estimate. I left patient a voicemail stating this.

## 2022-06-23 ENCOUNTER — Encounter (HOSPITAL_COMMUNITY): Payer: Self-pay

## 2022-06-23 ENCOUNTER — Ambulatory Visit (HOSPITAL_COMMUNITY): Payer: No Typology Code available for payment source | Admitting: Psychiatry

## 2022-06-25 ENCOUNTER — Ambulatory Visit (INDEPENDENT_AMBULATORY_CARE_PROVIDER_SITE_OTHER): Payer: No Typology Code available for payment source | Admitting: Orthopaedic Surgery

## 2022-06-25 DIAGNOSIS — M7501 Adhesive capsulitis of right shoulder: Secondary | ICD-10-CM | POA: Diagnosis not present

## 2022-06-25 NOTE — Progress Notes (Signed)
Chief Complaint: right shoulder pain     History of Present Illness:   06/25/2022: Presents today for follow-up of his right shoulder.  At this time his surgery was denied by [employer].  He is endorsing a continued decrease in range of motion as well as increased pain in the right shoulder.  Albert [name] is a 51 y.o. 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  He has previously been seen by Dr. Roda Shutters and referred here for a glenohumeral injection.  He does have a history of diabetes with a recent A1c in the times.  He states that this has been going on since March when he had a fall at work.    Surgical History:   None  PMH/PSH/Family History/Social History/Meds/Allergies:    Past Medical History:  Diagnosis Date   ADHD    Anxiety    Depression    Diabetes mellitus without complication (HCC)    Dyslipidemia associated with type 2 diabetes mellitus ([facility]) 01/21/2017   Erectile dysfunction 08/04/2016   Insomnia 06/29/2016   Low libido 08/04/2016   Microalbuminuria    Shoulder dislocation 2008   Left shoulder   Social anxiety disorder    Tobacco use disorder    Past Surgical History:  Procedure Laterality Date   NO PAST SURGERIES     Social History   Socioeconomic History   Marital status: Significant Other    Spouse name: Not on file   Number of children: Not on file   Years of education: Not on file   Highest education level: Not on file  Occupational History   Not on file  Tobacco Use   Smoking status: Former    Packs/day: 1.00    Years: 25.00    Total pack years: 25.00    Types: Cigarettes    Quit date: 04/23/2017    Years since quitting: 5.1   Smokeless tobacco: Never  Vaping Use   Vaping Use: Every day   Substances: Nicotine  Substance and Sexual Activity   Alcohol use: No    Comment: Drank 6-10 beers. Last drank during Christmas   Drug use: No   Sexual activity: Yes     Partners: Female  Other Topics Concern   Not on file  Social History Narrative   Not on file   Social Determinants of Health   Financial Resource Strain: Not on file  Food Insecurity: Not on file  Transportation Needs: Not on file  Physical Activity: Not on file  Stress: Not on file  Social Connections: Not on file   Family History  Problem Relation Age of Onset   Hypertension Mother    Diabetes type II Mother    Hypertension Sister    Hypertension Maternal Grandmother    Diabetes type II Maternal Grandmother    Breast cancer Paternal Grandfather    Breast cancer Paternal Grandmother    Allergies  Allergen Reactions   Metformin And Related Nausea Only   Current Outpatient Medications  Medication Sig Dispense Refill   amLODipine (NORVASC) 10 MG tablet Take 1 tablet (10 mg total) by mouth daily. 90 tablet 2   aspirin EC 325 MG tablet Take 1 tablet (325 mg total) by mouth daily. 30 tablet 0   aspirin EC 81 MG tablet Take 1  tablet (81 mg total) by mouth daily. 90 tablet 3   atenolol (TENORMIN) 25 MG tablet TAKE 1 TABLET (25 MG TOTAL) BY MOUTH DAILY. 90 tablet 2   atorvastatin (LIPITOR) 10 MG tablet Take 1 tablet (10 mg total) by mouth daily. 90 tablet 2   busPIRone (BUSPAR) 15 MG tablet TAKE 1 TABLET BY MOUTH 3 TIMES DAILY. 270 tablet 1   clonazePAM (KLONOPIN) 2 MG tablet Take 1 tablet (2 mg total) by mouth 2 (two) times daily as needed for anxiety. 30 tabs must last 30 days. 30 tablet 4   Continuous Blood Gluc Sensor (FREESTYLE LIBRE 3 SENSOR) MISC Place 1 sensor on the skin every 14 days. Use to check glucose continuously 2 each 6   insulin detemir (LEVEMIR FLEXTOUCH) 100 UNIT/ML FlexPen Inject 44 Units into the skin every evening. 3 mL 3   Insulin Pen Needle (BD PEN NEEDLE NANO 2ND GEN) 32G X 4 MM MISC USE SUBCUTANEOUSLY DAILY WITH LEVEMIR 100 each 12   Insulin Pen Needle (PEN NEEDLES) 31G X 8 MM MISC Use to inject Victoza daily. 100 each PRN   oxycodone (OXY-IR) 5 MG capsule  Take 1 capsule (5 mg total) by mouth every 4 (four) hours as needed (severe pain). 20 capsule 0   tadalafil (CIALIS) 20 MG tablet Take 0.5-1 tablets (10-20 mg total) by mouth every other day as needed for erectile dysfunction. 15 tablet 2   testosterone (ANDROGEL) 50 MG/5GM (1%) GEL Place 5 g onto the skin daily. Apply to shoulders and upper arms 150 g 3   traMADol (ULTRAM) 50 MG tablet Take 1-2 tablets (50-100 mg total) by mouth daily as needed. 30 tablet 0   valsartan-hydrochlorothiazide (DIOVAN-HCT) 160-25 MG tablet Take 1 tablet by mouth daily. 90 tablet 3   VICTOZA 18 MG/3ML SOPN INJECT 0.6 MG UNDER THE SKIN ONCE DAILY 18 mL 0   Vilazodone HCl (VIIBRYD) 40 MG TABS TAKE 1 TABLET BY MOUTH EVERY DAY 30 tablet 3   No current facility-administered medications for this visit.   No results found.  Review of Systems:   A ROS was performed including pertinent positives and negatives as documented in the HPI.  Physical Exam :   Constitutional: NAD and appears stated age Neurological: Alert and oriented Psych: Appropriate affect and cooperative There were no vitals taken for this visit.   Comprehensive Musculoskeletal Exam:    Musculoskeletal Exam    Inspection Right Left  Skin No atrophy or winging No atrophy or winging  Palpation    Tenderness Glenohumeral None  Range of Motion    Flexion (passive) 100 170  Flexion (active) 90 170  Abduction 90 170  ER at the side 10 70  Can reach behind back to L5 T12  Strength     Full with pain none  Special Tests    Pseudoparalytic No No  Neurologic    Fires PIN, radial, median, ulnar, musculocutaneous, axillary, suprascapular, long thoracic, and spinal accessory innervated muscles. No abnormal sensibility  Vascular/Lymphatic    Radial Pulse 2+ 2+  Cervical Exam    Patient has symmetric cervical range of motion with negative Spurling's test.  Special Test:      Imaging:   Xray (3 views right shoulder): Normal  MRI right  shoulder: There is thickened inferior capsule consistent with adhesive capsulitis.  He does have some interstitial tearing involving the supraspinatus but no discrete tear  I personally reviewed and interpreted the radiographs.   Assessment:   51 y.o. male  right-hand-dominant male with right shoulder adhesive capsulitis.  At this time he has now failed multiple injections and physical therapy and does have persistent pain with limited overhead activity.  This is inhibiting his ability to work and perform most basic activities.  Side effect I did discuss with him shoulder debridement.  I did discuss that he has a small interstitial tear of the rotator cuff.  I did describe that unless this is more involved than his MRI suggest that I would not recommend any type of repair of this as this would inhibit his ability to rehab following surgery.  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  I did describe that aggressive postoperative rehab would be required in order to allow for early and improve range of motion.  Finally I did also discuss complications associated with arthroscopic debridement and manipulation such as a proximal humerus fracture.  He understands these and would like to proceed  Plan :    -Plan for right shoulder arthroscopy with extensive debridement, distal clavicle resection, manipulation under anesthesia     After a lengthy discussion of treatment options, including risks, benefits, alternatives, complications of surgical and nonsurgical conservative options, the patient elected surgical repair.   The patient  is aware of the material risks  and complications including, but not limited to injury to adjacent structures, neurovascular injury, infection, numbness, bleeding, implant failure, thermal burns, stiffness, persistent pain, failure to heal, disease transmission from  allograft, need for further surgery, dislocation, anesthetic risks, blood clots, risks of death,and others. The probabilities of surgical success and failure discussed with patient given their particular co-morbidities.The time and nature of expected rehabilitation and recovery was discussed.The patient's questions were all answered preoperatively.  No barriers to understanding were noted. I explained the natural history of the disease process and Rx rationale.  I explained to the patient what I considered to be reasonable expectations given their personal situation.  The final treatment plan was arrived at through a shared patient decision making process model.    I personally saw and evaluated the patient, and participated in the management and treatment plan.  Huel Cote, MD Attending Physician, Orthopedic Surgery  This document was dictated using Dragon voice recognition software. A reasonable attempt at proof reading has been made to minimize errors.

## 2022-07-07 ENCOUNTER — Other Ambulatory Visit (HOSPITAL_BASED_OUTPATIENT_CLINIC_OR_DEPARTMENT_OTHER): Payer: Self-pay | Admitting: Orthopaedic Surgery

## 2022-07-23 ENCOUNTER — Telehealth: Payer: Self-pay | Admitting: Orthopaedic Surgery

## 2022-07-23 NOTE — Telephone Encounter (Signed)
[place] forms received. To [employer]. 

## 2022-07-28 ENCOUNTER — Ambulatory Visit (HOSPITAL_BASED_OUTPATIENT_CLINIC_OR_DEPARTMENT_OTHER): Payer: Self-pay | Admitting: Orthopaedic Surgery

## 2022-07-28 DIAGNOSIS — M7501 Adhesive capsulitis of right shoulder: Secondary | ICD-10-CM

## 2022-07-28 NOTE — Progress Notes (Signed)
Surgical Instructions    Your procedure is scheduled on August 03, 2022.  Report to [facility] Main Entrance "A" at 10:15 A.M., then check in with the Admitting office.  Call this number if you have problems the morning of surgery:  [phone]   If you have any questions prior to your surgery date call [phone]: Open Monday-Friday 8am-4pm If you experience any cold or flu symptoms such as cough, fever, chills, shortness of breath, etc. between now and your scheduled surgery, please notify us at the above number     Remember:  Do not eat after midnight the night before your surgery  You may drink clear liquids until 09:15am the morning of your surgery.   Clear liquids allowed are: Water, Non-Citrus Juices (without pulp), Carbonated Beverages, Clear Tea, Black Coffee ONLY (NO MILK, CREAM OR POWDERED CREAMER of any kind), and Gatorade   Enhanced Recovery after Surgery for Orthopedics Enhanced Recovery after Surgery is a protocol used to improve the stress on your body and your recovery after surgery.  Patient Instructions   The day of surgery (if you have diabetes):  Drink ONE small 10 oz bottle of water/G2 Gatorade by 09:15am the morning of surgery This bottle was given to you during your hospital  pre-op appointment visit.  Nothing else to drink after completing the  Small 10 oz bottle of water/ G2 Gatorade.         If you have questions, please contact your surgeon's office.     Take these medicines the morning of surgery with A SIP OF WATER:  Amlodipine (Norvasc) Atenolol (Tenormin) Vilazodone HCL (Viibryd)  If needed: Clonazepam (Klonopin) Omeprazole (Prilosec)   As of today, STOP taking any Aspirin (unless otherwise instructed by your surgeon) Aleve, Naproxen, Ibuprofen, Motrin, Advil, Goody's, BC's, all herbal medications, fish oil, and all vitamins.  WHAT DO I DO ABOUT MY DIABETES MEDICATION?   THE NIGHT BEFORE SURGERY, take 22 units of Levemir insulin  (which is a half dose)  HOLD Victoza injection for 24 hours prior to surgery, Last dose will be Jan 20.     The day of surgery, do not take other diabetes injectables, including Byetta (exenatide), Bydureon (exenatide ER), Victoza (liraglutide), or Trulicity (dulaglutide).    HOW TO MANAGE YOUR DIABETES BEFORE AND AFTER SURGERY  Why is it important to control my blood sugar before and after surgery? Improving blood sugar levels before and after surgery helps healing and can limit problems. A way of improving blood sugar control is eating a healthy diet by:  Eating less sugar and carbohydrates  Increasing activity/exercise  Talking with your doctor about reaching your blood sugar goals High blood sugars (greater than 180 mg/dL) can raise your risk of infections and slow your recovery, so you will need to focus on controlling your diabetes during the weeks before surgery. Make sure that the doctor who takes care of your diabetes knows about your planned surgery including the date and location.  How do I manage my blood sugar before surgery? Check your blood sugar at least 4 times a day, starting 2 days before surgery, to make sure that the level is not too high or low.  Check your blood sugar the morning of your surgery when you wake up and every 2 hours until you get to the Short Stay unit.  If your blood sugar is less than 70 mg/dL, you will need to treat for low blood sugar: Do not take insulin. Treat a low blood sugar (  less than 70 mg/dL) with  cup of clear juice (cranberry or apple), 4 glucose tablets, OR glucose gel. Recheck blood sugar in 15 minutes after treatment (to make sure it is greater than 70 mg/dL). If your blood sugar is not greater than 70 mg/dL on recheck, call [phone] for further instructions. Report your blood sugar to the short stay nurse when you get to Short Stay.  If you are admitted to the hospital after surgery: Your blood sugar will be checked by the  staff and you will probably be given insulin after surgery (instead of oral diabetes medicines) to make sure you have good blood sugar levels. The goal for blood sugar control after surgery is 80-180 mg/dL.            Do not wear jewelry. Do not wear lotions, powders, perfumes/cologne or deodorant. Do not shave 48 hours prior to surgery.  Men may shave face and neck. Do not bring valuables to the hospital.   [facility] is not responsible for any belongings or valuables.    Do NOT Smoke (Tobacco/Vaping)  24 hours prior to your procedure  If you use a CPAP at night, you may bring your mask for your overnight stay.   Contacts, glasses, hearing aids, dentures or partials may not be worn into surgery, please bring cases for these belongings   For patients admitted to the hospital, discharge time will be determined by your treatment team.   Patients discharged the day of surgery will not be allowed to drive home, and someone needs to stay with them for 24 hours.   SURGICAL WAITING ROOM VISITATION Patients having surgery or a procedure may have no more than 2 support people in the waiting area - these visitors may rotate.   Children under the age of 63 must have an adult with them who is not the patient. If the patient needs to stay at the hospital during part of their recovery, the visitor guidelines for inpatient rooms apply. Pre-op nurse will coordinate an appropriate time for 1 support person to accompany patient in pre-op.  This support person may not rotate.   Please refer to [email or web address] for the visitor guidelines for Inpatients (after your surgery is over and you are in a regular room).    Special instructions:    Oral Hygiene is also important to reduce your risk of infection.  Remember - BRUSH YOUR TEETH THE MORNING OF SURGERY WITH YOUR REGULAR TOOTHPASTE   [name] For Surgery  Before surgery, you can  play an important role. Because skin is not sterile, your skin needs to be as free of germs as possible. You can reduce the number of germs on your skin by washing with CHG (chlorahexidine gluconate) Soap before surgery.  CHG is an antiseptic cleaner which kills germs and bonds with the skin to continue killing germs even after washing.     Please do not use if you have an allergy to CHG or antibacterial soaps. If your skin becomes reddened/irritated stop using the CHG.  Do not shave (including legs and underarms) for at least 48 hours prior to first CHG shower. It is OK to shave your face.  Please follow these instructions carefully.     Shower the NIGHT BEFORE SURGERY and the MORNING OF SURGERY with CHG Soap.   If you chose to wash your hair, wash your hair first as usual with your normal shampoo. After you shampoo, rinse your hair and body thoroughly to remove  the shampoo.  Then [employer] and genitals (private parts) with your normal soap and rinse thoroughly to remove soap.  After that Use CHG Soap as you would any other liquid soap. You can apply CHG directly to the skin and wash gently with a scrungie or a clean washcloth.   Apply the CHG Soap to your body ONLY FROM THE NECK DOWN.  Do not use on open wounds or open sores. Avoid contact with your eyes, ears, mouth and genitals (private parts). Wash Face and genitals (private parts)  with your normal soap.   Wash thoroughly, paying special attention to the area where your surgery will be performed.  Thoroughly rinse your body with warm water from the neck down.  DO NOT shower/wash with your normal soap after using and rinsing off the CHG Soap.  Pat yourself dry with a CLEAN TOWEL.  Wear CLEAN PAJAMAS to bed the night before surgery  Place CLEAN SHEETS on your bed the night before your surgery  DO NOT SLEEP WITH PETS.   Day of Surgery:  Take a shower with CHG soap. Wear Clean/Comfortable clothing the morning of surgery Do not  apply any deodorants/lotions.   Remember to brush your teeth WITH YOUR REGULAR TOOTHPASTE.    If you received a COVID test during your pre-op visit, it is requested that you wear a mask when out in public, stay away from anyone that may not be feeling well, and notify your surgeon if you develop symptoms. If you have been in contact with anyone that has tested positive in the last 10 days, please notify your surgeon.    Please read over the following fact sheets that you were given.

## 2022-07-29 ENCOUNTER — Other Ambulatory Visit: Payer: Self-pay

## 2022-07-29 ENCOUNTER — Encounter (HOSPITAL_COMMUNITY)
Admission: RE | Admit: 2022-07-29 | Discharge: 2022-07-29 | Disposition: A | Discharge status: Home / Self Care | Payer: BLUE CROSS/BLUE SHIELD | Source: Ambulatory Visit | Attending: Orthopaedic Surgery | Admitting: Orthopaedic Surgery

## 2022-07-29 ENCOUNTER — Encounter (HOSPITAL_COMMUNITY): Payer: Self-pay

## 2022-07-29 VITALS — BP 116/83 | HR 94 | Temp 98.1°F | Resp 17 | Ht 72.0 in | Wt 237.0 lb

## 2022-07-29 DIAGNOSIS — R809 Proteinuria, unspecified: Secondary | ICD-10-CM | POA: Diagnosis not present

## 2022-07-29 DIAGNOSIS — Z794 Long term (current) use of insulin: Secondary | ICD-10-CM | POA: Insufficient documentation

## 2022-07-29 DIAGNOSIS — E1129 Type 2 diabetes mellitus with other diabetic kidney complication: Secondary | ICD-10-CM | POA: Diagnosis not present

## 2022-07-29 DIAGNOSIS — Z01818 Encounter for other preprocedural examination: Secondary | ICD-10-CM | POA: Diagnosis not present

## 2022-07-29 HISTORY — DX: Essential (primary) hypertension: I10

## 2022-07-29 HISTORY — DX: Respiratory tuberculosis unspecified: A15.9

## 2022-07-29 HISTORY — DX: Gastro-esophageal reflux disease without esophagitis: K21.9

## 2022-07-29 LAB — CBC
HCT: 49.1 % (ref 39.0–52.0)
Hemoglobin: 17.4 g/dL — ABNORMAL HIGH (ref 13.0–17.0)
MCH: 33.3 pg (ref 26.0–34.0)
MCHC: 35.4 g/dL (ref 30.0–36.0)
MCV: 94.1 fL (ref 80.0–100.0)
Platelets: 189 10*3/uL (ref 150–400)
RBC: 5.22 MIL/uL (ref 4.22–5.81)
RDW: 12.5 % (ref 11.5–15.5)
WBC: 10.6 10*3/uL — ABNORMAL HIGH (ref 4.0–10.5)
nRBC: 0 % (ref 0.0–0.2)

## 2022-07-29 LAB — BASIC METABOLIC PANEL
Anion gap: 11 (ref 5–15)
BUN: 11 mg/dL (ref 6–20)
CO2: 26 mmol/L (ref 22–32)
Calcium: 9 mg/dL (ref 8.9–10.3)
Chloride: 96 mmol/L — ABNORMAL LOW (ref 98–111)
Creatinine, Ser: 0.96 mg/dL (ref 0.61–1.24)
GFR, Estimated: >60 mL/min (ref >60)
Glucose, Bld: 291 mg/dL — ABNORMAL HIGH (ref 70–99)
Potassium: 4.1 mmol/L (ref 3.5–5.1)
Sodium: 133 mmol/L — ABNORMAL LOW (ref 135–145)

## 2022-07-29 LAB — HEMOGLOBIN A1C
Hgb A1c MFr Bld: 12.6 % — ABNORMAL HIGH (ref 4.8–5.6)
Mean Plasma Glucose: 314.92 mg/dL

## 2022-07-29 LAB — GLUCOSE, CAPILLARY: Glucose-Capillary: 337 mg/dL — ABNORMAL HIGH (ref 70–99)

## 2022-07-29 NOTE — Progress Notes (Signed)
Notified dr. Sammuel Hines of A1C result of 12.6.

## 2022-07-29 NOTE — Progress Notes (Addendum)
PCP - [facility] [employer] - denies  PPM/ICD - denies   Chest x-ray - n/a EKG - 07/29/22 Stress Test - denies ECHO - denies Cardiac Cath - denies  Sleep Study - denies   Fasting Blood Sugar - 150-250 Checks Blood Sugar occasionally (only checks when he feels bad)   States he had chicken strips and lots of wine and beer last night, CBG 337.   As of today, STOP taking any Aspirin (unless otherwise instructed by your surgeon) Aleve, Naproxen, Ibuprofen, Motrin, Advil, Goody's, BC's, all herbal medications, fish oil, and all vitamins.   ERAS Protcol -yes PRE-SURGERY Ensure or G2- ensure ordered and given  COVID TEST- not needed   Anesthesia review: yes, A1c 12.6  Patient denies shortness of breath, fever, cough and chest pain at PAT appointment   All instructions explained to the patient, with a verbal understanding of the material. Patient agrees to go over the instructions while at home for a better understanding. Patient also instructed to self quarantine after being tested for COVID-19. The opportunity to ask questions was provided.

## 2022-07-30 ENCOUNTER — Telehealth: Payer: Self-pay | Admitting: Orthopaedic Surgery

## 2022-07-30 ENCOUNTER — Encounter (HOSPITAL_BASED_OUTPATIENT_CLINIC_OR_DEPARTMENT_OTHER): Payer: Self-pay | Admitting: Orthopaedic Surgery

## 2022-07-30 ENCOUNTER — Encounter (HOSPITAL_COMMUNITY): Payer: Self-pay | Admitting: Certified Registered"

## 2022-07-30 NOTE — Telephone Encounter (Signed)
Okay to keep OOW until surgery?  Per last communication/work note in chart, patient should be excused from work until surgery is scheduled, including this post pone time period, as well as 6 weeks post op.

## 2022-07-30 NOTE — Telephone Encounter (Signed)
-----  Message from Vanetta Mulders, MD sent at 07/30/2022  7:07 AM EST ----- Regarding: FW: A1C result April, can you reach out to Mr. Cookston, Ideally I'd like his A1C below 10 before we proceed with surgery as it make it makes it way less likely to be successful if his DM is flaired like this.  ----- Message ----- From: Eula Listen, RN Sent: 07/29/2022   4:03 PM EST To: Vanetta Mulders, MD Subject: A1C result                                     A1C result at PAT appt was 12.6.

## 2022-07-30 NOTE — Telephone Encounter (Signed)
I spoke with patient, and advised surgery to be postponed until A1C reaches below 10. Patient to contact us once this occurs to reschedule surgery.

## 2022-07-30 NOTE — Telephone Encounter (Signed)
I spoke with patient in reference to his A1C level being 12.6. Patient advised surgery will have to be postponed until A1C comes down below 10. Patient has been referred back to his PCP to be advised when A1C can be redrawn, and to help patient get level down. Patient will contact us once level is below 10 to be rescheduled for surgery.     In the interm patient is concerned about FMLA paperwork being filled out. Patient wanted me to reach out to doctor to request that his FLMA form still be completed. Not sure how doctor wants to proceed with leave dates since surgery has been postponed. [employer] does has patient's forms. Please advise.

## 2022-08-03 ENCOUNTER — Telehealth: Payer: Self-pay | Admitting: Orthopaedic Surgery

## 2022-08-03 ENCOUNTER — Ambulatory Visit: Payer: BLUE CROSS/BLUE SHIELD | Admitting: Rehabilitative and Restorative Service Providers"

## 2022-08-03 ENCOUNTER — Ambulatory Visit (HOSPITAL_BASED_OUTPATIENT_CLINIC_OR_DEPARTMENT_OTHER)
Admission: RE | Admit: 2022-08-03 | Payer: BLUE CROSS/BLUE SHIELD | Source: Home / Self Care | Admitting: Orthopaedic Surgery

## 2022-08-03 SURGERY — SHOULDER ARTHROSCOPY WITH DISTAL CLAVICLE RESECTION
Anesthesia: Regional | Site: Shoulder | Laterality: Right

## 2022-08-03 NOTE — Telephone Encounter (Signed)
[place] forms received. To [employer]. 

## 2022-08-04 ENCOUNTER — Encounter: Payer: BLUE CROSS/BLUE SHIELD | Admitting: Rehabilitative and Restorative Service Providers"

## 2022-08-05 ENCOUNTER — Encounter: Payer: BLUE CROSS/BLUE SHIELD | Admitting: Rehabilitative and Restorative Service Providers"

## 2022-08-06 ENCOUNTER — Encounter: Payer: BLUE CROSS/BLUE SHIELD | Admitting: Rehabilitative and Restorative Service Providers"

## 2022-08-08 ENCOUNTER — Other Ambulatory Visit: Payer: Self-pay | Admitting: Family Medicine

## 2022-08-08 DIAGNOSIS — F4010 Social phobia, unspecified: Secondary | ICD-10-CM

## 2022-08-08 DIAGNOSIS — F4001 Agoraphobia with panic disorder: Secondary | ICD-10-CM

## 2022-08-13 ENCOUNTER — Ambulatory Visit (HOSPITAL_BASED_OUTPATIENT_CLINIC_OR_DEPARTMENT_OTHER): Payer: BLUE CROSS/BLUE SHIELD | Admitting: Orthopaedic Surgery

## 2022-08-13 DIAGNOSIS — M7501 Adhesive capsulitis of right shoulder: Secondary | ICD-10-CM

## 2022-08-13 NOTE — Progress Notes (Signed)
Chief Complaint: right shoulder pain     History of Present Illness:   08/13/2022: Presents today at my request as his most recent A1c was over 12.  He is working on getting this back down.  Albert [name] is a 52 y.o. 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  He has previously been seen by Dr. Erlinda Hong and referred here for a glenohumeral injection.  He does have a history of diabetes with a recent A1c in the times.  He states that this has been going on since March when he had a fall at work.    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   Past Surgical History:  Procedure Laterality Date   NO PAST SURGERIES     Social History   Socioeconomic History   Marital status: Significant Other    Spouse name: Not on file   Number of children: Not on file   Years of education: Not on file   Highest education level: Not on file  Occupational History   Not on file  Tobacco Use   Smoking status: Some Days    Packs/day: 0.25    Years: 25.00    Total pack years: 6.25    Types: Cigarettes    Last attempt to quit: 04/23/2017    Years since quitting: 5.3   Smokeless tobacco: Never  Vaping Use   Vaping Use: Every day   Substances: Nicotine  Substance and Sexual Activity   Alcohol use: Yes    Comment: 6 a week   Drug use: No   Sexual activity: Yes    Partners: Female   Other Topics Concern   Not on file  Social History Narrative   Not on file   Social Determinants of Health   Financial Resource Strain: Not on file  Food Insecurity: Not on file  Transportation Needs: Not on file  Physical Activity: Not on file  Stress: Not on file  Social Connections: Not on file   Family History  Problem Relation Age of Onset   Hypertension Mother    Diabetes type II Mother    Hypertension Sister    Hypertension Maternal Grandmother    Diabetes type II Maternal Grandmother    Breast cancer Paternal Grandfather    Breast cancer Paternal Grandmother    Allergies  Allergen Reactions   Metformin And Related Nausea Only   Current Outpatient Medications  Medication Sig Dispense Refill   acetaminophen ([employer] ACETAMINOPHEN EX ST) 500 MG tablet TAKE 1 TABLET BY MOUTH EVERY 8 HOURS FOR 10 DAYS. (Patient not taking: Reported on 07/27/2022) 30 tablet 2   amLODipine (NORVASC) 10 MG tablet Take 1  tablet (10 mg total) by mouth daily. 90 tablet 2   aspirin EC 325 MG tablet Take 1 tablet (325 mg total) by mouth daily. 30 tablet 0   aspirin EC 81 MG tablet Take 1 tablet (81 mg total) by mouth daily. 90 tablet 3   atenolol (TENORMIN) 25 MG tablet TAKE 1 TABLET (25 MG TOTAL) BY MOUTH DAILY. 90 tablet 2   atorvastatin (LIPITOR) 10 MG tablet Take 1 tablet (10 mg total) by mouth daily. 90 tablet 2   busPIRone (BUSPAR) 15 MG tablet TAKE 1 TABLET BY MOUTH 3 TIMES DAILY. (Patient not taking: Reported on 07/27/2022) 270 tablet 1   calcium carbonate (TUMS - DOSED IN MG ELEMENTAL CALCIUM) 500 MG chewable tablet Chew 2 tablets by mouth daily as needed for indigestion or heartburn.     clonazePAM (KLONOPIN) 2 MG tablet Take 1 tablet (2 mg total) by mouth 2 (two) times daily as needed for anxiety. 30 tabs must last 30 days. 30 tablet 4   Continuous Blood Gluc Sensor (FREESTYLE LIBRE 3 SENSOR) MISC Place 1 sensor on the skin every 14 days. Use to check glucose continuously 2 each 6    Cyanocobalamin (B-12 PO) Take 1 capsule by mouth daily.     insulin detemir (LEVEMIR FLEXTOUCH) 100 UNIT/ML FlexPen Inject 44 Units into the skin every evening. 3 mL 3   Insulin Pen Needle (BD PEN NEEDLE NANO 2ND GEN) 32G X 4 MM MISC USE SUBCUTANEOUSLY DAILY WITH LEVEMIR 100 each 12   Insulin Pen Needle (PEN NEEDLES) 31G X 8 MM MISC Use to inject Victoza daily. 100 each PRN   omeprazole (PRILOSEC OTC) 20 MG tablet Take 20 mg by mouth daily as needed (acid reflux).     oxycodone (OXY-IR) 5 MG capsule Take 1 capsule (5 mg total) by mouth every 4 (four) hours as needed (severe pain). 20 capsule 0   valsartan-hydrochlorothiazide (DIOVAN-HCT) 160-25 MG tablet Take 1 tablet by mouth daily. 90 tablet 3   VICTOZA 18 MG/3ML SOPN INJECT 0.6 MG UNDER THE SKIN ONCE DAILY 18 mL 0   Vilazodone HCl (VIIBRYD) 40 MG TABS TAKE 1 TABLET BY MOUTH EVERY DAY 30 tablet 3   VITAMIN D PO Take 1 capsule by mouth daily.     No current facility-administered medications for this visit.   No results found.  Review of Systems:   A ROS was performed including pertinent positives and negatives as documented in the HPI.  Physical Exam :   Constitutional: NAD and appears stated age Neurological: Alert and oriented Psych: Appropriate affect and cooperative There were no vitals taken for this visit.   Comprehensive Musculoskeletal Exam:    Musculoskeletal Exam    Inspection Right Left  Skin No atrophy or winging No atrophy or winging  Palpation    Tenderness Glenohumeral None  Range of Motion    Flexion (passive) 100 170  Flexion (active) 90 170  Abduction 90 170  ER at the side 10 70  Can reach behind back to L5 T12  Strength     Full with pain none  Special Tests    Pseudoparalytic No No  Neurologic    Fires PIN, radial, median, ulnar, musculocutaneous, axillary, suprascapular, long thoracic, and spinal accessory innervated muscles. No abnormal sensibility  Vascular/Lymphatic    Radial Pulse 2+ 2+   Cervical Exam    Patient has symmetric cervical range of motion with negative Spurling's test.  Special Test:      Imaging:   Xray (  3 views right shoulder): Normal  MRI right shoulder: There is thickened inferior capsule consistent with adhesive capsulitis.  He does have some interstitial tearing involving the supraspinatus but no discrete tear  I personally reviewed and interpreted the radiographs.   Assessment:   52 y.o. male right-hand-dominant male with right shoulder adhesive capsulitis.  At this time he has now failed multiple injections and physical therapy and does have persistent pain with limited overhead activity.  This is inhibiting his ability to work and perform most basic activities.  Side effect I did discuss with him shoulder debridement.  I did discuss that he has a small interstitial tear of the rotator cuff.  I did describe that unless this is more involved than his MRI suggest that I would not recommend any type of repair of this as this would inhibit his ability to rehab following surgery.  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  I did describe that aggressive postoperative rehab would be required in order to allow for early and improve range of motion.  Finally I did also discuss complications associated with arthroscopic debridement and manipulation such as a proximal humerus fracture.  He understands these and would like to proceed.  Overall I did describe that currently his A1c is quite high and affect I do believe he would benefit from lowered A1c prior to surgical invention.  We did discuss that a goal in the sevens or eights would be more appropriate so as to limit the risk of refreezing.  Plan :    -Plan for right shoulder arthroscopy with extensive debridement, distal clavicle resection, manipulation under anesthesia     After a lengthy  discussion of treatment options, including risks, benefits, alternatives, complications of surgical and nonsurgical conservative options, the patient elected surgical repair.   The patient  is aware of the material risks  and complications including, but not limited to injury to adjacent structures, neurovascular injury, infection, numbness, bleeding, implant failure, thermal burns, stiffness, persistent pain, failure to heal, disease transmission from allograft, need for further surgery, dislocation, anesthetic risks, blood clots, risks of death,and others. The probabilities of surgical success and failure discussed with patient given their particular co-morbidities.The time and nature of expected rehabilitation and recovery was discussed.The patient's questions were all answered preoperatively.  No barriers to understanding were noted. I explained the natural history of the disease process and Rx rationale.  I explained to the patient what I considered to be reasonable expectations given their personal situation.  The final treatment plan was arrived at through a shared patient decision making process model.    I personally saw and evaluated the patient, and participated in the management and treatment plan.  Vanetta Mulders, MD Attending Physician, Orthopedic Surgery  This document was dictated using Dragon voice recognition software. A reasonable attempt at proof reading has been made to minimize errors.

## 2022-08-14 ENCOUNTER — Encounter (HOSPITAL_BASED_OUTPATIENT_CLINIC_OR_DEPARTMENT_OTHER): Payer: No Typology Code available for payment source | Admitting: Orthopaedic Surgery

## 2022-08-14 ENCOUNTER — Encounter (HOSPITAL_BASED_OUTPATIENT_CLINIC_OR_DEPARTMENT_OTHER): Payer: Self-pay | Admitting: Orthopaedic Surgery

## 2022-08-31 ENCOUNTER — Other Ambulatory Visit: Payer: Self-pay | Admitting: Family Medicine

## 2022-08-31 DIAGNOSIS — Z794 Long term (current) use of insulin: Secondary | ICD-10-CM

## 2022-09-05 ENCOUNTER — Other Ambulatory Visit: Payer: Self-pay | Admitting: Family Medicine

## 2022-09-07 ENCOUNTER — Other Ambulatory Visit: Payer: Self-pay | Admitting: Family Medicine

## 2022-09-08 ENCOUNTER — Encounter: Payer: Self-pay | Admitting: Family Medicine

## 2022-09-08 DIAGNOSIS — E1129 Type 2 diabetes mellitus with other diabetic kidney complication: Secondary | ICD-10-CM

## 2022-09-08 NOTE — Telephone Encounter (Signed)
Completed.   Thanks!  CM

## 2022-09-10 ENCOUNTER — Telehealth: Payer: Self-pay | Admitting: Orthopaedic Surgery

## 2022-09-10 ENCOUNTER — Encounter (HOSPITAL_BASED_OUTPATIENT_CLINIC_OR_DEPARTMENT_OTHER): Payer: Self-pay | Admitting: Orthopaedic Surgery

## 2022-09-10 ENCOUNTER — Other Ambulatory Visit: Payer: Self-pay | Admitting: Family Medicine

## 2022-09-10 DIAGNOSIS — M7501 Adhesive capsulitis of right shoulder: Secondary | ICD-10-CM | POA: Diagnosis not present

## 2022-09-10 NOTE — Telephone Encounter (Signed)
Patient asking where he needs to get his blood work done that Dr. Sammuel Hines wants done. Please advise.

## 2022-09-10 NOTE — Telephone Encounter (Signed)
[facility] via [employer]

## 2022-09-16 ENCOUNTER — Encounter: Payer: Self-pay | Admitting: Family Medicine

## 2022-09-16 MED ORDER — LANTUS SOLOSTAR 100 UNIT/ML ~~LOC~~ SOPN: 45.0000 [IU] | PEN_INJECTOR | Freq: Two times a day (BID) | SUBCUTANEOUS | 1 refills | Status: DC

## 2022-09-22 ENCOUNTER — Encounter (HOSPITAL_BASED_OUTPATIENT_CLINIC_OR_DEPARTMENT_OTHER): Payer: Self-pay | Admitting: Orthopaedic Surgery

## 2022-09-23 ENCOUNTER — Telehealth: Payer: Self-pay | Admitting: Orthopaedic Surgery

## 2022-09-23 NOTE — Telephone Encounter (Signed)
[place] forms received. To [employer]. 

## 2022-10-06 ENCOUNTER — Encounter: Payer: Self-pay | Admitting: Family Medicine

## 2022-10-06 ENCOUNTER — Other Ambulatory Visit: Payer: Self-pay | Admitting: Family Medicine

## 2022-10-07 NOTE — Telephone Encounter (Signed)
Pls schedule patients 6 month follow-up in April.

## 2022-10-08 NOTE — Telephone Encounter (Signed)
Patient is scheduled for 11/05/2022 at 9:30, thanks.

## 2022-10-19 ENCOUNTER — Encounter (HOSPITAL_BASED_OUTPATIENT_CLINIC_OR_DEPARTMENT_OTHER): Payer: Self-pay | Admitting: Orthopaedic Surgery

## 2022-10-27 ENCOUNTER — Telehealth: Payer: Self-pay | Admitting: Orthopaedic Surgery

## 2022-10-27 NOTE — Telephone Encounter (Signed)
Received call from patient. He stated [facility] did not received the fax sent on 4/8. I advised patient I would re-fax, which I did to [phone].

## 2022-11-05 ENCOUNTER — Encounter: Payer: Self-pay | Admitting: Family Medicine

## 2022-11-05 ENCOUNTER — Ambulatory Visit: Payer: BLUE CROSS/BLUE SHIELD | Admitting: Family Medicine

## 2022-11-05 VITALS — BP 106/73 | HR 85 | Ht 72.0 in | Wt 241.0 lb

## 2022-11-05 DIAGNOSIS — E1159 Type 2 diabetes mellitus with other circulatory complications: Secondary | ICD-10-CM | POA: Diagnosis not present

## 2022-11-05 DIAGNOSIS — E1169 Type 2 diabetes mellitus with other specified complication: Secondary | ICD-10-CM

## 2022-11-05 DIAGNOSIS — Z794 Long term (current) use of insulin: Secondary | ICD-10-CM

## 2022-11-05 DIAGNOSIS — I152 Hypertension secondary to endocrine disorders: Secondary | ICD-10-CM

## 2022-11-05 DIAGNOSIS — E785 Hyperlipidemia, unspecified: Secondary | ICD-10-CM

## 2022-11-05 DIAGNOSIS — E1129 Type 2 diabetes mellitus with other diabetic kidney complication: Secondary | ICD-10-CM | POA: Diagnosis not present

## 2022-11-05 DIAGNOSIS — R809 Proteinuria, unspecified: Secondary | ICD-10-CM | POA: Diagnosis not present

## 2022-11-05 LAB — POCT GLYCOSYLATED HEMOGLOBIN (HGB A1C): HbA1c, POC (controlled diabetic range): 8 % — AB (ref 0.0–7.0)

## 2022-11-05 MED ORDER — TRAZODONE HCL 50 MG PO TABS: 50.0000 mg | ORAL_TABLET | Freq: Every evening | ORAL | 3 refills | Status: DC | PRN

## 2022-11-05 NOTE — Patient Instructions (Addendum)
You can try valerian root initially for sleep/anxiety. If this is not effective you may try trazodone.  Let me know if this is not helpful.   Increase morning dose of lantus to 50 units.  Keep evening dose at 45 units.   See me again in 4 months.

## 2022-11-05 NOTE — Assessment & Plan Note (Signed)
BP is well controlled.  Continue current medications for management of HTN.   

## 2022-11-05 NOTE — Progress Notes (Signed)
Albert [name] - 52 y.o. male MRN [id number]  Date of birth: 1971-01-21  Subjective Chief Complaint  Patient presents with   Diabetes    HPI Albert [name] is a 52 y.o. male here today for follow up visit.   He reports that he is having difficulty sleeping due to pain in his shoulder. Planning on having surgery but waiting for blood glucose to be better controlled.   He continues on valsartan/hctz, atenolol and amlodipine for management of HTN.  BP is well controlled  at this time.  He reports that he is tolerating this well.  Denies symptoms of chest pain, shortness of breath, palpitations, headache or vision changes.  Remains on lantus 45 units twice per day.  A1c improved to 8.0%.  He denies hypoglycemia.  He is not very active and diet is not consistent.    He is taking viibryd daily with clonazepam as needed for severe anxiety/panic. Has difficulty falling asleep.   ROS:  A comprehensive ROS was completed and negative except as noted per HPI  Allergies  Allergen Reactions   Metformin And Related Nausea Only    Past Medical History:  Diagnosis Date   ADHD    Anxiety    Depression    Diabetes mellitus without complication    type 2   Dyslipidemia associated with type 2 diabetes mellitus 01/21/2017   Erectile dysfunction 08/04/2016   GERD (gastroesophageal reflux disease)    Hypertension    Insomnia 06/29/2016   Low libido 08/04/2016   Microalbuminuria    Shoulder dislocation 2008   Left shoulder   Social anxiety disorder    Tobacco use disorder    Tuberculosis    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    Past Surgical History:  Procedure Laterality Date   NO PAST SURGERIES      Social History   Socioeconomic History   Marital status: Significant Other    Spouse name: Not on file   Number of children: Not on file   Years of education: Not on file   Highest education level: Some college, no degree  Occupational History   Not on file  Tobacco Use   Smoking  status: Some Days    Packs/day: 0.25    Years: 25.00    Additional pack years: 0.00    Total pack years: 6.25    Types: Cigarettes    Last attempt to quit: 04/23/2017    Years since quitting: 5.5   Smokeless tobacco: Never  Vaping Use   Vaping Use: Every day   Substances: Nicotine  Substance and Sexual Activity   Alcohol use: Yes    Comment: 6 a week   Drug use: No   Sexual activity: Yes    Partners: Female  Other Topics Concern   Not on file  Social History Narrative   Not on file   Social Determinants of Health   Financial Resource Strain: High Risk (11/04/2022)   Overall Financial Resource Strain (CARDIA)    Difficulty of Paying Living Expenses: Hard  Food Insecurity: No Food Insecurity (11/04/2022)   Hunger Vital Sign    Worried About Running Out of Food in the Last Year: Never true    Ran Out of Food in the Last Year: Never true  Transportation Needs: No Transportation Needs (11/04/2022)   PRAPARE - Administrator, Civil Service (Medical): No    Lack of Transportation (Non-Medical): No  Physical Activity: Insufficiently Active (11/04/2022)   Exercise Vital Sign  Days of Exercise per Week: 1 day    Minutes of Exercise per Session: 60 min  Stress: Stress Concern Present (11/04/2022)   [employer] of Occupational Health - Occupational Stress Questionnaire    Feeling of Stress : Very much  Social Connections: Socially Isolated (11/04/2022)   Social Connection and Isolation Panel [NHANES]    Frequency of Communication with Friends and Family: Twice a week    Frequency of Social Gatherings with Friends and Family: Never    Attends Religious Services: Never    Database administrator or Organizations: No    Attends Engineer, structural: Not on file    Marital Status: Never married    Family History  Problem Relation Age of Onset   Hypertension Mother    Diabetes type II Mother    Hypertension Sister    Hypertension Maternal Grandmother     Diabetes type II Maternal Grandmother    Breast cancer Paternal Grandfather    Breast cancer Paternal Grandmother     Health Maintenance  Topic Date Due   Diabetic kidney evaluation - Urine ACR  01/12/2018   COLONOSCOPY (Pts 45-47yrs Insurance coverage will need to be confirmed)  11/07/2022 (Originally 03/27/2017)   Hepatitis C Screening  11/07/2022 (Originally 03/27/1990)   HIV Screening  11/07/2022 (Originally 03/28/1987)   COVID-19 Vaccine (3 - 2023-24 season) 04/23/2023 (Originally 03/13/2022)   Zoster Vaccines- Shingrix (1 of 2) 02/04/2024 (Originally 03/27/2022)   HEMOGLOBIN A1C  05/07/2023   FOOT EXAM  05/20/2023   OPHTHALMOLOGY EXAM  05/20/2023   Diabetic kidney evaluation - eGFR measurement  07/30/2023   DTaP/Tdap/Td (2 - Td or Tdap) 12/05/2028   HPV VACCINES  Aged Out   INFLUENZA VACCINE  Discontinued     ----------------------------------------------------------------------------------------------------------------------------------------------------------------------------------------------------------------- Physical Exam BP 106/73 (BP Location: Left Arm, Patient Position: Sitting, Cuff Size: Normal)   Pulse 85   Ht 6' (1.829 m)   Wt 241 lb (109.3 kg)   SpO2 97%   BMI 32.69 kg/m   Physical Exam Constitutional:      Appearance: Normal appearance.  HENT:     Head: Normocephalic and atraumatic.  Eyes:     General: No scleral icterus. Cardiovascular:     Rate and Rhythm: Normal rate and regular rhythm.  Pulmonary:     Effort: Pulmonary effort is normal.     Breath sounds: Normal breath sounds.  Musculoskeletal:     Cervical back: Neck supple.  Neurological:     Mental Status: He is alert.  Psychiatric:        Mood and Affect: Mood normal.        Behavior: Behavior normal.      ------------------------------------------------------------------------------------------------------------------------------------------------------------------------------------------------------------------- Assessment and Plan  Hypertension associated with diabetes (HCC) BP is well controlled.  Continue current medications for management of HTN.   Type 2 diabetes mellitus with microalbuminuria, with long-term current use of insulin (HCC) Diabetes control has improved.  Continue lantus with increase of morning dose of 50 units and 45 units at bedtime.  Encouraged increased activity and dietary changes.   Dyslipidemia associated with type 2 diabetes mellitus (HCC) Continue atorvastatin at current strength.    Meds ordered this encounter  Medications   traZODone (DESYREL) 50 MG tablet    Sig: Take 1-2 tablets (50-100 mg total) by mouth at bedtime as needed for sleep.    Dispense:  30 tablet    Refill:  3    Return in about 4 months (around 03/07/2023) for T2DM.  This visit occurred during the SARS-CoV-2 public health emergency.  Safety protocols were in place, including screening questions prior to the visit, additional usage of staff PPE, and extensive cleaning of exam room while observing appropriate contact time as indicated for disinfecting solutions.

## 2022-11-05 NOTE — Assessment & Plan Note (Signed)
Diabetes control has improved.  Continue lantus with increase of morning dose of 50 units and 45 units at bedtime.  Encouraged increased activity and dietary changes.

## 2022-11-05 NOTE — Assessment & Plan Note (Signed)
Continue atorvastatin at current strength.  

## 2022-11-11 ENCOUNTER — Encounter (HOSPITAL_BASED_OUTPATIENT_CLINIC_OR_DEPARTMENT_OTHER): Payer: Self-pay | Admitting: Orthopaedic Surgery

## 2022-11-11 ENCOUNTER — Ambulatory Visit (HOSPITAL_BASED_OUTPATIENT_CLINIC_OR_DEPARTMENT_OTHER): Payer: BLUE CROSS/BLUE SHIELD | Admitting: Orthopaedic Surgery

## 2022-11-11 DIAGNOSIS — M7501 Adhesive capsulitis of right shoulder: Secondary | ICD-10-CM | POA: Diagnosis not present

## 2022-11-11 MED ORDER — ASPIRIN 325 MG PO TBEC: 325.0000 mg | DELAYED_RELEASE_TABLET | Freq: Every day | ORAL | 0 refills | Status: AC

## 2022-11-11 MED ORDER — OXYCODONE HCL 5 MG PO CAPS: 5.0000 mg | ORAL_CAPSULE | ORAL | 0 refills | Status: DC | PRN

## 2022-11-11 MED ORDER — ACETAMINOPHEN 500 MG PO TABS: 500.0000 mg | ORAL_TABLET | Freq: Three times a day (TID) | ORAL | 0 refills | Status: AC

## 2022-11-11 MED ORDER — IBUPROFEN 800 MG PO TABS: 800.0000 mg | ORAL_TABLET | Freq: Three times a day (TID) | ORAL | 0 refills | Status: AC

## 2022-11-11 NOTE — Progress Notes (Signed)
Chief Complaint: right shoulder pain     History of Present Illness:   11/11/2022: Presents today for further discussion of his right shoulder.  He has been working on decreasing his A1c which most recently was 8.  His shoulder motion is decreasing and he is experiencing extremely difficult time laying on the side.  He has been very limited in most activities of daily living with his adhesive capsulitis of the right shoulder.  Albert [name] is a 52 y.o. 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  He has previously been seen by Dr. Roda Shutters and referred here for a glenohumeral injection.  He does have a history of diabetes with a recent A1c in the times.  He states that this has been going on since March when he had a fall at work.    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   Past Surgical History:  Procedure Laterality Date   NO PAST SURGERIES     Social History   Socioeconomic History   Marital status: Significant Other    Spouse name: Not on file   Number of children: Not on file   Years of education: Not on file   Highest education level: Some college, no degree  Occupational History   Not on file  Tobacco Use   Smoking status: Some Days    Packs/day: 0.25    Years: 25.00    Additional pack years: 0.00    Total pack years: 6.25    Types: Cigarettes    Last  attempt to quit: 04/23/2017    Years since quitting: 5.5   Smokeless tobacco: Never  Vaping Use   Vaping Use: Every day   Substances: Nicotine  Substance and Sexual Activity   Alcohol use: Yes    Comment: 6 a week   Drug use: No   Sexual activity: Yes    Partners: Female  Other Topics Concern   Not on file  Social History Narrative   Not on file   Social Determinants of Health   Financial Resource Strain: High Risk (11/04/2022)   Overall Financial Resource Strain (CARDIA)    Difficulty of Paying Living Expenses: Hard  Food Insecurity: No Food Insecurity (11/04/2022)   Hunger Vital Sign    Worried About Running Out of Food in the Last Year: Never true    Ran Out of Food in the Last Year: Never true  Transportation Needs: No Transportation Needs (11/04/2022)   PRAPARE - Administrator, Civil Service (Medical): No    Lack of Transportation (Non-Medical): No  Physical Activity: Insufficiently Active (11/04/2022)   Exercise Vital Sign    Days of  Exercise per Week: 1 day    Minutes of Exercise per Session: 60 min  Stress: Stress Concern Present (11/04/2022)   [employer] of Occupational Health - Occupational Stress Questionnaire    Feeling of Stress : Very much  Social Connections: Socially Isolated (11/04/2022)   Social Connection and Isolation Panel [NHANES]    Frequency of Communication with Friends and Family: Twice a week    Frequency of Social Gatherings with Friends and Family: Never    Attends Religious Services: Never    Database administrator or Organizations: No    Attends Engineer, structural: Not on file    Marital Status: Never married   Family History  Problem Relation Age of Onset   Hypertension Mother    Diabetes type II Mother    Hypertension Sister    Hypertension Maternal Grandmother    Diabetes type II Maternal Grandmother    Breast cancer Paternal Grandfather    Breast cancer Paternal Grandmother    Allergies  Allergen  Reactions   Metformin And Related Nausea Only   Current Outpatient Medications  Medication Sig Dispense Refill   acetaminophen ([employer] ACETAMINOPHEN EX ST) 500 MG tablet TAKE 1 TABLET BY MOUTH EVERY 8 HOURS FOR 10 DAYS. (Patient not taking: Reported on 11/05/2022) 30 tablet 2   amLODipine (NORVASC) 10 MG tablet TAKE 1 TABLET BY MOUTH EVERY DAY 90 tablet 2   aspirin EC 325 MG tablet Take 1 tablet (325 mg total) by mouth daily. (Patient not taking: Reported on 11/05/2022) 30 tablet 0   aspirin EC 81 MG tablet Take 1 tablet (81 mg total) by mouth daily. 90 tablet 3   atenolol (TENORMIN) 25 MG tablet TAKE 1 TABLET (25 MG TOTAL) BY MOUTH DAILY. 90 tablet 2   atorvastatin (LIPITOR) 10 MG tablet Take 1 tablet (10 mg total) by mouth daily. (Patient not taking: Reported on 11/05/2022) 90 tablet 2   clonazePAM (KLONOPIN) 2 MG tablet Take 1 tablet (2 mg total) by mouth 2 (two) times daily as needed for anxiety. 30 tabs must last 30 days. 30 tablet 4   Continuous Blood Gluc Sensor (FREESTYLE LIBRE 3 SENSOR) MISC Place 1 sensor on the skin every 14 days. Use to check glucose continuously 2 each 6   Cyanocobalamin (B-12 PO) Take 1 capsule by mouth daily.     insulin glargine (LANTUS SOLOSTAR) 100 UNIT/ML Solostar Pen Inject 45 Units into the skin 2 (two) times daily. 81 mL 1   Insulin Pen Needle (BD PEN NEEDLE NANO 2ND GEN) 32G X 4 MM MISC USE SUBCUTANEOUSLY DAILY WITH LEVEMIR 100 each 12   Insulin Pen Needle (PEN NEEDLES) 31G X 8 MM MISC Use to inject Victoza daily. 100 each PRN   omeprazole (PRILOSEC OTC) 20 MG tablet Take 20 mg by mouth daily as needed (acid reflux). (Patient not taking: Reported on 11/05/2022)     oxycodone (OXY-IR) 5 MG capsule Take 1 capsule (5 mg total) by mouth every 4 (four) hours as needed (severe pain). (Patient not taking: Reported on 11/05/2022) 20 capsule 0   traZODone (DESYREL) 50 MG tablet Take 1-2 tablets (50-100 mg total) by mouth at bedtime as needed for sleep. 30 tablet 3    valsartan-hydrochlorothiazide (DIOVAN-HCT) 160-25 MG tablet Take 1 tablet by mouth daily. 90 tablet 3   Vilazodone HCl (VIIBRYD) 40 MG TABS Take 1 tablet (40 mg total) by mouth daily. OVERDUE FOR FASTING LABS 30 tablet 0   VITAMIN D PO Take 1 capsule  by mouth daily.     No current facility-administered medications for this visit.   No results found.  Review of Systems:   A ROS was performed including pertinent positives and negatives as documented in the HPI.  Physical Exam :   Constitutional: NAD and appears stated age Neurological: Alert and oriented Psych: Appropriate affect and cooperative There were no vitals taken for this visit.   Comprehensive Musculoskeletal Exam:    Musculoskeletal Exam    Inspection Right Left  Skin No atrophy or winging No atrophy or winging  Palpation    Tenderness Glenohumeral None  Range of Motion    Flexion (passive) 100 170  Flexion (active) 90 170  Abduction 90 170  ER at the side 10 70  Can reach behind back to L5 T12  Strength     Full with pain none  Special Tests    Pseudoparalytic No No  Neurologic    Fires PIN, radial, median, ulnar, musculocutaneous, axillary, suprascapular, long thoracic, and spinal accessory innervated muscles. No abnormal sensibility  Vascular/Lymphatic    Radial Pulse 2+ 2+  Cervical Exam    Patient has symmetric cervical range of motion with negative Spurling's test.  Special Test:      Imaging:   Xray (3 views right shoulder): Normal  MRI right shoulder: There is thickened inferior capsule consistent with adhesive capsulitis.  He does have some interstitial tearing involving the supraspinatus but no discrete tear  I personally reviewed and interpreted the radiographs.   Assessment:   52 y.o. male right-hand-dominant male with right shoulder adhesive capsulitis.  At this time he has now failed multiple injections and physical therapy and does have persistent pain with limited overhead activity.   This is inhibiting his ability to work and perform most basic activities.  Side effect I did discuss with him shoulder debridement.  I did discuss that he has a small interstitial tear of the rotator cuff.  I did describe that unless this is more involved than his MRI suggest that I would not recommend any type of repair of this as this would inhibit his ability to rehab following surgery.  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  I did describe that aggressive postoperative rehab would be required in order to allow for early and improve range of motion.  Finally I did also discuss complications associated with arthroscopic debridement and manipulation such as a proximal humerus fracture.  He understands these and would like to proceed.  Overall I did describe that currently his A1c is quite high and affect I do believe he would benefit from lowered A1c prior to surgical invention.  We did discuss that a goal in the sevens or eights would be more appropriate so as to limit the risk of refreezing.  Plan :    -Plan for right shoulder arthroscopy with extensive debridement, distal clavicle resection, manipulation under anesthesia     After a lengthy discussion of treatment options, including risks, benefits, alternatives, complications of surgical and nonsurgical conservative options, the patient elected surgical repair.   The patient  is aware of the material risks  and complications including, but not limited to injury to adjacent structures, neurovascular injury, infection, numbness, bleeding, implant failure, thermal burns, stiffness, persistent pain, failure to heal, disease transmission from allograft, need for further surgery, dislocation, anesthetic risks, blood clots, risks of death,and others. The probabilities of  surgical success and failure discussed with patient given their particular  co-morbidities.The time and nature of expected rehabilitation and recovery was discussed.The patient's questions were all answered preoperatively.  No barriers to understanding were noted. I explained the natural history of the disease process and Rx rationale.  I explained to the patient what I considered to be reasonable expectations given their personal situation.  The final treatment plan was arrived at through a shared patient decision making process model.   I personally saw and evaluated the patient, and participated in the management and treatment plan.  Huel Cote, MD Attending Physician, Orthopedic Surgery  This document was dictated using Dragon voice recognition software. A reasonable attempt at proof reading has been made to minimize errors.

## 2022-11-16 ENCOUNTER — Other Ambulatory Visit: Payer: Self-pay | Admitting: Family Medicine

## 2022-11-20 ENCOUNTER — Ambulatory Visit (HOSPITAL_BASED_OUTPATIENT_CLINIC_OR_DEPARTMENT_OTHER): Payer: BLUE CROSS/BLUE SHIELD | Admitting: Orthopaedic Surgery

## 2022-12-06 ENCOUNTER — Encounter: Payer: Self-pay | Admitting: Family Medicine

## 2022-12-10 ENCOUNTER — Other Ambulatory Visit (HOSPITAL_COMMUNITY): Payer: BLUE CROSS/BLUE SHIELD

## 2022-12-11 NOTE — Pre-Procedure Instructions (Signed)
Surgical Instructions    Your procedure is scheduled on Thursday, June 6th.  Report to [facility] Main Entrance "A" at 11:00 A.M., then check in with the Admitting office.  Call this number if you have problems the morning of surgery:  [phone]  If you have any questions prior to your surgery date call [phone]: Open Monday-Friday 8am-4pm If you experience any cold or flu symptoms such as cough, fever, chills, shortness of breath, etc. between now and your scheduled surgery, please notify us at the above number.     Remember:  Do not eat after midnight the night before your surgery  You may drink clear liquids until 10:00 AM the morning of your surgery.   Clear liquids allowed are: Water, Non-Citrus Juices (without pulp), Carbonated Beverages, Clear Tea, Black Coffee Only (NO MILK, CREAM OR POWDERED CREAMER of any kind), and Gatorade.    Take these medicines the morning of surgery with A SIP OF WATER  amLODipine (NORVASC)  atenolol (TENORMIN)  Vilazodone HCl (VIIBRYD)    If needed: clonazePAM (KLONOPIN)  oxycodone (OXY-IR)    Follow your surgeon's instructions on when to stop Aspirin.  If no instructions were given by your surgeon then you will need to call the office to get those instructions.     As of today, STOP taking any Aleve, Naproxen, Ibuprofen, Motrin, Advil, Goody's, BC's, all herbal medications, fish oil, and all vitamins.  WHAT DO I DO ABOUT MY DIABETES MEDICATION?  THE NIGHT BEFORE SURGERY, take 22 units (50%) of insulin glargine (LANTUS SOLOSTAR).   THE MORNING OF SURGERY, take 22 units (50%) of insulin glargine (LANTUS SOLOSTAR).     HOW TO MANAGE YOUR DIABETES BEFORE AND AFTER SURGERY  Why is it important to control my blood sugar before and after surgery? Improving blood sugar levels before and after surgery helps healing and can limit problems. A way of improving blood sugar control is eating a healthy diet by:  Eating less sugar and  carbohydrates  Increasing activity/exercise  Talking with your doctor about reaching your blood sugar goals High blood sugars (greater than 180 mg/dL) can raise your risk of infections and slow your recovery, so you will need to focus on controlling your diabetes during the weeks before surgery. Make sure that the doctor who takes care of your diabetes knows about your planned surgery including the date and location.  How do I manage my blood sugar before surgery? Check your blood sugar at least 4 times a day, starting 2 days before surgery, to make sure that the level is not too high or low.  Check your blood sugar the morning of your surgery when you wake up and every 2 hours until you get to the Short Stay unit.  If your blood sugar is less than 70 mg/dL, you will need to treat for low blood sugar: Do not take insulin. Treat a low blood sugar (less than 70 mg/dL) with  cup of clear juice (cranberry or apple), 4 glucose tablets, OR glucose gel. Recheck blood sugar in 15 minutes after treatment (to make sure it is greater than 70 mg/dL). If your blood sugar is not greater than 70 mg/dL on recheck, call [phone] for further instructions. Report your blood sugar to the short stay nurse when you get to Short Stay.  If you are admitted to the hospital after surgery: Your blood sugar will be checked by the staff and you will probably be given insulin after surgery (instead of oral diabetes  medicines) to make sure you have good blood sugar levels. The goal for blood sugar control after surgery is 80-180 mg/dL.                     Do NOT Smoke (Tobacco/Vaping) for 24 hours prior to your procedure.  If you use a CPAP at night, you may bring your mask/headgear for your overnight stay.   Contacts, glasses, piercing's, hearing aid's, dentures or partials may not be worn into surgery, please bring cases for these belongings.    For patients admitted to the hospital, discharge time will be  determined by your treatment team.   Patients discharged the day of surgery will not be allowed to drive home, and someone needs to stay with them for 24 hours.  SURGICAL WAITING ROOM VISITATION Patients having surgery or a procedure may have no more than 2 support people in the waiting area - these visitors may rotate.   Children under the age of 42 must have an adult with them who is not the patient. If the patient needs to stay at the hospital during part of their recovery, the visitor guidelines for inpatient rooms apply. Pre-op nurse will coordinate an appropriate time for 1 support person to accompany patient in pre-op.  This support person may not rotate.   Please refer to the [facility] website for the visitor guidelines for Inpatients (after your surgery is over and you are in a regular room).    Special instructions:   [place]- Preparing For Surgery  Before surgery, you can play an important role. Because skin is not sterile, your skin needs to be as free of germs as possible. You can reduce the number of germs on your skin by washing with CHG (chlorahexidine gluconate) Soap before surgery.  CHG is an antiseptic cleaner which kills germs and bonds with the skin to continue killing germs even after washing.    Oral Hygiene is also important to reduce your risk of infection.  Remember - BRUSH YOUR TEETH THE MORNING OF SURGERY WITH YOUR REGULAR TOOTHPASTE  Please do not use if you have an allergy to CHG or antibacterial soaps. If your skin becomes reddened/irritated stop using the CHG.  Do not shave (including legs and underarms) for at least 48 hours prior to first CHG shower. It is OK to shave your face.  Please follow these instructions carefully.   Shower the NIGHT BEFORE SURGERY and the MORNING OF SURGERY  If you chose to wash your hair, wash your hair first as usual with your normal shampoo.  After you shampoo, rinse your hair and body thoroughly to remove the  shampoo.  Use CHG Soap as you would any other liquid soap. You can apply CHG directly to the skin and wash gently with a scrungie or a clean washcloth.   Apply the CHG Soap to your body ONLY FROM THE NECK DOWN.  Do not use on open wounds or open sores. Avoid contact with your eyes, ears, mouth and genitals (private parts). Wash Face and genitals (private parts)  with your normal soap.   Wash thoroughly, paying special attention to the area where your surgery will be performed.  Thoroughly rinse your body with warm water from the neck down.  DO NOT shower/wash with your normal soap after using and rinsing off the CHG Soap.  Pat yourself dry with a CLEAN TOWEL.  Wear CLEAN PAJAMAS to bed the night before surgery  Place CLEAN SHEETS on your  bed the night before your surgery  DO NOT SLEEP WITH PETS.   Day of Surgery: Take a shower with CHG soap. Do not wear jewelry  Do not wear lotions, powders, colognes, or deodorant. Men may shave face and neck. Do not bring valuables to the hospital. [facility] is not responsible for any belongings or valuables. Wear Clean/Comfortable clothing the morning of surgery Remember to brush your teeth WITH YOUR REGULAR TOOTHPASTE.   Please read over the following fact sheets that you were given.    If you received a COVID test during your pre-op visit  it is requested that you wear a mask when out in public, stay away from anyone that may not be feeling well and notify your surgeon if you develop symptoms. If you have been in contact with anyone that has tested positive in the last 10 days please notify you surgeon.

## 2022-12-14 ENCOUNTER — Ambulatory Visit (HOSPITAL_BASED_OUTPATIENT_CLINIC_OR_DEPARTMENT_OTHER): Payer: Self-pay | Admitting: Orthopaedic Surgery

## 2022-12-14 ENCOUNTER — Encounter (HOSPITAL_COMMUNITY): Payer: Self-pay

## 2022-12-14 ENCOUNTER — Encounter (HOSPITAL_COMMUNITY)
Admission: RE | Admit: 2022-12-14 | Discharge: 2022-12-14 | Disposition: A | Discharge status: Home / Self Care | Payer: BLUE CROSS/BLUE SHIELD | Source: Ambulatory Visit | Attending: Orthopaedic Surgery | Admitting: Orthopaedic Surgery

## 2022-12-14 ENCOUNTER — Other Ambulatory Visit: Payer: Self-pay

## 2022-12-14 VITALS — BP 118/84 | HR 87 | Temp 98.8°F | Resp 18 | Ht 73.0 in | Wt 243.9 lb

## 2022-12-14 DIAGNOSIS — E1129 Type 2 diabetes mellitus with other diabetic kidney complication: Secondary | ICD-10-CM | POA: Insufficient documentation

## 2022-12-14 DIAGNOSIS — W19XXXA Unspecified fall, initial encounter: Secondary | ICD-10-CM | POA: Diagnosis not present

## 2022-12-14 DIAGNOSIS — M7501 Adhesive capsulitis of right shoulder: Secondary | ICD-10-CM

## 2022-12-14 DIAGNOSIS — E119 Type 2 diabetes mellitus without complications: Secondary | ICD-10-CM | POA: Diagnosis not present

## 2022-12-14 DIAGNOSIS — M19011 Primary osteoarthritis, right shoulder: Secondary | ICD-10-CM | POA: Diagnosis not present

## 2022-12-14 DIAGNOSIS — Z01818 Encounter for other preprocedural examination: Secondary | ICD-10-CM | POA: Insufficient documentation

## 2022-12-14 DIAGNOSIS — Z794 Long term (current) use of insulin: Secondary | ICD-10-CM

## 2022-12-14 DIAGNOSIS — Y990 Civilian activity done for income or pay: Secondary | ICD-10-CM | POA: Diagnosis not present

## 2022-12-14 DIAGNOSIS — Z79899 Other long term (current) drug therapy: Secondary | ICD-10-CM | POA: Diagnosis not present

## 2022-12-14 DIAGNOSIS — I10 Essential (primary) hypertension: Secondary | ICD-10-CM | POA: Insufficient documentation

## 2022-12-14 DIAGNOSIS — F17210 Nicotine dependence, cigarettes, uncomplicated: Secondary | ICD-10-CM | POA: Diagnosis not present

## 2022-12-14 DIAGNOSIS — R7401 Elevation of levels of liver transaminase levels: Secondary | ICD-10-CM | POA: Diagnosis not present

## 2022-12-14 DIAGNOSIS — R809 Proteinuria, unspecified: Secondary | ICD-10-CM | POA: Insufficient documentation

## 2022-12-14 DIAGNOSIS — K219 Gastro-esophageal reflux disease without esophagitis: Secondary | ICD-10-CM | POA: Diagnosis not present

## 2022-12-14 LAB — CBC
HCT: 48.4 % (ref 39.0–52.0)
Hemoglobin: 17.2 g/dL — ABNORMAL HIGH (ref 13.0–17.0)
MCH: 33.9 pg (ref 26.0–34.0)
MCHC: 35.5 g/dL (ref 30.0–36.0)
MCV: 95.5 fL (ref 80.0–100.0)
Platelets: 164 10*3/uL (ref 150–400)
RBC: 5.07 MIL/uL (ref 4.22–5.81)
RDW: 12.9 % (ref 11.5–15.5)
WBC: 8.6 10*3/uL (ref 4.0–10.5)
nRBC: 0 % (ref 0.0–0.2)

## 2022-12-14 LAB — COMPREHENSIVE METABOLIC PANEL
ALT: 64 U/L — ABNORMAL HIGH (ref 0–44)
AST: 76 U/L — ABNORMAL HIGH (ref 15–41)
Albumin: 4 g/dL (ref 3.5–5.0)
Alkaline Phosphatase: 72 U/L (ref 38–126)
Anion gap: 12 (ref 5–15)
BUN: <5 mg/dL — ABNORMAL LOW (ref 6–20)
CO2: 29 mmol/L (ref 22–32)
Calcium: 9 mg/dL (ref 8.9–10.3)
Chloride: 97 mmol/L — ABNORMAL LOW (ref 98–111)
Creatinine, Ser: 0.75 mg/dL (ref 0.61–1.24)
GFR, Estimated: >60 mL/min (ref >60)
Glucose, Bld: 156 mg/dL — ABNORMAL HIGH (ref 70–99)
Potassium: 2.9 mmol/L — ABNORMAL LOW (ref 3.5–5.1)
Sodium: 138 mmol/L (ref 135–145)
Total Bilirubin: 1.4 mg/dL — ABNORMAL HIGH (ref 0.3–1.2)
Total Protein: 7.4 g/dL (ref 6.5–8.1)

## 2022-12-14 LAB — SURGICAL PCR SCREEN
MRSA, PCR: NEGATIVE
Staphylococcus aureus: NEGATIVE

## 2022-12-14 LAB — GLUCOSE, CAPILLARY: Glucose-Capillary: 140 mg/dL — ABNORMAL HIGH (ref 70–99)

## 2022-12-14 NOTE — Progress Notes (Addendum)
PCP - Dr, Everrett Coombe  Cardiologist - no  EP-no  Endocrine-no  Pulm-no  Chest x-ray - na  EKG - 07/29/22  Stress Test - no  ECHO - no  Cardiac Cath - no  Sleep Study - no CPAP - no  LABS-CBC CMP  ASA-no  ERAS-Yes  HA1C-8.0- 11/05/22, (down from 12.6 on 07/29/22)  Medication changed to Lantus.  Fasting Blood Sugar - 120- 130 Checks Blood Sugar  times a day has a Glucose reader- it is not working,he is doing finger sticks  Anesthesia- Mr. Albert [name]  has had palpations in the past- the last time was ~6 months ago, Patient has light headedness and heavy legs when he has palpations, denies shortness of breath, feels that the heart rate is faster during this time.  Palpations last 30 mins - 1 hour.  Mr. Albert [name] reported that he is feeling good before the palpations start, he cannot think of anything that he is doing before that would cause palpations.  Pt denies having chest pain, sob, or fever at this time. All instructions explained to the pt, with a verbal understanding of the material. Pt agrees to go over the instructions while at home for a better understanding. The opportunity to ask questions was provided.   Potassium was 2.9 today, I called it to April, Dr. Youlanda Roys scheduler.

## 2022-12-14 NOTE — Pre-Procedure Instructions (Addendum)
Surgical Instructions    Your procedure is scheduled on Thursday, June 6th.  Report to [facility] Main Entrance "A" at 11:00 A.M., then check in with the Admitting office.  Call this number if you have problems the morning of surgery:  [phone]- this is the pre surgery desk  If you have any questions prior to your surgery date call [phone]: Open Monday-Friday 8am-4pm If you experience any cold or flu symptoms such as cough, fever, chills, shortness of breath, etc. between now and your scheduled surgery, please notify us at the above number.     Remember:  Do not eat after midnight the night before your surgery  You may drink clear liquids until 10:00 AM the morning of your surgery.   Clear liquids allowed are: Water, Non-Citrus Juices (without pulp), Carbonated Beverages, Clear Tea, Black Coffee Only (NO MILK, CREAM OR POWDERED CREAMER of any kind), and Gatorade.    Take these medicines the morning of surgery with A SIP OF WATER  amLODipine (NORVASC)  atenolol (TENORMIN)  Vilazodone HCl (VIIBRYD)    If needed: clonazePAM (KLONOPIN)  oxycodone (OXY-IR)    Follow your surgeon's instructions on when to stop Aspirin.  If no instructions were given by your surgeon then you will need to call the office to get those instructions.     As of today, STOP taking any Aleve, Naproxen, Ibuprofen, Motrin, Advil, Goody's, BC's, all herbal medications, fish oil, and all vitamins.  WHAT DO I DO ABOUT MY DIABETES MEDICATION?  THE NIGHT BEFORE SURGERY, take 22 units (50%) of insulin glargine (LANTUS SOLOSTAR).   THE MORNING OF SURGERY, take 22 units (50%) of insulin glargine (LANTUS SOLOSTAR). If CBG is greater than 70.    HOW TO MANAGE YOUR DIABETES BEFORE AND AFTER SURGERY  Why is it important to control my blood sugar before and after surgery? Improving blood sugar levels before and after surgery helps healing and can limit problems. A way of improving blood sugar control is  eating a healthy diet by:  Eating less sugar and carbohydrates  Increasing activity/exercise  Talking with your doctor about reaching your blood sugar goals High blood sugars (greater than 180 mg/dL) can raise your risk of infections and slow your recovery, so you will need to focus on controlling your diabetes during the weeks before surgery. Make sure that the doctor who takes care of your diabetes knows about your planned surgery including the date and location.  How do I manage my blood sugar before surgery? Check your blood sugar at least 4 times a day, starting 2 days before surgery, to make sure that the level is not too high or low.  Check your blood sugar the morning of your surgery when you wake up and every 2 hours until you get to the Short Stay unit.  If your blood sugar is less than 70 mg/dL, you will need to treat for low blood sugar: Do not take insulin. Treat a low blood sugar (less than 70 mg/dL) with  cup of clear juice (cranberry or apple), 4 glucose tablets, OR glucose gel. Recheck blood sugar in 15 minutes after treatment (to make sure it is greater than 70 mg/dL). If your blood sugar is not greater than 70 mg/dL on recheck, call [phone] for further instructions. Report your blood sugar to the short stay nurse when you get to Short Stay.  If you are admitted to the hospital after surgery: Your blood sugar will be checked by the staff and you  will probably be given insulin after surgery (instead of oral diabetes medicines) to make sure you have good blood sugar levels. The goal for blood sugar control after surgery is 80-180 mg/dL.                     Do NOT Smoke (Tobacco/Vaping) for 24 hours prior to your procedure.  If you use a CPAP at night, you may bring your mask/headgear for your overnight stay.   Contacts, glasses, piercing's, hearing aid's, dentures or partials may not be worn into surgery, please bring cases for these belongings.    For patients  admitted to the hospital, discharge time will be determined by your treatment team.   Patients discharged the day of surgery will not be allowed to drive home, and someone needs to stay with them for 24 hours.  SURGICAL WAITING ROOM VISITATION Patients having surgery or a procedure may have no more than 2 support people in the waiting area - these visitors may rotate.   Children under the age of 55 must have an adult with them who is not the patient. If the patient needs to stay at the hospital during part of their recovery, the visitor guidelines for inpatient rooms apply. Pre-op nurse will coordinate an appropriate time for 1 support person to accompany patient in pre-op.  This support person may not rotate.   Please refer to the [facility] website for the visitor guidelines for Inpatients (after your surgery is over and you are in a regular room).    Special instructions:   [place]- Preparing For Surgery  Before surgery, you can play an important role. Because skin is not sterile, your skin needs to be as free of germs as possible. You can reduce the number of germs on your skin by washing with CHG (chlorahexidine gluconate) Soap before surgery.  CHG is an antiseptic cleaner which kills germs and bonds with the skin to continue killing germs even after washing.    Oral Hygiene is also important to reduce your risk of infection.  Remember - BRUSH YOUR TEETH THE MORNING OF SURGERY WITH YOUR REGULAR TOOTHPASTE  Please do not use if you have an allergy to CHG or antibacterial soaps. If your skin becomes reddened/irritated stop using the CHG.  Do not shave (including legs and underarms) for at least 48 hours prior to first CHG shower. It is OK to shave your face.  Please follow these instructions carefully.   Shower the NIGHT BEFORE SURGERY and the MORNING OF SURGERY  If you chose to wash your hair, wash your hair first as usual with your normal shampoo.  After you shampoo, rinse your  hair and body thoroughly to remove the shampoo.  Use CHG Soap as you would any other liquid soap. You can apply CHG directly to the skin and wash gently with a scrungie or a clean washcloth.   Apply the CHG Soap to your body ONLY FROM THE NECK DOWN.  Do not use on open wounds or open sores. Avoid contact with your eyes, ears, mouth and genitals (private parts). Wash Face and genitals (private parts)  with your normal soap.   Wash thoroughly, paying special attention to the area where your surgery will be performed.  Thoroughly rinse your body with warm water from the neck down.  DO NOT shower/wash with your normal soap after using and rinsing off the CHG Soap.  Pat yourself dry with a CLEAN TOWEL.  Wear CLEAN PAJAMAS to  bed the night before surgery  Place CLEAN SHEETS on your bed the night before your surgery  DO NOT SLEEP WITH PETS.   Day of Surgery: Take a shower with CHG soap. Do not wear jewelry  Do not wear lotions, powders, colognes, or deodorant. Men may shave face and neck. Do not bring valuables to the hospital. [facility] is not responsible for any belongings or valuables. Wear Clean/Comfortable clothing the morning of surgery Remember to brush your teeth WITH YOUR REGULAR TOOTHPASTE.   Please read over the following fact sheets that you were given.

## 2022-12-15 ENCOUNTER — Telehealth: Payer: Self-pay | Admitting: Orthopaedic Surgery

## 2022-12-15 ENCOUNTER — Telehealth (INDEPENDENT_AMBULATORY_CARE_PROVIDER_SITE_OTHER): Payer: Self-pay | Admitting: Family Medicine

## 2022-12-15 DIAGNOSIS — Z91199 Patient's noncompliance with other medical treatment and regimen due to unspecified reason: Secondary | ICD-10-CM

## 2022-12-15 NOTE — Telephone Encounter (Signed)
FYI: I spoke with patient in regards to his Potassium level coming back as 2.9. Dr. Steward Drone wants to refer him to his PCP to discuss increasing potassium level before surgery on Thursday. 12/17/22. Patient also wants to discuss why his potassium level continues to decrease as well. Patient has an appointment with GP today at 3:50 pm for evaluation.

## 2022-12-15 NOTE — Progress Notes (Signed)
Attempted to contact the patient. No answer. 

## 2022-12-15 NOTE — Progress Notes (Signed)
Patient contacted for appointment but did not answer.  Did not connect for visit.

## 2022-12-15 NOTE — Telephone Encounter (Signed)
Noted and informed Dr. Steward Drone.

## 2022-12-16 ENCOUNTER — Telehealth: Payer: Self-pay | Admitting: Orthopaedic Surgery

## 2022-12-16 ENCOUNTER — Encounter: Payer: Self-pay | Admitting: Family Medicine

## 2022-12-16 ENCOUNTER — Encounter (HOSPITAL_BASED_OUTPATIENT_CLINIC_OR_DEPARTMENT_OTHER): Payer: Self-pay | Admitting: Orthopaedic Surgery

## 2022-12-16 ENCOUNTER — Other Ambulatory Visit (HOSPITAL_BASED_OUTPATIENT_CLINIC_OR_DEPARTMENT_OTHER): Payer: Self-pay | Admitting: Orthopaedic Surgery

## 2022-12-16 DIAGNOSIS — E876 Hypokalemia: Secondary | ICD-10-CM

## 2022-12-16 DIAGNOSIS — M7501 Adhesive capsulitis of right shoulder: Secondary | ICD-10-CM

## 2022-12-16 DIAGNOSIS — R809 Proteinuria, unspecified: Secondary | ICD-10-CM

## 2022-12-16 NOTE — Anesthesia Preprocedure Evaluation (Signed)
Anesthesia Evaluation  Patient identified by MRN, date of birth, ID band Patient awake    Reviewed: Allergy & Precautions, H&P , NPO status , Patient's Chart, lab work & pertinent test results, reviewed documented beta blocker date and time   Airway Mallampati: III  TM Distance: >3 FB Neck ROM: Full    Dental no notable dental hx. (+) Teeth Intact, Dental Advisory Given   Pulmonary Current Smoker and Patient abstained from smoking.   Pulmonary exam normal breath sounds clear to auscultation       Cardiovascular hypertension, Pt. on medications and Pt. on home beta blockers  Rhythm:Regular Rate:Normal     Neuro/Psych   Anxiety Depression    negative neurological ROS     GI/Hepatic Neg liver ROS,GERD  Medicated,,  Endo/Other  diabetes, Insulin Dependent    Renal/GU negative Renal ROS  negative genitourinary   Musculoskeletal   Abdominal   Peds  Hematology negative hematology ROS (+)   Anesthesia Other Findings   Reproductive/Obstetrics negative OB ROS                             Anesthesia Physical Anesthesia Plan  ASA: 3  Anesthesia Plan: General   Post-op Pain Management: Regional block* and Tylenol PO (pre-op)*   Induction: Intravenous  PONV Risk Score and Plan: 2 and Ondansetron, Dexamethasone and Midazolam  Airway Management Planned: Oral ETT  Additional Equipment:   Intra-op Plan:   Post-operative Plan: Extubation in OR  Informed Consent: I have reviewed the patients History and Physical, chart, labs and discussed the procedure including the risks, benefits and alternatives for the proposed anesthesia with the patient or authorized representative who has indicated his/her understanding and acceptance.     Dental advisory given  Plan Discussed with: CRNA  Anesthesia Plan Comments: (PAT note by Antionette Poles, PA-C: 52 year old male with pertinent history including IDDM  2, HTN, GERD.   Surgery previously canceled due to markedly uncontrolled insulin-dependent type 2 diabetes.  Patient is working with PCP Dr. Ashley Royalty to improve glycemic control.  Most recent A1c 8.0 on 11/05/2022.  Preop labs notable for stable mild transaminitis with AST 76 ALT 74 and moderate hypokalemia potassium 2.9.  Result was called to surgeon's office.  Surgeon directed patient to follow-up with PCP.  Patient was scheduled for follow-up televisit with Dr. Ashley Royalty on 6/24 but reported technical difficulty with completing the call and the appointment was unable to be carried out.  He did report that he picked up prescribed potassium tablets and is increasing dietary potassium intake.  Will get i-STAT day of surgery for recheck of potassium.  EKG 07/29/2022: NSR.  Rate 86.  )        Anesthesia Quick Evaluation

## 2022-12-16 NOTE — Telephone Encounter (Signed)
[name] with [facility] reached out to let us know patient did not have tele-visit appointment with GP yesterday as scheduled. Potassium level will be rechecked in the am, and if it is lower than 2.9 his surgery will be canceled. I called patient and advised. Per patient he was unable to connect for the tele-visit, but he got potasium tablets, and is eating bananas. Patient was asked about blood sugar, and he advised he is keeping a check on that as well. Patient was also advised to reach out to his PCP today to let them know what he is doing to supplement Potassium deficiency.

## 2022-12-16 NOTE — Progress Notes (Signed)
Anesthesia Chart Review:  52 year old male with pertinent history including IDDM 2, HTN, GERD.   Surgery previously canceled due to markedly uncontrolled insulin-dependent type 2 diabetes.  Patient is working with PCP Dr. Ashley Royalty to improve glycemic control.  Most recent A1c 8.0 on 11/05/2022.  Preop labs notable for stable mild transaminitis with AST 76 ALT 74 and moderate hypokalemia potassium 2.9.  Result was called to surgeon's office.  Surgeon directed patient to follow-up with PCP.  Patient was scheduled for follow-up televisit with Dr. Ashley Royalty on 6/24 but reported technical difficulty with completing the call and the appointment was unable to be carried out.  He did report that he picked up prescribed potassium tablets and is increasing dietary potassium intake.  Will get i-STAT day of surgery for recheck of potassium.  EKG 07/29/2022: NSR.  Rate 86.    [facility] Short Stay Center/Anesthesiology Phone [phone] 12/16/2022 11:51 AM

## 2022-12-17 ENCOUNTER — Encounter (HOSPITAL_COMMUNITY): Payer: Self-pay | Admitting: Orthopaedic Surgery

## 2022-12-17 ENCOUNTER — Ambulatory Visit (HOSPITAL_COMMUNITY): Payer: BLUE CROSS/BLUE SHIELD | Admitting: Physician Assistant

## 2022-12-17 ENCOUNTER — Ambulatory Visit (HOSPITAL_COMMUNITY)
Admission: RE | Admit: 2022-12-17 | Discharge: 2022-12-17 | Disposition: A | Discharge status: Home / Self Care | Payer: BLUE CROSS/BLUE SHIELD | Attending: Orthopaedic Surgery | Admitting: Orthopaedic Surgery

## 2022-12-17 ENCOUNTER — Encounter (HOSPITAL_COMMUNITY)
Admission: RE | Disposition: A | Discharge status: Home / Self Care | Payer: Self-pay | Source: Home / Self Care | Attending: Orthopaedic Surgery

## 2022-12-17 DIAGNOSIS — E119 Type 2 diabetes mellitus without complications: Secondary | ICD-10-CM | POA: Insufficient documentation

## 2022-12-17 DIAGNOSIS — Z79899 Other long term (current) drug therapy: Secondary | ICD-10-CM | POA: Diagnosis not present

## 2022-12-17 DIAGNOSIS — R7401 Elevation of levels of liver transaminase levels: Secondary | ICD-10-CM | POA: Diagnosis not present

## 2022-12-17 DIAGNOSIS — F17210 Nicotine dependence, cigarettes, uncomplicated: Secondary | ICD-10-CM | POA: Diagnosis not present

## 2022-12-17 DIAGNOSIS — K219 Gastro-esophageal reflux disease without esophagitis: Secondary | ICD-10-CM | POA: Insufficient documentation

## 2022-12-17 DIAGNOSIS — W19XXXA Unspecified fall, initial encounter: Secondary | ICD-10-CM | POA: Insufficient documentation

## 2022-12-17 DIAGNOSIS — M19011 Primary osteoarthritis, right shoulder: Secondary | ICD-10-CM | POA: Insufficient documentation

## 2022-12-17 DIAGNOSIS — I10 Essential (primary) hypertension: Secondary | ICD-10-CM | POA: Diagnosis not present

## 2022-12-17 DIAGNOSIS — F17200 Nicotine dependence, unspecified, uncomplicated: Secondary | ICD-10-CM | POA: Diagnosis not present

## 2022-12-17 DIAGNOSIS — Y990 Civilian activity done for income or pay: Secondary | ICD-10-CM | POA: Insufficient documentation

## 2022-12-17 DIAGNOSIS — M7501 Adhesive capsulitis of right shoulder: Secondary | ICD-10-CM

## 2022-12-17 DIAGNOSIS — G8918 Other acute postprocedural pain: Secondary | ICD-10-CM | POA: Diagnosis not present

## 2022-12-17 DIAGNOSIS — Z794 Long term (current) use of insulin: Secondary | ICD-10-CM | POA: Diagnosis not present

## 2022-12-17 DIAGNOSIS — R809 Proteinuria, unspecified: Secondary | ICD-10-CM

## 2022-12-17 HISTORY — PX: SUBACROMIAL DECOMPRESSION: SHX5174

## 2022-12-17 HISTORY — PX: SHOULDER ARTHROSCOPY WITH DISTAL CLAVICLE RESECTION: SHX5675

## 2022-12-17 LAB — BASIC METABOLIC PANEL
BUN/Creatinine Ratio: 12 (calc) (ref 6–22)
BUN: 7 mg/dL (ref 7–25)
CO2: 27 mmol/L (ref 20–32)
Calcium: 9.2 mg/dL (ref 8.6–10.3)
Chloride: 100 mmol/L (ref 98–110)
Creat: 0.58 mg/dL — ABNORMAL LOW (ref 0.70–1.30)
Glucose, Bld: 90 mg/dL (ref 65–99)
Potassium: 3 mmol/L — ABNORMAL LOW (ref 3.5–5.3)
Sodium: 141 mmol/L (ref 135–146)

## 2022-12-17 LAB — POCT I-STAT, CHEM 8
BUN: 6 mg/dL (ref 6–20)
Calcium, Ion: 1.14 mmol/L — ABNORMAL LOW (ref 1.15–1.40)
Chloride: 97 mmol/L — ABNORMAL LOW (ref 98–111)
Creatinine, Ser: 0.8 mg/dL (ref 0.61–1.24)
Glucose, Bld: 120 mg/dL — ABNORMAL HIGH (ref 70–99)
HCT: 43 % (ref 39.0–52.0)
Hemoglobin: 14.6 g/dL (ref 13.0–17.0)
Potassium: 3.3 mmol/L — ABNORMAL LOW (ref 3.5–5.1)
Sodium: 139 mmol/L (ref 135–145)
TCO2: 27 mmol/L (ref 22–32)

## 2022-12-17 LAB — GLUCOSE, CAPILLARY
Glucose-Capillary: 104 mg/dL — ABNORMAL HIGH (ref 70–99)
Glucose-Capillary: 93 mg/dL (ref 70–99)

## 2022-12-17 SURGERY — SHOULDER ARTHROSCOPY WITH DISTAL CLAVICLE RESECTION
Anesthesia: General | Site: Shoulder | Laterality: Right

## 2022-12-17 MED ORDER — ROCURONIUM BROMIDE 10 MG/ML (PF) SYRINGE
PREFILLED_SYRINGE | INTRAVENOUS | Status: DC | PRN
  Administered 2022-12-17: 60 mg via INTRAVENOUS

## 2022-12-17 MED ORDER — CHLORHEXIDINE GLUCONATE 0.12 % MT SOLN
OROMUCOSAL | Status: AC
  Administered 2022-12-17: 15 mL via OROMUCOSAL
  Filled 2022-12-17: qty 15

## 2022-12-17 MED ORDER — SUGAMMADEX SODIUM 200 MG/2ML IV SOLN
INTRAVENOUS | Status: DC | PRN
  Administered 2022-12-17: 100 mg via INTRAVENOUS

## 2022-12-17 MED ORDER — LIDOCAINE 2% (20 MG/ML) 5 ML SYRINGE
INTRAMUSCULAR | Status: AC
  Filled 2022-12-17: qty 5

## 2022-12-17 MED ORDER — ONDANSETRON HCL 4 MG/2ML IJ SOLN
INTRAMUSCULAR | Status: AC
  Filled 2022-12-17: qty 2

## 2022-12-17 MED ORDER — HYDROMORPHONE HCL 1 MG/ML IJ SOLN: 0.2500 mg | INTRAMUSCULAR | Status: DC | PRN

## 2022-12-17 MED ORDER — LACTATED RINGERS IV SOLN: INTRAVENOUS | Status: DC

## 2022-12-17 MED ORDER — OXYCODONE HCL 5 MG PO TABS: 5.0000 mg | ORAL_TABLET | ORAL | 0 refills | Status: DC | PRN

## 2022-12-17 MED ORDER — SODIUM CHLORIDE 0.9 % IR SOLN
Status: DC | PRN
  Administered 2022-12-17 (×2): 3000 mL

## 2022-12-17 MED ORDER — PROPOFOL 10 MG/ML IV BOLUS
INTRAVENOUS | Status: AC
  Filled 2022-12-17: qty 20

## 2022-12-17 MED ORDER — CHLORHEXIDINE GLUCONATE 0.12 % MT SOLN: 15.0000 mL | OROMUCOSAL | Status: AC

## 2022-12-17 MED ORDER — EPHEDRINE SULFATE-NACL 50-0.9 MG/10ML-% IV SOSY
PREFILLED_SYRINGE | INTRAVENOUS | Status: DC | PRN
  Administered 2022-12-17 (×4): 5 mg via INTRAVENOUS

## 2022-12-17 MED ORDER — GABAPENTIN 300 MG PO CAPS
300.0000 mg | ORAL_CAPSULE | Freq: Once | ORAL | Status: AC
  Administered 2022-12-17: 300 mg via ORAL
  Filled 2022-12-17: qty 1

## 2022-12-17 MED ORDER — MIDAZOLAM HCL 2 MG/2ML IJ SOLN: 2.0000 mg | Freq: Once | INTRAMUSCULAR | Status: AC

## 2022-12-17 MED ORDER — EPINEPHRINE PF 1 MG/ML IJ SOLN
INTRAMUSCULAR | Status: AC
  Filled 2022-12-17: qty 2

## 2022-12-17 MED ORDER — EPINEPHRINE PF 1 MG/ML IJ SOLN
INTRAMUSCULAR | Status: DC | PRN
  Administered 2022-12-17 (×2): 1 mL via SUBCUTANEOUS

## 2022-12-17 MED ORDER — POTASSIUM CHLORIDE 10 MEQ/100ML IV SOLN
10.0000 meq | INTRAVENOUS | Status: AC
  Administered 2022-12-17 (×2): 10 meq via INTRAVENOUS

## 2022-12-17 MED ORDER — FENTANYL CITRATE (PF) 100 MCG/2ML IJ SOLN: 100.0000 ug | Freq: Once | INTRAMUSCULAR | Status: AC

## 2022-12-17 MED ORDER — MIDAZOLAM HCL 2 MG/2ML IJ SOLN
INTRAMUSCULAR | Status: AC
  Administered 2022-12-17: 2 mg via INTRAVENOUS
  Filled 2022-12-17: qty 2

## 2022-12-17 MED ORDER — FENTANYL CITRATE (PF) 250 MCG/5ML IJ SOLN
INTRAMUSCULAR | Status: AC
  Filled 2022-12-17: qty 5

## 2022-12-17 MED ORDER — LIDOCAINE 2% (20 MG/ML) 5 ML SYRINGE
INTRAMUSCULAR | Status: DC | PRN
  Administered 2022-12-17: 80 mg via INTRAVENOUS

## 2022-12-17 MED ORDER — PHENYLEPHRINE 80 MCG/ML (10ML) SYRINGE FOR IV PUSH (FOR BLOOD PRESSURE SUPPORT)
PREFILLED_SYRINGE | INTRAVENOUS | Status: AC
  Filled 2022-12-17: qty 10

## 2022-12-17 MED ORDER — ROCURONIUM BROMIDE 10 MG/ML (PF) SYRINGE
PREFILLED_SYRINGE | INTRAVENOUS | Status: AC
  Filled 2022-12-17: qty 10

## 2022-12-17 MED ORDER — DEXAMETHASONE SODIUM PHOSPHATE 10 MG/ML IJ SOLN
INTRAMUSCULAR | Status: AC
  Filled 2022-12-17: qty 1

## 2022-12-17 MED ORDER — FENTANYL CITRATE (PF) 100 MCG/2ML IJ SOLN
INTRAMUSCULAR | Status: AC
  Administered 2022-12-17: 100 ug via INTRAVENOUS
  Filled 2022-12-17: qty 2

## 2022-12-17 MED ORDER — ONDANSETRON HCL 4 MG/2ML IJ SOLN
INTRAMUSCULAR | Status: DC | PRN
  Administered 2022-12-17: 4 mg via INTRAVENOUS

## 2022-12-17 MED ORDER — PHENYLEPHRINE HCL-NACL 20-0.9 MG/250ML-% IV SOLN
INTRAVENOUS | Status: DC | PRN
  Administered 2022-12-17: 25 ug/min via INTRAVENOUS

## 2022-12-17 MED ORDER — PROPOFOL 10 MG/ML IV BOLUS
INTRAVENOUS | Status: DC | PRN
  Administered 2022-12-17: 120 mg via INTRAVENOUS

## 2022-12-17 MED ORDER — DEXAMETHASONE SODIUM PHOSPHATE 10 MG/ML IJ SOLN
INTRAMUSCULAR | Status: DC | PRN
  Administered 2022-12-17: 10 mg via INTRAVENOUS

## 2022-12-17 MED ORDER — BUPIVACAINE-EPINEPHRINE (PF) 0.5% -1:200000 IJ SOLN
INTRAMUSCULAR | Status: DC | PRN
  Administered 2022-12-17: 15 mL via PERINEURAL

## 2022-12-17 MED ORDER — POTASSIUM CHLORIDE 10 MEQ/100ML IV SOLN
INTRAVENOUS | Status: AC
  Filled 2022-12-17: qty 200

## 2022-12-17 MED ORDER — MIDAZOLAM HCL 2 MG/2ML IJ SOLN
INTRAMUSCULAR | Status: AC
  Filled 2022-12-17: qty 2

## 2022-12-17 MED ORDER — EPHEDRINE 5 MG/ML INJ
INTRAVENOUS | Status: AC
  Filled 2022-12-17: qty 5

## 2022-12-17 MED ORDER — ACETAMINOPHEN 500 MG PO TABS
1000.0000 mg | ORAL_TABLET | Freq: Once | ORAL | Status: AC
  Administered 2022-12-17: 1000 mg via ORAL
  Filled 2022-12-17: qty 2

## 2022-12-17 MED ORDER — TRANEXAMIC ACID-NACL 1000-0.7 MG/100ML-% IV SOLN
1000.0000 mg | INTRAVENOUS | Status: AC
  Administered 2022-12-17: 1000 mg via INTRAVENOUS
  Filled 2022-12-17: qty 100

## 2022-12-17 MED ORDER — CEFAZOLIN SODIUM-DEXTROSE 2-4 GM/100ML-% IV SOLN
2.0000 g | INTRAVENOUS | Status: AC
  Administered 2022-12-17: 2 g via INTRAVENOUS
  Filled 2022-12-17: qty 100

## 2022-12-17 MED ORDER — PHENYLEPHRINE 80 MCG/ML (10ML) SYRINGE FOR IV PUSH (FOR BLOOD PRESSURE SUPPORT)
PREFILLED_SYRINGE | INTRAVENOUS | Status: DC | PRN
  Administered 2022-12-17: 160 ug via INTRAVENOUS
  Administered 2022-12-17: 80 ug via INTRAVENOUS
  Administered 2022-12-17: 160 ug via INTRAVENOUS

## 2022-12-17 MED ORDER — BUPIVACAINE LIPOSOME 1.3 % IJ SUSP
INTRAMUSCULAR | Status: DC | PRN
  Administered 2022-12-17: 10 mL via PERINEURAL

## 2022-12-17 SURGICAL SUPPLY — 62 items
AID PSTN UNV HD RSTRNT DISP (MISCELLANEOUS) ×2
APL PRP STRL LF DISP 70% ISPRP (MISCELLANEOUS) ×2
BAG COUNTER SPONGE SURGICOUNT (BAG) ×2 IMPLANT
BAG SPNG CNTER NS LX DISP (BAG) ×2
BLADE SURG 11 STRL SS (BLADE) ×2 IMPLANT
CANNULA TWIST IN 8.25X7CM (CANNULA) IMPLANT
CHLORAPREP W/TINT 26 (MISCELLANEOUS) ×2 IMPLANT
COOLER ICEMAN CLASSIC (MISCELLANEOUS) IMPLANT
COVER SURGICAL LIGHT HANDLE (MISCELLANEOUS) ×2 IMPLANT
DRAPE INCISE IOBAN 66X45 STRL (DRAPES) ×4 IMPLANT
DRAPE STERI 35X30 U-POUCH (DRAPES) ×2 IMPLANT
DRAPE U-SHAPE 47X51 STRL (DRAPES) ×4 IMPLANT
DRSG ADAPTIC 3X8 NADH LF (GAUZE/BANDAGES/DRESSINGS) IMPLANT
DRSG TEGADERM 4X4.75 (GAUZE/BANDAGES/DRESSINGS) ×8 IMPLANT
DW OUTFLOW CASSETTE/TUBE SET (MISCELLANEOUS) IMPLANT
ELECT REM PT RETURN 9FT ADLT (ELECTROSURGICAL) ×2
ELECTRODE REM PT RTRN 9FT ADLT (ELECTROSURGICAL) ×2 IMPLANT
FILTER STRAW FLUID ASPIR (MISCELLANEOUS) ×2 IMPLANT
GAUZE PAD ABD 8X10 STRL (GAUZE/BANDAGES/DRESSINGS) ×6 IMPLANT
GAUZE SPONGE 4X4 12PLY STRL (GAUZE/BANDAGES/DRESSINGS) IMPLANT
GAUZE SPONGE 4X4 12PLY STRL LF (GAUZE/BANDAGES/DRESSINGS) ×2 IMPLANT
GAUZE XEROFORM 1X8 LF (GAUZE/BANDAGES/DRESSINGS) ×2 IMPLANT
GLOVE BIOGEL PI IND STRL 8 (GLOVE) ×2 IMPLANT
GLOVE ECLIPSE 6.0 STRL STRAW (GLOVE) ×2 IMPLANT
GLOVE INDICATOR 8.0 STRL GRN (GLOVE) ×2 IMPLANT
GLOVE SURG UNDER POLY LF SZ6.5 (GLOVE) ×2 IMPLANT
GOWN STRL REUS W/ TWL LRG LVL3 (GOWN DISPOSABLE) ×6 IMPLANT
GOWN STRL REUS W/TWL LRG LVL3 (GOWN DISPOSABLE) ×6
KIT BASIN OR (CUSTOM PROCEDURE TRAY) ×2 IMPLANT
KIT TURNOVER KIT B (KITS) ×2 IMPLANT
MANIFOLD NEPTUNE II (INSTRUMENTS) ×2 IMPLANT
NDL HYPO 25X1 1.5 SAFETY (NEEDLE) ×2 IMPLANT
NDL SPNL 18GX3.5 QUINCKE PK (NEEDLE) ×2 IMPLANT
NEEDLE HYPO 25X1 1.5 SAFETY (NEEDLE) ×2 IMPLANT
NEEDLE SPNL 18GX3.5 QUINCKE PK (NEEDLE) ×2 IMPLANT
NS IRRIG 1000ML POUR BTL (IV SOLUTION) ×2 IMPLANT
PACK SHOULDER (CUSTOM PROCEDURE TRAY) ×2 IMPLANT
PAD ABD 8X10 STRL (GAUZE/BANDAGES/DRESSINGS) IMPLANT
PAD ARMBOARD 7.5X6 YLW CONV (MISCELLANEOUS) ×4 IMPLANT
PAD COLD SHLDR WRAP-ON (PAD) IMPLANT
PORT APPOLLO RF 90DEGREE MULTI (SURGICAL WAND) IMPLANT
RESTRAINT HEAD UNIVERSAL NS (MISCELLANEOUS) ×2 IMPLANT
SLING ARM IMMOBILIZER MED (SOFTGOODS) IMPLANT
SPONGE T-LAP 4X18 ~~LOC~~+RFID (SPONGE) ×4 IMPLANT
STRIP CLOSURE SKIN 1/2X4 (GAUZE/BANDAGES/DRESSINGS) ×2 IMPLANT
SUCTION TUBE FRAZIER 10FR DISP (SUCTIONS) ×2 IMPLANT
SUT ETHILON 3 0 PS 1 (SUTURE) ×2 IMPLANT
SUT MNCRL AB 3-0 PS2 18 (SUTURE) ×2 IMPLANT
SUT VIC AB 0 CT1 27 (SUTURE) ×2
SUT VIC AB 0 CT1 27XBRD ANBCTR (SUTURE) ×2 IMPLANT
SUT VIC AB 1 CT1 27 (SUTURE) ×2
SUT VIC AB 1 CT1 27XBRD ANBCTR (SUTURE) ×2 IMPLANT
SUT VIC AB 2-0 CT1 27 (SUTURE) ×2
SUT VIC AB 2-0 CT1 TAPERPNT 27 (SUTURE) ×2 IMPLANT
SYR 20ML LL LF (SYRINGE) ×4 IMPLANT
SYR 3ML LL SCALE MARK (SYRINGE) ×2 IMPLANT
SYR TB 1ML LUER SLIP (SYRINGE) ×2 IMPLANT
TAPE CLOTH 4X10 WHT NS (GAUZE/BANDAGES/DRESSINGS) IMPLANT
TOWEL GREEN STERILE (TOWEL DISPOSABLE) ×2 IMPLANT
TOWEL GREEN STERILE FF (TOWEL DISPOSABLE) ×2 IMPLANT
TUBING ARTHROSCOPY IRRIG 16FT (MISCELLANEOUS) ×2 IMPLANT
WATER STERILE IRR 1000ML POUR (IV SOLUTION) ×2 IMPLANT

## 2022-12-17 NOTE — Brief Op Note (Signed)
   Brief Op Note  Date of Surgery: 12/17/2022  Preoperative Diagnosis: RIGHT SHOULDER ADHESIVE CAPSULITIS  Postoperative Diagnosis: same  Procedure: Procedure(s): RIGHT SHOULDER ARTHROSCOPY WITH DISTAL CLAVICLE RESECTION / CAPSULAR RELEASE and manipulation SUBACROMIAL DECOMPRESSION  Implants: * No implants in log *  Surgeons: Surgeon(s): Huel Cote, MD  Anesthesia: Regional    Estimated Blood Loss: See anesthesia record  Complications: None  Condition to PACU: Stable  Benancio Deeds, MD 12/17/2022 1:02 PM

## 2022-12-17 NOTE — Anesthesia Postprocedure Evaluation (Signed)
Anesthesia Post Note  Patient: Burley Tabron  Procedure(s) Performed: RIGHT SHOULDER ARTHROSCOPY WITH DISTAL CLAVICLE RESECTION / CAPSULAR RELEASE and manipulation (Right: Shoulder) SUBACROMIAL DECOMPRESSION (Right)     Patient location during evaluation: PACU Anesthesia Type: General and Regional Level of consciousness: awake and alert Pain management: pain level controlled Vital Signs Assessment: post-procedure vital signs reviewed and stable Respiratory status: spontaneous breathing, nonlabored ventilation and respiratory function stable Cardiovascular status: blood pressure returned to baseline and stable Postop Assessment: no apparent nausea or vomiting Anesthetic complications: no  No notable events documented.  Last Vitals:  Vitals:   12/17/22 1403 12/17/22 1418  BP:  114/72  Pulse: (!) 50 65  Resp: 14 18  Temp:    SpO2: 96% 92%    Last Pain:  Vitals:   12/17/22 1307  PainSc: 0-No pain                 Ledell Codrington,W. EDMOND

## 2022-12-17 NOTE — Interval H&P Note (Signed)
History and Physical Interval Note:  12/17/2022 11:06 AM  Albert [name]  has presented today for surgery, with the diagnosis of RIGHT SHOULDER ADHESIVE CAPSULITIS.  The various methods of treatment have been discussed with the patient and family. After consideration of risks, benefits and other options for treatment, the patient has consented to  Procedure(s): RIGHT SHOULDER ARTHROSCOPY WITH DISTAL CLAVICLE RESECTION / CAPSULAR RELEASE (Right) as a surgical intervention.  The patient's history has been reviewed, patient examined, no change in status, stable for surgery.  I have reviewed the patient's chart and labs.  Questions were answered to the patient's satisfaction.     Huel Cote

## 2022-12-17 NOTE — Op Note (Signed)
Date of Surgery: 12/17/2022  INDICATIONS: Albert [name] is a 52 y.o.-year-old male with right shoulder adhesive capsulitis failing conservative treatment.  The risk and benefits of the procedure were discussed in detail and documented in the pre-operative evaluation.   PREOPERATIVE DIAGNOSES: Right shoulder, adhesive capsulitis, acromioclavicular arthritis  POSTOPERATIVE DIAGNOSIS: Same.  PROCEDURE: Arthroscopic extensive debridement - 29823 Subdeltoid Bursa, Anterior Labrum, Superior Labrum, and Posterior Labrum Arthroscopic distal clavicle excision - 32355 Arthroscopic subacromial decompression - 29826  SURGEON: Benancio Deeds MD  ASSISTANT: Kerby Less, ATC  ANESTHESIA:  general  IV FLUIDS AND URINE: See anesthesia record.  ANTIBIOTICS: Ancef  ESTIMATED BLOOD LOSS: 10 mL.  IMPLANTS:  * No implants in log *  DRAINS: None  CULTURES: None  COMPLICATIONS: none  PROCEDURE:    OPERATIVE FINDING: Exam under anesthesia:   Examination under anesthesia revealed forward elevation of 120 degrees.  With the arm at the side, there was 20 degrees of external rotation.  After manipulation, forward elevation was to 160 and ER at the side to 50  Arthroscopic findings demonstrated: Articular space: Intact Chondral surfaces: Normal Biceps: Normal Subscapularis: Intact Supraspinatus: Intact Infraspinatus: Intact    I identified the patient in the pre-operative holding area.  I marked the operative right shoulder with my initials. I reviewed the risks and benefits of the proposed surgical intervention and the patient wished to proceed.  Anesthesia was then performed with regional block.  The patient was transferred to the operative suite and placed in the beach chair position with all bony prominences padded.     SCDs were placed on bilateral lower extremity. Appropriate antibiotics was administered within 1 hour before incision.  Anesthesia was induced.  The operative extremity  was then prepped and draped in standard fashion. A time out was performed confirming the correct extremity, correct patient and correct procedure.   The arthroscope was introduced in the glenohumeral joint from a posterior portal.  An anterior portal was created.  The shoulder was examined and the above findings were noted.     With an arthroscopic shaver and a wand ablator, synovitis throughout the  shoulder was resected.  The arthroscopic shaver was used to excise torn portions of the labrum back to a stable margin. Specifically this was done for the anterior superior and posterior labrum.  Capsular release was performed by introducing the straight biter and release of the inferior glenohumeral ligament both viewing from anterior and posterior portal to complete these.   The subacromial space was then approached.. Anterior, lateral, and posterior portals were used.  Bursectomy was performed with an arthroscopic shaver and [employer] wand.  The soft tissues on the undersurface of the acromion and clavicle were resected with the arthroscopic shaver and wand. There was good excursion that was noted of the tendon back to its footprint which would be amendable to an repair.  The distal clavicle was identified and the shaver was introduced anteriorly in order to remove the distal aspect of this and shave mode.  Downward pressure was applied to the clavicle in order to complete the debridement.     The shoulder was irrigated.  The arthroscopic instruments were removed.  Wounds were closed with 3-0 nylon sutures.  A sterile dressing was applied with xeroform, 4x8s, abdominal pad, and tape. An Flonnie Hailstone was placed and the upper extremity was placed in a shoulder immobilizer.  The patient tolerated the procedure well and was taken to the recovery room in stable condition.  All counts were correct  in the case. The patient tolerated the procedure well and was taken to the recovery room in stable  condition.    POSTOPERATIVE PLAN: He will be weightbearing and activity as tolerated on the right arm.  Will get him in with physical therapy immediately postop.  I will see him back in 2 weeks for suture removal  Benancio Deeds, MD 1:04 PM

## 2022-12-17 NOTE — Brief Op Note (Signed)
   Brief Op Note  Date of Surgery: 12/17/2022  Preoperative Diagnosis: RIGHT SHOULDER ADHESIVE CAPSULITIS  Postoperative Diagnosis: same  Procedure: Procedure(s): RIGHT SHOULDER ARTHROSCOPY WITH DISTAL CLAVICLE RESECTION / CAPSULAR RELEASE and manipulation SUBACROMIAL DECOMPRESSION  Implants: * No implants in log *  Surgeons: Surgeon(s): Huel Cote, MD  Anesthesia: Regional    Estimated Blood Loss: See anesthesia record  Complications: None  Condition to PACU: Stable  Benancio Deeds, MD 12/17/2022 1:04 PM

## 2022-12-17 NOTE — Anesthesia Procedure Notes (Addendum)
Procedure Name: Intubation Date/Time: 12/17/2022 11:50 AM  Performed by: Shary Decamp, CRNAPre-anesthesia Checklist: Patient identified, Patient being monitored, Timeout performed, Emergency Drugs available and Suction available Patient Re-evaluated:Patient Re-evaluated prior to induction Oxygen Delivery Method: Circle System Utilized Preoxygenation: Pre-oxygenation with 100% oxygen Induction Type: IV induction Ventilation: Mask ventilation without difficulty Laryngoscope Size: Miller and 2 Grade View: Grade I Tube type: Oral Tube size: 7.5 mm Number of attempts: 1 Airway Equipment and Method: Stylet Placement Confirmation: ETT inserted through vocal cords under direct vision, positive ETCO2 and breath sounds checked- equal and bilateral Secured at: 22 cm Tube secured with: Tape Dental Injury: Teeth and Oropharynx as per pre-operative assessment

## 2022-12-17 NOTE — Discharge Instructions (Signed)
     Discharge Instructions    Attending Surgeon: Huel Cote, MD Office Phone Number: [phone]   Diagnosis and Procedures:    Surgeries Performed: Right shoulder debridement with manipulation, distal clavicle excision  Discharge Plan:    Diet: Resume usual diet. Begin with light or bland foods.  Drink plenty of fluids.  Activity:  Right shoulder activity as tolerated when block wears off. You are advised to go home directly from the hospital or surgical center. Restrict your activities.  GENERAL INSTRUCTIONS: 1.  Please apply ice to your wound to help with swelling and inflammation. This will improve your comfort and your overall recovery following surgery.     2. Please call Dr. Serena Croissant office at [phone] with questions Monday-Friday during business hours. If no one answers, please leave a message and someone should get back to the patient within 24 hours. For emergencies please call 911 or proceed to the emergency room.   3. Patient to notify surgical team if experiences any of the following: Bowel/Bladder dysfunction, uncontrolled pain, nerve/muscle weakness, incision with increased drainage or redness, nausea/vomiting and Fever greater than 101.0 F.  Be alert for signs of infection including redness, streaking, odor, fever or chills. Be alert for excessive pain or bleeding and notify your surgeon immediately.  WOUND INSTRUCTIONS:   Leave your dressing, cast, or splint in place until your post operative visit.  Keep it clean and dry.  Always keep the incision clean and dry until the staples/sutures are removed. If there is no drainage from the incision you should keep it open to air. If there is drainage from the incision you must keep it covered at all times until the drainage stops  Do not soak in a bath tub, hot tub, pool, lake or other body of water until 21 days after your surgery and your incision is completely dry and healed.  If you have removable  sutures (or staples) they must be removed 10-14 days (unless otherwise instructed) from the day of your surgery.     1)  Elevate the extremity as much as possible.  2)  Keep the dressing clean and dry.  3)  Please call us if the dressing becomes wet or dirty.  4)  If you are experiencing worsening pain or worsening swelling, please call.     MEDICATIONS: Resume all previous home medications at the previous prescribed dose and frequency unless otherwise noted Start taking the  pain medications on an as-needed basis as prescribed  Please taper down pain medication over the next week following surgery.  Ideally you should not require a refill of any narcotic pain medication.  Take pain medication with food to minimize nausea. In addition to the prescribed pain medication, you may take over-the-counter pain relievers such as Tylenol.  Do NOT take additional tylenol if your pain medication already has tylenol in it.  Aspirin 325mg  daily for four weeks.      FOLLOWUP INSTRUCTIONS: 1. Follow up at the Physical Therapy Clinic 3-4 days following surgery. This appointment should be scheduled unless other arrangements have been made.The Physical Therapy scheduling number is [phone] if an appointment has not already been arranged.  2. Contact Dr. Serena Croissant office during office hours at [phone] or the practice after hours line at [phone] for non-emergencies. For medical emergencies call 911.   Discharge Location: Home

## 2022-12-17 NOTE — Anesthesia Procedure Notes (Signed)
Anesthesia Regional Block: Interscalene brachial plexus block   Pre-Anesthetic Checklist: , timeout performed,  Correct Patient, Correct Site, Correct Laterality,  Correct Procedure, Correct Position, site marked,  Risks and benefits discussed,  Pre-op evaluation,  At surgeon's request and post-op pain management  Laterality: Right  Prep: Maximum Sterile Barrier Precautions used, chloraprep       Needles:  Injection technique: Single-shot  Needle Type: Echogenic Stimulator Needle     Needle Length: 9cm  Needle Gauge: 21     Additional Needles:   Procedures:,,,, ultrasound used (permanent image in chart),,     Nerve Stimulator or Paresthesia:  Response: Biceps response  Additional Responses:   Narrative:  Start time: 12/17/2022 10:57 AM End time: 12/17/2022 11:07 AM Injection made incrementally with aspirations every 5 mL.  Performed by: Personally  Anesthesiologist: Gaynelle Adu, MD

## 2022-12-17 NOTE — H&P (Signed)
Chief Complaint: right shoulder pain        History of Present Illness:    08/13/2022: Presents today at my request as his most recent A1c was over 12.  He is working on getting this back down.   Jacaree Gulledge is a 52 y.o. 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  He has previously been seen by Dr. Roda Shutters and referred here for a glenohumeral injection.  He does have a history of diabetes with a recent A1c in the times.  He states that this has been going on since March when he had a fall at work.       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         Past Surgical History:  Procedure Laterality Date   NO PAST SURGERIES        Social History         Socioeconomic History   Marital status: Significant Other      Spouse name: Not on file   Number of children: Not on file   Years of education: Not on file   Highest education level: Not on file  Occupational History   Not on file  Tobacco Use   Smoking status: Some Days      Packs/day: 0.25      Years: 25.00      Total pack years: 6.25      Types: Cigarettes      Last attempt to quit: 04/23/2017      Years since quitting: 5.3   Smokeless tobacco: Never  Vaping Use   Vaping Use: Every day   Substances: Nicotine  Substance and Sexual Activity   Alcohol use: Yes      Comment: 6 a week   Drug use: No    Sexual activity: Yes      Partners: Female  Other Topics Concern   Not on file  Social History Narrative   Not on file    Social Determinants of Health    Financial Resource Strain: Not on file  Food Insecurity: Not on file  Transportation Needs: Not on file  Physical Activity: Not on file  Stress: Not on file  Social Connections: Not on file         Family History  Problem Relation Age of Onset   Hypertension Mother     Diabetes type II Mother     Hypertension Sister     Hypertension Maternal Grandmother     Diabetes type II Maternal Grandmother     Breast cancer Paternal Grandfather     Breast cancer Paternal Grandmother          Allergies  Allergen Reactions   Metformin  And Related Nausea Only          Current Outpatient Medications  Medication Sig Dispense Refill   acetaminophen ([employer] ACETAMINOPHEN EX ST) 500 MG tablet TAKE 1 TABLET BY MOUTH EVERY 8 HOURS FOR 10 DAYS. (Patient not taking: Reported on 07/27/2022) 30 tablet 2   amLODipine (NORVASC) 10 MG tablet Take 1 tablet (10 mg total) by mouth daily. 90 tablet 2   aspirin EC 325 MG tablet Take 1 tablet (325 mg total) by mouth daily. 30 tablet 0   aspirin EC 81 MG tablet Take 1 tablet (81 mg total) by mouth daily. 90 tablet 3   atenolol (TENORMIN) 25 MG tablet TAKE 1 TABLET (25 MG TOTAL) BY MOUTH DAILY. 90 tablet 2   atorvastatin (LIPITOR) 10 MG tablet Take 1 tablet (10 mg total) by mouth daily. 90 tablet 2   busPIRone (BUSPAR) 15 MG tablet TAKE 1 TABLET BY MOUTH 3 TIMES DAILY. (Patient not taking: Reported on 07/27/2022) 270 tablet 1   calcium carbonate (TUMS - DOSED IN MG ELEMENTAL CALCIUM) 500 MG chewable tablet Chew 2 tablets by mouth daily as needed for indigestion or heartburn.       clonazePAM (KLONOPIN) 2 MG tablet Take 1 tablet (2 mg total) by mouth 2 (two) times daily as needed for anxiety. 30 tabs must last 30 days. 30 tablet 4   Continuous Blood Gluc Sensor (FREESTYLE LIBRE 3 SENSOR) MISC Place 1 sensor on  the skin every 14 days. Use to check glucose continuously 2 each 6   Cyanocobalamin (B-12 PO) Take 1 capsule by mouth daily.       insulin detemir (LEVEMIR FLEXTOUCH) 100 UNIT/ML FlexPen Inject 44 Units into the skin every evening. 3 mL 3   Insulin Pen Needle (BD PEN NEEDLE NANO 2ND GEN) 32G X 4 MM MISC USE SUBCUTANEOUSLY DAILY WITH LEVEMIR 100 each 12   Insulin Pen Needle (PEN NEEDLES) 31G X 8 MM MISC Use to inject Victoza daily. 100 each PRN   omeprazole (PRILOSEC OTC) 20 MG tablet Take 20 mg by mouth daily as needed (acid reflux).       oxycodone (OXY-IR) 5 MG capsule Take 1 capsule (5 mg total) by mouth every 4 (four) hours as needed (severe pain). 20 capsule 0   valsartan-hydrochlorothiazide (DIOVAN-HCT) 160-25 MG tablet Take 1 tablet by mouth daily. 90 tablet 3   VICTOZA 18 MG/3ML SOPN INJECT 0.6 MG UNDER THE SKIN ONCE DAILY 18 mL 0   Vilazodone HCl (VIIBRYD) 40 MG TABS TAKE 1 TABLET BY MOUTH EVERY DAY 30 tablet 3   VITAMIN D PO Take 1 capsule by mouth daily.        No current facility-administered medications for this visit.    Imaging Results (Last 48 hours)  No results found.     Review of Systems:   A ROS was performed including pertinent positives and negatives as documented in the HPI.   Physical Exam :   Constitutional: NAD and appears stated age Neurological: Alert and oriented Psych: Appropriate affect and cooperative There were no vitals taken for this visit.    Comprehensive Musculoskeletal Exam:     Musculoskeletal Exam      Inspection Right Left  Skin No atrophy or winging No atrophy or winging  Palpation      Tenderness Glenohumeral None  Range of Motion      Flexion (passive) 100 170  Flexion (active) 90 170  Abduction 90 170  ER at the side 10 70  Can reach behind back to L5 T12  Strength        Full with pain none  Special Tests      Pseudoparalytic No No  Neurologic      Fires PIN, radial, median, ulnar, musculocutaneous, axillary, suprascapular,  long thoracic, and spinal accessory innervated muscles. No abnormal sensibility  Vascular/Lymphatic      Radial Pulse 2+ 2+  Cervical Exam      Patient has symmetric cervical range of motion with negative Spurling's test.  Special Test:         Imaging:   Xray (3 views right shoulder): Normal   MRI right shoulder: There is thickened inferior capsule consistent with adhesive capsulitis.  He does have some interstitial tearing involving the supraspinatus but no discrete tear   I personally reviewed and interpreted the radiographs.     Assessment:   52 y.o. male right-hand-dominant male with right shoulder adhesive capsulitis.  At this time he has now failed multiple injections and physical therapy and does have persistent pain with limited overhead activity.  This is inhibiting his ability to work and perform most basic activities.  Side effect I did discuss with him shoulder debridement.  I did discuss that he has a small interstitial tear of the rotator cuff.  I did describe that unless this is more involved than his MRI suggest that I would not recommend any type of repair of this as this would inhibit his ability to rehab following surgery.  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  I did describe that aggressive postoperative rehab would be required in order to allow for early and improve range of motion.  Finally I did also discuss complications associated with arthroscopic debridement and manipulation such as a proximal humerus fracture.  He understands these and would like to proceed.  Overall I did describe that currently his A1c is quite high and affect I do believe he would benefit from lowered A1c prior to surgical invention.  We did discuss that a goal in the sevens or eights would be more appropriate so as to limit the risk of refreezing.   Plan :     -Plan for right  shoulder arthroscopy with extensive debridement, distal clavicle resection, manipulation under anesthesia         After a lengthy discussion of treatment options, including risks, benefits, alternatives, complications of surgical and nonsurgical conservative options, the patient elected surgical repair.    The patient  is aware of the material risks  and complications including, but not limited to injury to adjacent structures, neurovascular injury, infection, numbness, bleeding, implant failure, thermal burns, stiffness, persistent pain, failure to heal, disease transmission from allograft, need for further surgery, dislocation, anesthetic risks, blood clots, risks of death,and others. The probabilities of surgical success and failure discussed with patient given their particular co-morbidities.The time and nature of expected rehabilitation and recovery was discussed.The patient's questions were all answered preoperatively.  No barriers to understanding were noted. I explained the natural history of the disease process and Rx rationale.  I explained to the patient what I considered to be reasonable expectations given their personal situation.  The final treatment plan was arrived at through a shared patient decision making process model.      I personally saw and evaluated the patient, and participated in the management and treatment plan.   Huel Cote, MD  Attending Physician, Orthopedic Surgery   This document was dictated using Dragon voice recognition software. A reasonable attempt at proof reading has been made to minimize errors.

## 2022-12-17 NOTE — Transfer of Care (Signed)
Immediate Anesthesia Transfer of Care Note  Patient: Albert [name]  Procedure(s) Performed: RIGHT SHOULDER ARTHROSCOPY WITH DISTAL CLAVICLE RESECTION / CAPSULAR RELEASE and manipulation (Right: Shoulder) SUBACROMIAL DECOMPRESSION (Right)  Patient Location: PACU  Anesthesia Type:GA combined with regional for post-op pain  Level of Consciousness: awake, alert , oriented, patient cooperative, and responds to stimulation  Airway & Oxygen Therapy: Patient Spontanous Breathing and Patient connected to nasal cannula oxygen  Post-op Assessment: Report given to RN and Post -op Vital signs reviewed and stable  Post vital signs: Reviewed and stable  Last Vitals:  Vitals Value Taken Time  BP 101/66 12/17/22 1306  Temp    Pulse 73 12/17/22 1311  Resp 24 12/17/22 1311  SpO2 96 % 12/17/22 1311  Vitals shown include unvalidated device data.  Last Pain:  Vitals:   12/17/22 1038  PainSc: 0-No pain      Patients Stated Pain Goal: 0 (12/17/22 1038)  Complications: No notable events documented.

## 2022-12-18 ENCOUNTER — Other Ambulatory Visit: Payer: Self-pay

## 2022-12-18 ENCOUNTER — Encounter (HOSPITAL_COMMUNITY): Payer: Self-pay | Admitting: Orthopaedic Surgery

## 2022-12-18 ENCOUNTER — Encounter (HOSPITAL_BASED_OUTPATIENT_CLINIC_OR_DEPARTMENT_OTHER): Payer: Self-pay | Admitting: Orthopaedic Surgery

## 2022-12-18 LAB — POCT I-STAT, CHEM 8
BUN: 6 mg/dL (ref 6–20)
Calcium, Ion: 1.07 mmol/L — ABNORMAL LOW (ref 1.15–1.40)
Chloride: 98 mmol/L (ref 98–111)
Creatinine, Ser: 0.7 mg/dL (ref 0.61–1.24)
Glucose, Bld: 89 mg/dL (ref 70–99)
HCT: 48 % (ref 39.0–52.0)
Hemoglobin: 16.3 g/dL (ref 13.0–17.0)
Potassium: 2.9 mmol/L — ABNORMAL LOW (ref 3.5–5.1)
Sodium: 140 mmol/L (ref 135–145)
TCO2: 30 mmol/L (ref 22–32)

## 2022-12-22 ENCOUNTER — Ambulatory Visit
Payer: BLUE CROSS/BLUE SHIELD | Attending: Orthopaedic Surgery | Admitting: Rehabilitative and Restorative Service Providers"

## 2022-12-30 ENCOUNTER — Telehealth: Payer: Self-pay | Admitting: Orthopaedic Surgery

## 2022-12-30 NOTE — Telephone Encounter (Signed)
[place] forms received. To [employer]. 

## 2022-12-31 ENCOUNTER — Encounter (HOSPITAL_BASED_OUTPATIENT_CLINIC_OR_DEPARTMENT_OTHER): Payer: BLUE CROSS/BLUE SHIELD | Admitting: Orthopaedic Surgery

## 2023-01-01 ENCOUNTER — Encounter: Payer: Self-pay | Admitting: Family Medicine

## 2023-01-01 DIAGNOSIS — F4010 Social phobia, unspecified: Secondary | ICD-10-CM

## 2023-01-01 DIAGNOSIS — F4001 Agoraphobia with panic disorder: Secondary | ICD-10-CM

## 2023-01-05 ENCOUNTER — Other Ambulatory Visit: Payer: Self-pay | Admitting: Medical-Surgical

## 2023-01-05 DIAGNOSIS — F4001 Agoraphobia with panic disorder: Secondary | ICD-10-CM

## 2023-01-05 DIAGNOSIS — F4010 Social phobia, unspecified: Secondary | ICD-10-CM

## 2023-01-05 NOTE — Telephone Encounter (Signed)
Forwarding message to Dr. Tamera Punt covering Dr. Ashley Royalty Requesting rx rf of  clonazpam 2mg   Last written 08/10/2022 Last OV 11/05/2022 Upcoming appt 03/10/2023

## 2023-01-06 ENCOUNTER — Ambulatory Visit (HOSPITAL_BASED_OUTPATIENT_CLINIC_OR_DEPARTMENT_OTHER): Payer: BLUE CROSS/BLUE SHIELD | Admitting: Orthopaedic Surgery

## 2023-01-07 ENCOUNTER — Other Ambulatory Visit: Payer: Self-pay | Admitting: Family Medicine

## 2023-01-08 ENCOUNTER — Encounter (HOSPITAL_BASED_OUTPATIENT_CLINIC_OR_DEPARTMENT_OTHER): Payer: Self-pay

## 2023-01-08 ENCOUNTER — Encounter (HOSPITAL_BASED_OUTPATIENT_CLINIC_OR_DEPARTMENT_OTHER): Payer: BLUE CROSS/BLUE SHIELD | Admitting: Orthopaedic Surgery

## 2023-01-13 ENCOUNTER — Ambulatory Visit (INDEPENDENT_AMBULATORY_CARE_PROVIDER_SITE_OTHER): Payer: BLUE CROSS/BLUE SHIELD | Admitting: Orthopaedic Surgery

## 2023-01-13 DIAGNOSIS — M7501 Adhesive capsulitis of right shoulder: Secondary | ICD-10-CM

## 2023-01-13 NOTE — Progress Notes (Signed)
Post Operative Evaluation    Procedure/Date of Surgery: Right shoulder arthroscopic debridement 2 weeks prior  Interval History:     Presents today 2 weeks status post right shoulder arthroscopic debridement with distal clavicle resection.  Overall he is doing much better.  Not been able to attend physical therapy as result of financial issues.  That being said he is working on rehab at home.  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   Past Surgical History:  Procedure Laterality Date   NO PAST SURGERIES     SHOULDER ARTHROSCOPY WITH DISTAL CLAVICLE RESECTION Right 12/17/2022   Procedure: RIGHT SHOULDER ARTHROSCOPY WITH DISTAL CLAVICLE RESECTION / CAPSULAR RELEASE and manipulation;  Surgeon: Huel Cote, MD;  Location: [facility] OR;  Service: Orthopedics;  Laterality: Right;   SUBACROMIAL DECOMPRESSION Right 12/17/2022   Procedure: SUBACROMIAL DECOMPRESSION;  Surgeon: Huel Cote, MD;  Location: [facility] OR;  Service: Orthopedics;  Laterality: Right;   Social History   Socioeconomic History   Marital status: Significant Other    Spouse name: Not on file   Number of children: Not on file   Years of education: Not on file   Highest education level: Some college, no degree  Occupational History   Not on file  Tobacco Use   Smoking status: Some Days    Packs/day:  0.25    Years: 30.00    Additional pack years: 0.00    Total pack years: 7.50    Types: Cigarettes   Smokeless tobacco: Never  Vaping Use   Vaping Use: Every day  Substance and Sexual Activity   Alcohol use: Yes    Alcohol/week: 24.0 standard drinks of alcohol    Types: 24 Shots of liquor per week   Drug use: No   Sexual activity: Yes    Partners: Female  Other Topics Concern   Not on file  Social History Narrative   Not on file   Social Determinants of Health   Financial Resource Strain: High Risk (11/04/2022)   Overall Financial Resource Strain (CARDIA)    Difficulty of Paying Living Expenses: Hard  Food Insecurity: No Food Insecurity (11/04/2022)   Hunger Vital Sign    Worried About Running Out of Food in the Last Year: Never true    Ran Out of Food in the Last Year: Never true  Transportation Needs: No Transportation Needs (11/04/2022)   PRAPARE - Administrator, Civil Service (Medical): No    Lack of Transportation (Non-Medical): No  Physical Activity: Insufficiently Active (11/04/2022)   Exercise Vital Sign    Days of Exercise per Week: 1 day    Minutes of Exercise per Session: 60  min  Stress: Stress Concern Present (11/04/2022)   [employer] of Occupational Health - Occupational Stress Questionnaire    Feeling of Stress : Very much  Social Connections: Socially Isolated (11/04/2022)   Social Connection and Isolation Panel [NHANES]    Frequency of Communication with Friends and Family: Twice a week    Frequency of Social Gatherings with Friends and Family: Never    Attends Religious Services: Never    Database administrator or Organizations: No    Attends Engineer, structural: Not on file    Marital Status: Never married   Family History  Problem Relation Age of Onset   Hypertension Mother    Diabetes type II Mother    Hypertension Sister    Hypertension Maternal Grandmother    Diabetes type II Maternal Grandmother    Breast cancer  Paternal Grandfather    Breast cancer Paternal Grandmother    Allergies  Allergen Reactions   Metformin And Related Nausea Only   Current Outpatient Medications  Medication Sig Dispense Refill   acetaminophen ([employer] ACETAMINOPHEN EX ST) 500 MG tablet TAKE 1 TABLET BY MOUTH EVERY 8 HOURS FOR 10 DAYS. (Patient not taking: Reported on 11/05/2022) 30 tablet 2   amLODipine (NORVASC) 10 MG tablet TAKE 1 TABLET BY MOUTH EVERY DAY 90 tablet 2   aspirin EC 325 MG tablet Take 1 tablet (325 mg total) by mouth daily. 14 tablet 0   atenolol (TENORMIN) 25 MG tablet TAKE 1 TABLET (25 MG TOTAL) BY MOUTH DAILY. 90 tablet 2   atorvastatin (LIPITOR) 10 MG tablet Take 1 tablet (10 mg total) by mouth daily. (Patient not taking: Reported on 11/05/2022) 90 tablet 2   clonazePAM (KLONOPIN) 2 MG tablet Take 1 tablet (2 mg total) by mouth 2 (two) times daily as needed for anxiety. 30 tabs must last 30 days. 30 tablet 0   Continuous Blood Gluc Sensor (FREESTYLE LIBRE 3 SENSOR) MISC Place 1 sensor on the skin every 14 days. Use to check glucose continuously 2 each 6   Cyanocobalamin (B-12 PO) Take 1 capsule by mouth daily.     insulin glargine (LANTUS SOLOSTAR) 100 UNIT/ML Solostar Pen Inject 45 Units into the skin 2 (two) times daily. 81 mL 1   Insulin Pen Needle (BD PEN NEEDLE NANO 2ND GEN) 32G X 4 MM MISC USE SUBCUTANEOUSLY DAILY WITH LEVEMIR 100 each 12   Insulin Pen Needle (PEN NEEDLES) 31G X 8 MM MISC Use to inject Victoza daily. 100 each PRN   omeprazole (PRILOSEC OTC) 20 MG tablet Take 20 mg by mouth daily as needed (acid reflux). (Patient not taking: Reported on 11/05/2022)     oxycodone (OXY-IR) 5 MG capsule Take 1 capsule (5 mg total) by mouth every 4 (four) hours as needed (severe pain). 10 capsule 0   oxyCODONE (ROXICODONE) 5 MG immediate release tablet Take 1 tablet (5 mg total) by mouth every 4 (four) hours as needed for severe pain or breakthrough pain. 30 tablet 0   traZODone (DESYREL) 50 MG tablet TAKE 1-2  TABLETS BY MOUTH AT BEDTIME AS NEEDED FOR SLEEP. 180 tablet 0   valsartan-hydrochlorothiazide (DIOVAN-HCT) 160-25 MG tablet TAKE 1 TABLET BY MOUTH EVERY DAY 90 tablet 1   Vilazodone HCl (VIIBRYD) 40 MG TABS Take 1 tablet (40 mg total) by mouth daily. OVERDUE FOR FASTING LABS 30 tablet 0   VITAMIN D PO Take 1 capsule by mouth daily.     No current facility-administered medications for this visit.  No results found.  Review of Systems:   A ROS was performed including pertinent positives and negatives as documented in the HPI.   Musculoskeletal Exam:    There were no vitals taken for this visit.  Right shoulder incisions are well-appearing without erythema or drainage.  Forward elevation is to 130 degrees.  External rotation is to 40 degrees internal rotation is to L1 distal neurosensory exam is intact with 2+ radial pulse  Imaging:      I personally reviewed and interpreted the radiographs.   Assessment:   2-week status post right shoulder extensive debridement with this clavicle resection overall doing very well.  At this time no definitive masses range of motion.  I will plan to see him back in 4 weeks for reassessment.  Return to work provided  Plan :    -Return to clinic in 4 weeks for reassessment      I personally saw and evaluated the patient, and participated in the management and treatment plan.  Huel Cote, MD Attending Physician, Orthopedic Surgery  This document was dictated using Dragon voice recognition software. A reasonable attempt at proof reading has been made to minimize errors.

## 2023-01-27 ENCOUNTER — Encounter: Payer: Self-pay | Admitting: Family Medicine

## 2023-01-27 DIAGNOSIS — F5104 Psychophysiologic insomnia: Secondary | ICD-10-CM

## 2023-02-04 ENCOUNTER — Encounter: Payer: Self-pay | Admitting: Family Medicine

## 2023-02-04 ENCOUNTER — Other Ambulatory Visit: Payer: Self-pay

## 2023-02-04 ENCOUNTER — Other Ambulatory Visit: Payer: Self-pay | Admitting: Family Medicine

## 2023-02-04 DIAGNOSIS — F4010 Social phobia, unspecified: Secondary | ICD-10-CM

## 2023-02-04 DIAGNOSIS — F4001 Agoraphobia with panic disorder: Secondary | ICD-10-CM

## 2023-02-04 NOTE — Telephone Encounter (Signed)
Please see request below and advise. Thanks Roselyn Reef, CMA

## 2023-02-04 NOTE — Telephone Encounter (Signed)
Last fill 01/05/23 last visit 11/05/22 next visit 03/10/23

## 2023-02-10 ENCOUNTER — Telehealth: Payer: Self-pay | Admitting: Orthopaedic Surgery

## 2023-02-10 ENCOUNTER — Ambulatory Visit (INDEPENDENT_AMBULATORY_CARE_PROVIDER_SITE_OTHER): Payer: BLUE CROSS/BLUE SHIELD | Admitting: Orthopaedic Surgery

## 2023-02-10 DIAGNOSIS — M7501 Adhesive capsulitis of right shoulder: Secondary | ICD-10-CM | POA: Diagnosis not present

## 2023-02-10 MED ORDER — LIDOCAINE HCL 1 % IJ SOLN
4.0000 mL | INTRAMUSCULAR | Status: AC | PRN
Start: 2023-02-10 — End: 2023-02-10
  Administered 2023-02-10: 4 mL

## 2023-02-10 MED ORDER — TRIAMCINOLONE ACETONIDE 40 MG/ML IJ SUSP
80.0000 mg | INTRAMUSCULAR | Status: AC | PRN
Start: 2023-02-10 — End: 2023-02-10
  Administered 2023-02-10: 80 mg via INTRA_ARTICULAR

## 2023-02-10 NOTE — Progress Notes (Signed)
Post Operative Evaluation    Procedure/Date of Surgery: Right shoulder arthroscopic debridement 2 weeks prior  Interval History:     Presents today 6 weeks status post the above procedure.  Overall he has been having increased pain after returning to work.  He is having a difficult time with overhead motion.   PMH/PSH/Family History/Social History/Meds/Allergies:    Past Medical History:  Diagnosis Date  . ADHD   . Anxiety    Generalized and social  . Depression   . Diabetes mellitus without complication (HCC)    type 2  . Dyslipidemia associated with type 2 diabetes mellitus (HCC) 01/21/2017  . Erectile dysfunction 08/04/2016  . GERD (gastroesophageal reflux disease)   . Hypertension   . Insomnia 06/29/2016  . Low libido 08/04/2016  . Microalbuminuria   . Shoulder dislocation 2008   Left shoulder  . Social anxiety disorder   . Tobacco use disorder   . 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 NO PAST SURGERIES    . SHOULDER ARTHROSCOPY WITH DISTAL CLAVICLE RESECTION Right 12/17/2022   Procedure: RIGHT SHOULDER ARTHROSCOPY WITH DISTAL CLAVICLE RESECTION / CAPSULAR RELEASE and manipulation;  Surgeon: Huel Cote, MD;  Location: [facility] OR;  Service: Orthopedics;  Laterality: Right;  . SUBACROMIAL DECOMPRESSION Right 12/17/2022   Procedure: SUBACROMIAL DECOMPRESSION;  Surgeon: Huel Cote, MD;  Location: [facility] OR;  Service: Orthopedics;  Laterality: Right;   Social History   Socioeconomic History  . Marital status: Significant Other    Spouse name: Not on file  . Number of children: Not on file  . Years of education: Not on file  . Highest education level: Some college, no degree  Occupational History  . Not on file  Tobacco Use  . Smoking status: Some Days    Current packs/day: 0.25    Average packs/day: 0.3 packs/day for 30.0 years (7.5 ttl pk-yrs)    Types:  Cigarettes  . Smokeless tobacco: Never  Vaping Use  . Vaping status: Every Day  Substance and Sexual Activity  . Alcohol use: Yes    Alcohol/week: 24.0 standard drinks of alcohol    Types: 24 Shots of liquor per week  . Drug use: No  . Sexual activity: Yes    Partners: Female  Other Topics Concern  . Not on file  Social History Narrative  . Not on file   Social Determinants of Health   Financial Resource Strain: High Risk (11/04/2022)   Overall Financial Resource Strain (CARDIA)   . Difficulty of Paying Living Expenses: Hard  Food Insecurity: No Food Insecurity (11/04/2022)   Hunger Vital Sign   . Worried About Programme researcher, broadcasting/film/video in the Last Year: Never true   . Ran Out of Food in the Last Year: Never true  Transportation Needs: No Transportation Needs (11/04/2022)   PRAPARE - Transportation   . Lack of Transportation (Medical): No   . Lack of Transportation (Non-Medical): No  Physical Activity: Insufficiently Active (11/04/2022)   Exercise Vital Sign   . Days of Exercise per Week: 1 day   . Minutes of Exercise per Session: 60 min  Stress: Stress Concern Present (11/04/2022)   [employer] of Occupational Health - Occupational Stress Questionnaire   . Feeling of Stress :  Very much  Social Connections: Socially Isolated (11/04/2022)   Social Connection and Isolation Panel [NHANES]   . Frequency of Communication with Friends and Family: Twice a week   . Frequency of Social Gatherings with Friends and Family: Never   . Attends Religious Services: Never   . Active Member of Clubs or Organizations: No   . Attends Banker Meetings: Not on file   . Marital Status: Never married   Family History  Problem Relation Age of Onset  . Hypertension Mother   . Diabetes type II Mother   . Hypertension Sister   . Hypertension Maternal Grandmother   . Diabetes type II Maternal Grandmother   . Breast cancer Paternal Grandfather   . Breast cancer Paternal Grandmother     Allergies  Allergen Reactions  . Metformin And Related Nausea Only   Current Outpatient Medications  Medication Sig Dispense Refill  . acetaminophen ([facility]S ACETAMINOPHEN EX ST) 500 MG tablet TAKE 1 TABLET BY MOUTH EVERY 8 HOURS FOR 10 DAYS. (Patient not taking: Reported on 11/05/2022) 30 tablet 2  . amLODipine (NORVASC) 10 MG tablet TAKE 1 TABLET BY MOUTH EVERY DAY 90 tablet 2  . aspirin EC 325 MG tablet Take 1 tablet (325 mg total) by mouth daily. 14 tablet 0  . atenolol (TENORMIN) 25 MG tablet TAKE 1 TABLET (25 MG TOTAL) BY MOUTH DAILY. 90 tablet 2  . atorvastatin (LIPITOR) 10 MG tablet Take 1 tablet (10 mg total) by mouth daily. (Patient not taking: Reported on 11/05/2022) 90 tablet 2  . clonazePAM (KLONOPIN) 2 MG tablet TAKE 1 TABLET BY MOUTH 2 TIMES DAILY AS NEEDED FOR ANXIETY. 30 TABS MUST LAST 30 DAYS. 30 tablet 0  . Continuous Blood Gluc Sensor (FREESTYLE LIBRE 3 SENSOR) MISC Place 1 sensor on the skin every 14 days. Use to check glucose continuously 2 each 6  . Cyanocobalamin (B-12 PO) Take 1 capsule by mouth daily.    . IBU 800 MG tablet Take by mouth.    . insulin glargine (LANTUS SOLOSTAR) 100 UNIT/ML Solostar Pen Inject 45 Units into the skin 2 (two) times daily. 81 mL 1  . Insulin Pen Needle (BD PEN NEEDLE NANO 2ND GEN) 32G X 4 MM MISC USE SUBCUTANEOUSLY DAILY WITH LEVEMIR 100 each 12  . Insulin Pen Needle (PEN NEEDLES) 31G X 8 MM MISC Use to inject Victoza daily. 100 each PRN  . omeprazole (PRILOSEC OTC) 20 MG tablet Take 20 mg by mouth daily as needed (acid reflux). (Patient not taking: Reported on 11/05/2022)    . oxycodone (OXY-IR) 5 MG capsule Take 1 capsule (5 mg total) by mouth every 4 (four) hours as needed (severe pain). 10 capsule 0  . oxyCODONE (ROXICODONE) 5 MG immediate release tablet Take 1 tablet (5 mg total) by mouth every 4 (four) hours as needed for severe pain or breakthrough pain. 30 tablet 0  . traZODone (DESYREL) 50 MG tablet TAKE 1-2 TABLETS BY MOUTH AT  BEDTIME AS NEEDED FOR SLEEP. 180 tablet 0  . valsartan-hydrochlorothiazide (DIOVAN-HCT) 160-25 MG tablet TAKE 1 TABLET BY MOUTH EVERY DAY 90 tablet 1  . Vilazodone HCl (VIIBRYD) 40 MG TABS Take 1 tablet (40 mg total) by mouth daily. OVERDUE FOR FASTING LABS 30 tablet 0  . VITAMIN D PO Take 1 capsule by mouth daily.     No current facility-administered medications for this visit.   No results found.  Review of Systems:   A ROS was performed including pertinent positives and  negatives as documented in the HPI.   Musculoskeletal Exam:    There were no vitals taken for this visit.  Right shoulder incisions are well-appearing without erythema or drainage.  Forward elevation is to 130 degrees.  External rotation is to 40 degrees internal rotation is to L1 distal neurosensory exam is intact with 2+ radial pulse  Imaging:      I personally reviewed and interpreted the radiographs.   Assessment:   6-week status post right shoulder extensive debridement with this clavicle resection overall doing very well.  At this time he has somewhat flared up the shoulder in the setting of attempting to get back to work.  I did discuss that this unfortunately is common with adhesive capsulitis.  At this time we will plan to dial back his work schedule to 20 hours/week and I will plan on an ultrasound-guided glenohumeral injection at today's visit  Plan :    -Right shoulder ultrasound-guided visit performed and verbal consent obtained    Procedure Note  Patient: Albert [name]             Date of Birth: [birth year]           MRN: [id number]             Visit Date: 02/10/2023  Procedures: Visit Diagnoses: No diagnosis found.  Large Joint Inj: R glenohumeral on 02/10/2023 12:11 PM Indications: pain Details: 22 G 1.5 in needle, ultrasound-guided anterior approach  Arthrogram: No  Medications: 4 mL lidocaine 1 %; 80 mg triamcinolone acetonide 40 MG/ML Outcome: tolerated well, no immediate  complications Procedure, treatment alternatives, risks and benefits explained, specific risks discussed. Consent was given by the patient. Immediately prior to procedure a time out was called to verify the correct patient, procedure, equipment, support staff and site/side marked as required. Patient was prepped and draped in the usual sterile fashion.           I personally saw and evaluated the patient, and participated in the management and treatment plan.  Huel Cote, MD Attending Physician, Orthopedic Surgery  This document was dictated using Dragon voice recognition software. A reasonable attempt at proof reading has been made to minimize errors.

## 2023-02-10 NOTE — Telephone Encounter (Signed)
[place] forms received. To [employer]. 

## 2023-02-19 ENCOUNTER — Encounter (HOSPITAL_BASED_OUTPATIENT_CLINIC_OR_DEPARTMENT_OTHER): Payer: Self-pay | Admitting: Orthopaedic Surgery

## 2023-03-07 ENCOUNTER — Other Ambulatory Visit: Payer: Self-pay | Admitting: Family Medicine

## 2023-03-07 DIAGNOSIS — F4001 Agoraphobia with panic disorder: Secondary | ICD-10-CM

## 2023-03-07 DIAGNOSIS — F4010 Social phobia, unspecified: Secondary | ICD-10-CM

## 2023-03-09 ENCOUNTER — Encounter: Payer: Self-pay | Admitting: Family Medicine

## 2023-03-10 ENCOUNTER — Ambulatory Visit (INDEPENDENT_AMBULATORY_CARE_PROVIDER_SITE_OTHER): Payer: BLUE CROSS/BLUE SHIELD | Admitting: Family Medicine

## 2023-03-10 ENCOUNTER — Encounter: Payer: Self-pay | Admitting: Family Medicine

## 2023-03-10 VITALS — BP 91/60 | HR 88 | Ht 73.0 in | Wt 228.0 lb

## 2023-03-10 DIAGNOSIS — R809 Proteinuria, unspecified: Secondary | ICD-10-CM | POA: Diagnosis not present

## 2023-03-10 DIAGNOSIS — F4010 Social phobia, unspecified: Secondary | ICD-10-CM | POA: Diagnosis not present

## 2023-03-10 DIAGNOSIS — G4700 Insomnia, unspecified: Secondary | ICD-10-CM | POA: Diagnosis not present

## 2023-03-10 DIAGNOSIS — Z794 Long term (current) use of insulin: Secondary | ICD-10-CM | POA: Diagnosis not present

## 2023-03-10 DIAGNOSIS — F4001 Agoraphobia with panic disorder: Secondary | ICD-10-CM | POA: Diagnosis not present

## 2023-03-10 DIAGNOSIS — I152 Hypertension secondary to endocrine disorders: Secondary | ICD-10-CM

## 2023-03-10 DIAGNOSIS — E1129 Type 2 diabetes mellitus with other diabetic kidney complication: Secondary | ICD-10-CM | POA: Diagnosis not present

## 2023-03-10 DIAGNOSIS — E1159 Type 2 diabetes mellitus with other circulatory complications: Secondary | ICD-10-CM

## 2023-03-10 LAB — POCT GLYCOSYLATED HEMOGLOBIN (HGB A1C): HbA1c, POC (controlled diabetic range): 6.8 % (ref 0.0–7.0)

## 2023-03-10 MED ORDER — CLONAZEPAM 2 MG PO TABS
2.0000 mg | ORAL_TABLET | Freq: Two times a day (BID) | ORAL | 4 refills | Status: DC | PRN
Start: 2023-03-10 — End: 2023-08-06

## 2023-03-10 MED ORDER — QUETIAPINE FUMARATE 50 MG PO TABS: 50.0000 mg | ORAL_TABLET | Freq: Every day | ORAL | 1 refills | Status: DC

## 2023-03-10 NOTE — Progress Notes (Signed)
Albert [name] - 52 y.o. male MRN [id number]  Date of birth: July 10, 1971  Subjective Chief Complaint  Patient presents with   Insomnia   Hypotension    HPI Albert [name] is a 52 y.o. male here today for follow-up visit.  Reports that he has had increased difficulty with sleep.  Sleeping only a couple hours each night.  He has tried trazodone without much improvement.  Additionally, he has tried over-the-counter melatonin.  He has tried his clonazepam at night but this does not seem to help him fall asleep either.  Continues on amlodipine, atenolol, valsartan/hydrochlorothiazide for management of hypertension.  Blood pressure is little low today.  He denies symptoms related to hypertension.  He has not had chest pain, shortness of breath, palpitations headaches or dizziness.  Blood sugars have been better controlled.  Doing well with Lantus.  Denies symptoms of hypoglycemia.  Continues on atorvastatin for management of associated hyperlipidemia.  A1c improved today to 6.8%.  ROS:  A comprehensive ROS was completed and negative except as noted per HPI  Allergies  Allergen Reactions   Metformin And Related Nausea Only    Past Medical History:  Diagnosis Date   ADHD    Anxiety    Generalized and social   Depression    Diabetes mellitus without complication (HCC)    type 2   Dyslipidemia associated with type 2 diabetes mellitus (HCC) 01/21/2017   Erectile dysfunction 08/04/2016   GERD (gastroesophageal reflux disease)    Hypertension    Insomnia 06/29/2016   Low libido 08/04/2016   Microalbuminuria    Shoulder dislocation 2008   Left shoulder   Social anxiety disorder    Tobacco use disorder    Tuberculosis    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    Past Surgical History:  Procedure Laterality Date   NO PAST SURGERIES     SHOULDER ARTHROSCOPY WITH DISTAL CLAVICLE RESECTION Right 12/17/2022   Procedure: RIGHT SHOULDER ARTHROSCOPY WITH DISTAL CLAVICLE RESECTION / CAPSULAR RELEASE  and manipulation;  Surgeon: Huel Cote, MD;  Location: [facility] OR;  Service: Orthopedics;  Laterality: Right;   SUBACROMIAL DECOMPRESSION Right 12/17/2022   Procedure: SUBACROMIAL DECOMPRESSION;  Surgeon: Huel Cote, MD;  Location: [facility] OR;  Service: Orthopedics;  Laterality: Right;    Social History   Socioeconomic History   Marital status: Significant Other    Spouse name: Not on file   Number of children: Not on file   Years of education: Not on file   Highest education level: Some college, no degree  Occupational History   Not on file  Tobacco Use   Smoking status: Some Days    Current packs/day: 0.25    Average packs/day: 0.3 packs/day for 30.0 years (7.5 ttl pk-yrs)    Types: Cigarettes   Smokeless tobacco: Never  Vaping Use   Vaping status: Every Day  Substance and Sexual Activity   Alcohol use: Yes    Alcohol/week: 24.0 standard drinks of alcohol    Types: 24 Shots of liquor per week   Drug use: No   Sexual activity: Yes    Partners: Female  Other Topics Concern   Not on file  Social History Narrative   Not on file   Social Determinants of Health   Financial Resource Strain: High Risk (11/04/2022)   Overall Financial Resource Strain (CARDIA)    Difficulty of Paying Living Expenses: Hard  Food Insecurity: No Food Insecurity (11/04/2022)   Hunger Vital Sign    Worried About Running Out  of Food in the Last Year: Never true    Ran Out of Food in the Last Year: Never true  Transportation Needs: No Transportation Needs (11/04/2022)   PRAPARE - Administrator, Civil Service (Medical): No    Lack of Transportation (Non-Medical): No  Physical Activity: Insufficiently Active (11/04/2022)   Exercise Vital Sign    Days of Exercise per Week: 1 day    Minutes of Exercise per Session: 60 min  Stress: Stress Concern Present (11/04/2022)   [employer] of Occupational Health - Occupational Stress Questionnaire    Feeling of Stress : Very much  Social  Connections: Socially Isolated (11/04/2022)   Social Connection and Isolation Panel [NHANES]    Frequency of Communication with Friends and Family: Twice a week    Frequency of Social Gatherings with Friends and Family: Never    Attends Religious Services: Never    Database administrator or Organizations: No    Attends Engineer, structural: Not on file    Marital Status: Never married    Family History  Problem Relation Age of Onset   Hypertension Mother    Diabetes type II Mother    Hypertension Sister    Hypertension Maternal Grandmother    Diabetes type II Maternal Grandmother    Breast cancer Paternal Grandfather    Breast cancer Paternal Grandmother     Health Maintenance  Topic Date Due   HIV Screening  Never done   Hepatitis C Screening  Never done   Colonoscopy  Never done   Diabetic kidney evaluation - Urine ACR  01/12/2018   COVID-19 Vaccine (3 - 2023-24 season) 04/23/2023 (Originally 03/13/2022)   Zoster Vaccines- Shingrix (1 of 2) 02/04/2024 (Originally 03/27/2022)   FOOT EXAM  05/20/2023   OPHTHALMOLOGY EXAM  05/20/2023   HEMOGLOBIN A1C  09/10/2023   Diabetic kidney evaluation - eGFR measurement  12/16/2023   DTaP/Tdap/Td (2 - Td or Tdap) 12/05/2028   HPV VACCINES  Aged Out   INFLUENZA VACCINE  Discontinued     ----------------------------------------------------------------------------------------------------------------------------------------------------------------------------------------------------------------- Physical Exam BP 91/60 (BP Location: Left Arm, Patient Position: Sitting, Cuff Size: Normal)   Pulse 88   Ht 6\' 1"  (1.854 m)   Wt 228 lb (103.4 kg)   SpO2 98%   BMI 30.08 kg/m   Physical Exam Constitutional:      Appearance: Normal appearance.  HENT:     Head: Normocephalic and atraumatic.  Eyes:     General: No scleral icterus. Cardiovascular:     Rate and Rhythm: Normal rate and regular rhythm.  Pulmonary:     Effort:  Pulmonary effort is normal.     Breath sounds: Normal breath sounds.  Neurological:     Mental Status: He is alert.  Psychiatric:        Mood and Affect: Mood normal.        Behavior: Behavior normal.     ------------------------------------------------------------------------------------------------------------------------------------------------------------------------------------------------------------------- Assessment and Plan  Insomnia Continues to have difficulty with sleep.  Discontinue trazodone.  Add Seroquel 50 to 100 mg nightly.  Discussed sleep hygiene.  Hypertension associated with diabetes (HCC) Blood pressure little low today however he denies any symptoms.  Encouraged to hydrate well.  Will have him check blood pressure at home and he will let me know if this is still running low.  I recommended to him that he reduce his amlodipine to half a tablet if blood pressure remains low.  Panic disorder with agoraphobia He has tried multiple SSRIs/SNRIs without  significant improvement.  He may continue clonazepam as needed for now.  Discussed trying to use this as little as possible.  Type 2 diabetes mellitus with microalbuminuria, with long-term current use of insulin (HCC) Blood sugars are much better controlled.  Will continue Lantus at current strength.   Meds ordered this encounter  Medications   QUEtiapine (SEROQUEL) 50 MG tablet    Sig: Take 1-2 tablets (50-100 mg total) by mouth at bedtime.    Dispense:  90 tablet    Refill:  1   clonazePAM (KLONOPIN) 2 MG tablet    Sig: Take 1 tablet (2 mg total) by mouth 2 (two) times daily as needed for anxiety.    Dispense:  30 tablet    Refill:  4    Not to exceed 4 additional fills before 08/03/2023    Return in about 2 months (around 05/10/2023) for Insomnia.    This visit occurred during the SARS-CoV-2 public health emergency.  Safety protocols were in place, including screening questions prior to the visit,  additional usage of staff PPE, and extensive cleaning of exam room while observing appropriate contact time as indicated for disinfecting solutions.

## 2023-03-10 NOTE — Assessment & Plan Note (Signed)
Blood pressure little low today however he denies any symptoms.  Encouraged to hydrate well.  Will have him check blood pressure at home and he will let me know if this is still running low.  I recommended to him that he reduce his amlodipine to half a tablet if blood pressure remains low.

## 2023-03-10 NOTE — Assessment & Plan Note (Signed)
Blood sugars are much better controlled.  Will continue Lantus at current strength.

## 2023-03-10 NOTE — Assessment & Plan Note (Signed)
He has tried multiple SSRIs/SNRIs without significant improvement.  He may continue clonazepam as needed for now.  Discussed trying to use this as little as possible.

## 2023-03-10 NOTE — Assessment & Plan Note (Signed)
Continues to have difficulty with sleep.  Discontinue trazodone.  Add Seroquel 50 to 100 mg nightly.  Discussed sleep hygiene.

## 2023-03-24 ENCOUNTER — Ambulatory Visit (HOSPITAL_BASED_OUTPATIENT_CLINIC_OR_DEPARTMENT_OTHER): Payer: BLUE CROSS/BLUE SHIELD | Admitting: Orthopaedic Surgery

## 2023-04-16 ENCOUNTER — Other Ambulatory Visit: Payer: Self-pay | Admitting: Family Medicine

## 2023-04-21 NOTE — Telephone Encounter (Signed)
Called patient, LVM to call office to schedule appt, thanks.

## 2023-05-10 ENCOUNTER — Other Ambulatory Visit: Payer: Self-pay | Admitting: Family Medicine

## 2023-05-10 ENCOUNTER — Encounter: Payer: Self-pay | Admitting: Family Medicine

## 2023-05-10 ENCOUNTER — Ambulatory Visit: Payer: BLUE CROSS/BLUE SHIELD | Admitting: Family Medicine

## 2023-05-10 MED ORDER — TADALAFIL 5 MG PO TABS: 5.0000 mg | ORAL_TABLET | Freq: Every day | ORAL | 11 refills | Status: AC | PRN

## 2023-05-31 ENCOUNTER — Ambulatory Visit: Payer: BLUE CROSS/BLUE SHIELD | Admitting: Family Medicine

## 2023-08-01 ENCOUNTER — Other Ambulatory Visit: Payer: Self-pay | Admitting: Family Medicine

## 2023-08-01 DIAGNOSIS — F4001 Agoraphobia with panic disorder: Secondary | ICD-10-CM

## 2023-08-01 DIAGNOSIS — F4010 Social phobia, unspecified: Secondary | ICD-10-CM

## 2023-08-03 NOTE — Telephone Encounter (Signed)
Called patient left vm that labs are in the computer

## 2023-08-03 NOTE — Telephone Encounter (Signed)
Pls contact the patient to advise labs have been ordered. Thanks

## 2023-08-06 ENCOUNTER — Ambulatory Visit: Payer: BLUE CROSS/BLUE SHIELD | Admitting: Family Medicine

## 2023-08-06 VITALS — BP 86/60 | HR 91 | Ht 73.0 in | Wt 226.0 lb

## 2023-08-06 DIAGNOSIS — I152 Hypertension secondary to endocrine disorders: Secondary | ICD-10-CM

## 2023-08-06 DIAGNOSIS — E1129 Type 2 diabetes mellitus with other diabetic kidney complication: Secondary | ICD-10-CM

## 2023-08-06 DIAGNOSIS — R7989 Other specified abnormal findings of blood chemistry: Secondary | ICD-10-CM

## 2023-08-06 DIAGNOSIS — M2550 Pain in unspecified joint: Secondary | ICD-10-CM

## 2023-08-06 DIAGNOSIS — E785 Hyperlipidemia, unspecified: Secondary | ICD-10-CM | POA: Diagnosis not present

## 2023-08-06 DIAGNOSIS — E1159 Type 2 diabetes mellitus with other circulatory complications: Secondary | ICD-10-CM

## 2023-08-06 DIAGNOSIS — Z0000 Encounter for general adult medical examination without abnormal findings: Secondary | ICD-10-CM | POA: Insufficient documentation

## 2023-08-06 DIAGNOSIS — Z125 Encounter for screening for malignant neoplasm of prostate: Secondary | ICD-10-CM | POA: Diagnosis not present

## 2023-08-06 DIAGNOSIS — Z794 Long term (current) use of insulin: Secondary | ICD-10-CM

## 2023-08-06 DIAGNOSIS — M130 Polyarthritis, unspecified: Secondary | ICD-10-CM

## 2023-08-06 DIAGNOSIS — R809 Proteinuria, unspecified: Secondary | ICD-10-CM

## 2023-08-06 DIAGNOSIS — E559 Vitamin D deficiency, unspecified: Secondary | ICD-10-CM | POA: Diagnosis not present

## 2023-08-06 DIAGNOSIS — E1169 Type 2 diabetes mellitus with other specified complication: Secondary | ICD-10-CM | POA: Diagnosis not present

## 2023-08-06 NOTE — Progress Notes (Unsigned)
Albert [name] - 53 y.o. male MRN [id number]  Date of birth: 05-12-1971  Subjective No chief complaint on file.   HPI Albert [name] is a 53 y.o. male here today with complaint of back and shoulder pain.  Pain is located in upper back.  He does have history of adhesive capsulitis of the R shoulder and had previous manipulation under anesthesia.   BP is low today.  He is on valsartan/hydrochlorothiazide, amlodpine and atenolol for HTN. He denies dizziness at this time.   Diabetes is managed with lantus 45 units BID.    Review of Systems  Constitutional:  Negative for chills, fever, malaise/fatigue and weight loss.  HENT:  Negative for congestion, ear pain and sore throat.   Eyes:  Negative for blurred vision, double vision and pain.  Respiratory:  Negative for cough and shortness of breath.   Cardiovascular:  Negative for chest pain and palpitations.  Gastrointestinal:  Negative for abdominal pain, blood in stool, constipation, heartburn and nausea.  Genitourinary:  Negative for dysuria and urgency.  Musculoskeletal:  Negative for joint pain and myalgias.  Neurological:  Negative for dizziness and headaches.  Endo/Heme/Allergies:  Does not bruise/bleed easily.  Psychiatric/Behavioral:  Negative for depression. The patient is not nervous/anxious and does not have insomnia.     Allergies  Allergen Reactions  . Metformin And Related Nausea Only    Past Medical History:  Diagnosis Date  . ADHD   . Anxiety    Generalized and social  . Depression   . Diabetes mellitus without complication (HCC)    type 2  . Dyslipidemia associated with type 2 diabetes mellitus (HCC) 01/21/2017  . Erectile dysfunction 08/04/2016  . GERD (gastroesophageal reflux disease)   . Hypertension   . Insomnia 06/29/2016  . Low libido 08/04/2016  . Microalbuminuria   . Shoulder dislocation 2008   Left shoulder  . Social anxiety disorder   . Tobacco use disorder   . 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 NO PAST SURGERIES    . SHOULDER ARTHROSCOPY WITH DISTAL CLAVICLE RESECTION Right 12/17/2022   Procedure: RIGHT SHOULDER ARTHROSCOPY WITH DISTAL CLAVICLE RESECTION / CAPSULAR RELEASE and manipulation;  Surgeon: Huel Cote, MD;  Location: [facility] OR;  Service: Orthopedics;  Laterality: Right;  . SUBACROMIAL DECOMPRESSION Right 12/17/2022   Procedure: SUBACROMIAL DECOMPRESSION;  Surgeon: Huel Cote, MD;  Location: [facility] OR;  Service: Orthopedics;  Laterality: Right;    Social History   Socioeconomic History  . Marital status: Significant Other    Spouse name: Not on file  . Number of children: Not on file  . Years of education: Not on file  . Highest education level: Some college, no degree  Occupational History  . Not on file  Tobacco Use  . Smoking status: Some Days    Current packs/day: 0.25    Average packs/day: 0.3 packs/day for 30.0 years (7.5 ttl pk-yrs)    Types: Cigarettes  . Smokeless tobacco: Never  Vaping Use  . Vaping status: Every Day  Substance and Sexual Activity  . Alcohol use: Yes    Alcohol/week: 24.0 standard drinks of alcohol    Types: 24 Shots of liquor per week  . Drug use: No  . Sexual activity: Yes    Partners: Female  Other Topics Concern  . Not on file  Social History Narrative  . Not on file   Social Drivers of Health  Financial Resource Strain: Medium Risk (08/05/2023)   Overall Financial Resource Strain (CARDIA)   . Difficulty of Paying Living Expenses: Somewhat hard  Food Insecurity: No Food Insecurity (08/05/2023)   Hunger Vital Sign   . Worried About Programme researcher, broadcasting/film/video in the Last Year: Never true   . Ran Out of Food in the Last Year: Never true  Transportation Needs: No Transportation Needs (08/05/2023)   PRAPARE - Transportation   . Lack of Transportation (Medical): No   . Lack of Transportation (Non-Medical): No  Physical Activity: Sufficiently Active (08/05/2023)    Exercise Vital Sign   . Days of Exercise per Week: 5 days   . Minutes of Exercise per Session: 90 min  Stress: Stress Concern Present (08/05/2023)   [employer] of Occupational Health - Occupational Stress Questionnaire   . Feeling of Stress : Very much  Social Connections: Socially Isolated (08/05/2023)   Social Connection and Isolation Panel [NHANES]   . Frequency of Communication with Friends and Family: Once a week   . Frequency of Social Gatherings with Friends and Family: Never   . Attends Religious Services: Never   . Active Member of Clubs or Organizations: No   . Attends Banker Meetings: Not on file   . Marital Status: Never married    Family History  Problem Relation Age of Onset  . Hypertension Mother   . Diabetes type II Mother   . Hypertension Sister   . Hypertension Maternal Grandmother   . Diabetes type II Maternal Grandmother   . Breast cancer Paternal Grandfather   . Breast cancer Paternal Grandmother     Health Maintenance  Topic Date Due  . HIV Screening  Never done  . Hepatitis C Screening  Never done  . Colonoscopy  Never done  . Diabetic kidney evaluation - Urine ACR  01/12/2018  . Pneumococcal Vaccine 61-17 Years old (2 of 2 - PCV) 02/18/2018  . COVID-19 Vaccine (3 - 2024-25 season) 03/14/2023  . FOOT EXAM  05/20/2023  . OPHTHALMOLOGY EXAM  05/20/2023  . Zoster Vaccines- Shingrix (1 of 2) 02/04/2024 (Originally 03/27/2022)  . HEMOGLOBIN A1C  09/10/2023  . Diabetic kidney evaluation - eGFR measurement  12/16/2023  . DTaP/Tdap/Td (2 - Td or Tdap) 12/05/2028  . HPV VACCINES  Aged Out  . INFLUENZA VACCINE  Discontinued     ----------------------------------------------------------------------------------------------------------------------------------------------------------------------------------------------------------------- Physical Exam There were no vitals taken for this visit.  Physical Exam Constitutional:       General: He is not in acute distress. HENT:     Head: Normocephalic and atraumatic.     Right Ear: Tympanic membrane and external ear normal.     Left Ear: Tympanic membrane and external ear normal.  Eyes:     General: No scleral icterus. Neck:     Thyroid: No thyromegaly.  Cardiovascular:     Rate and Rhythm: Normal rate and regular rhythm.     Heart sounds: Normal heart sounds.  Pulmonary:     Effort: Pulmonary effort is normal.     Breath sounds: Normal breath sounds.  Abdominal:     General: Bowel sounds are normal. There is no distension.     Palpations: Abdomen is soft.     Tenderness: There is no abdominal tenderness. There is no guarding.  Musculoskeletal:     Cervical back: Normal range of motion.  Lymphadenopathy:     Cervical: No cervical adenopathy.  Skin:    General: Skin is warm and dry.  Findings: No rash.  Neurological:     Mental Status: He is alert and oriented to person, place, and time.     Cranial Nerves: No cranial nerve deficit.     Motor: No abnormal muscle tone.  Psychiatric:        Mood and Affect: Mood normal.        Behavior: Behavior normal.    ------------------------------------------------------------------------------------------------------------------------------------------------------------------------------------------------------------------- Assessment and Plan  No problem-specific Assessment & Plan notes found for this encounter.   No orders of the defined types were placed in this encounter.   No follow-ups on file.    This visit occurred during the SARS-CoV-2 public health emergency.  Safety protocols were in place, including screening questions prior to the visit, additional usage of staff PPE, and extensive cleaning of exam room while observing appropriate contact time as indicated for disinfecting solutions.

## 2023-08-09 ENCOUNTER — Encounter: Payer: Self-pay | Admitting: Family Medicine

## 2023-08-09 NOTE — Assessment & Plan Note (Signed)
Blood pressure little low today however he denies any symptoms.  Encouraged to hydrate well.  Will have him check blood pressure at home and he will let me know if this is still running low.  I recommended to him that he reduce his amlodipine to half a tablet if blood pressure remains low.

## 2023-08-09 NOTE — Assessment & Plan Note (Signed)
Orders Placed This Encounter  Procedures   CMP14+EGFR   CBC with Differential/Platelet   Lipid Panel With LDL/HDL Ratio   PSA   Urine Microalbumin w/creat. ratio   Testosterone   Sed Rate (ESR)   C-reactive protein   ANA,IFA RA Diag Pnl w/rflx Tit/Patn   Vitamin D (25 hydroxy)   Labs per orders.  Possibly arthralgia related to poorly controlled diabetes.

## 2023-08-09 NOTE — Assessment & Plan Note (Signed)
Blood sugars are much better controlled.  Will continue Lantus at current strength.

## 2023-08-09 NOTE — Assessment & Plan Note (Signed)
Continue atorvastatin at current strength.

## 2023-08-11 LAB — CMP14+EGFR
ALT: 36 [IU]/L (ref 0–44)
AST: 37 [IU]/L (ref 0–40)
Albumin: 4.4 g/dL (ref 3.8–4.9)
Alkaline Phosphatase: 104 [IU]/L (ref 44–121)
BUN/Creatinine Ratio: 23 — ABNORMAL HIGH (ref 9–20)
BUN: 18 mg/dL (ref 6–24)
Bilirubin Total: 0.5 mg/dL (ref 0.0–1.2)
CO2: 20 mmol/L (ref 20–29)
Calcium: 9.8 mg/dL (ref 8.7–10.2)
Chloride: 98 mmol/L (ref 96–106)
Creatinine, Ser: 0.8 mg/dL (ref 0.76–1.27)
Globulin, Total: 2.2 g/dL (ref 1.5–4.5)
Glucose: 107 mg/dL — ABNORMAL HIGH (ref 70–99)
Potassium: 3.8 mmol/L (ref 3.5–5.2)
Sodium: 138 mmol/L (ref 134–144)
Total Protein: 6.6 g/dL (ref 6.0–8.5)
eGFR: 107 mL/min/{1.73_m2} (ref >59)

## 2023-08-11 LAB — CBC WITH DIFFERENTIAL/PLATELET
Basophils Absolute: 0.1 10*3/uL (ref 0.0–0.2)
Basos: 1 %
EOS (ABSOLUTE): 0.3 10*3/uL (ref 0.0–0.4)
Eos: 3 %
Hematocrit: 46.7 % (ref 37.5–51.0)
Hemoglobin: 16 g/dL (ref 13.0–17.7)
Immature Grans (Abs): 0.1 10*3/uL (ref 0.0–0.1)
Immature Granulocytes: 1 %
Lymphocytes Absolute: 1.6 10*3/uL (ref 0.7–3.1)
Lymphs: 16 %
MCH: 32.8 pg (ref 26.6–33.0)
MCHC: 34.3 g/dL (ref 31.5–35.7)
MCV: 96 fL (ref 79–97)
Monocytes Absolute: 0.7 10*3/uL (ref 0.1–0.9)
Monocytes: 7 %
Neutrophils Absolute: 7.7 10*3/uL — ABNORMAL HIGH (ref 1.4–7.0)
Neutrophils: 72 %
Platelets: 255 10*3/uL (ref 150–450)
RBC: 4.88 x10E6/uL (ref 4.14–5.80)
RDW: 12.6 % (ref 11.6–15.4)
WBC: 10.5 10*3/uL (ref 3.4–10.8)

## 2023-08-11 LAB — LIPID PANEL WITH LDL/HDL RATIO
Cholesterol, Total: 105 mg/dL (ref 100–199)
HDL: 38 mg/dL — ABNORMAL LOW (ref >39)
LDL Chol Calc (NIH): 53 mg/dL (ref 0–99)
LDL/HDL Ratio: 1.4 {ratio} (ref 0.0–3.6)
Triglycerides: 66 mg/dL (ref 0–149)
VLDL Cholesterol Cal: 14 mg/dL (ref 5–40)

## 2023-08-11 LAB — ANA,IFA RA DIAG PNL W/RFLX TIT/PATN
Cyclic Citrullin Peptide Ab: 8 U (ref 0–19)
Rheumatoid fact SerPl-aCnc: <10 [IU]/mL (ref <14.0)

## 2023-08-11 LAB — PSA: Prostate Specific Ag, Serum: 0.4 ng/mL (ref 0.0–4.0)

## 2023-08-11 LAB — MICROALBUMIN / CREATININE URINE RATIO
Creatinine, Urine: 148.8 mg/dL
Microalb/Creat Ratio: 75 mg/g{creat} — ABNORMAL HIGH (ref 0–29)
Microalbumin, Urine: 111.4 ug/mL

## 2023-08-11 LAB — SEDIMENTATION RATE: Sed Rate: 26 mm/h (ref 0–30)

## 2023-08-11 LAB — C-REACTIVE PROTEIN: CRP: 7 mg/L (ref 0–10)

## 2023-08-11 LAB — VITAMIN D 25 HYDROXY (VIT D DEFICIENCY, FRACTURES): Vit D, 25-Hydroxy: 28.3 ng/mL — ABNORMAL LOW (ref 30.0–100.0)

## 2023-08-11 LAB — TESTOSTERONE: Testosterone: 349 ng/dL (ref 264–916)

## 2023-08-17 ENCOUNTER — Telehealth: Payer: Self-pay

## 2023-08-17 DIAGNOSIS — Z0279 Encounter for issue of other medical certificate: Secondary | ICD-10-CM

## 2023-08-17 NOTE — Telephone Encounter (Signed)
Patient came by office to drop off Return to Work Release form for Medical Leaves of Absence form to be completed, forms placed in Dr. Ashley Royalty box, form aware of fee also, thanks.

## 2023-08-20 ENCOUNTER — Encounter: Payer: Self-pay | Admitting: Family Medicine

## 2023-08-20 NOTE — Telephone Encounter (Signed)
 Pt aware that forms have been completed and ready for pick up. Pt states he will come by the office Monday 08/23/2023 to pay fee $29 and pick up paperwork.

## 2023-10-04 ENCOUNTER — Encounter: Payer: Self-pay | Admitting: Family Medicine

## 2023-10-04 DIAGNOSIS — E1129 Type 2 diabetes mellitus with other diabetic kidney complication: Secondary | ICD-10-CM

## 2023-10-05 ENCOUNTER — Ambulatory Visit: Admitting: Family Medicine

## 2023-10-08 MED ORDER — BLOOD GLUCOSE TEST VI STRP
1.0000 | ORAL_STRIP | Freq: Three times a day (TID) | 0 refills | Status: AC
Start: 2023-10-08 — End: 2023-11-07

## 2023-10-08 MED ORDER — LANCET DEVICE MISC
1.0000 | Freq: Three times a day (TID) | 0 refills | Status: AC
Start: 2023-10-08 — End: 2023-11-07

## 2023-10-08 MED ORDER — BLOOD GLUCOSE MONITORING SUPPL DEVI
1.0000 | Freq: Three times a day (TID) | 0 refills | Status: AC
Start: 2023-10-08 — End: ?

## 2023-10-08 MED ORDER — LANCETS MISC. MISC
1.0000 | Freq: Three times a day (TID) | 0 refills | Status: AC
Start: 2023-10-08 — End: 2023-11-07

## 2023-10-08 NOTE — Telephone Encounter (Signed)
Orders placed in patient chart.

## 2023-10-24 ENCOUNTER — Encounter: Payer: Self-pay | Admitting: Family Medicine

## 2023-10-24 DIAGNOSIS — F4010 Social phobia, unspecified: Secondary | ICD-10-CM

## 2023-10-24 DIAGNOSIS — F4001 Agoraphobia with panic disorder: Secondary | ICD-10-CM

## 2023-10-27 DIAGNOSIS — H524 Presbyopia: Secondary | ICD-10-CM | POA: Diagnosis not present

## 2023-10-27 DIAGNOSIS — E119 Type 2 diabetes mellitus without complications: Secondary | ICD-10-CM | POA: Diagnosis not present

## 2023-10-29 ENCOUNTER — Other Ambulatory Visit: Payer: Self-pay | Admitting: Family Medicine

## 2023-10-29 DIAGNOSIS — F4010 Social phobia, unspecified: Secondary | ICD-10-CM

## 2023-10-29 DIAGNOSIS — F4001 Agoraphobia with panic disorder: Secondary | ICD-10-CM

## 2023-12-03 ENCOUNTER — Ambulatory Visit: Payer: BLUE CROSS/BLUE SHIELD | Admitting: Family Medicine

## 2023-12-10 ENCOUNTER — Encounter: Payer: Self-pay | Admitting: Family Medicine

## 2024-01-12 ENCOUNTER — Encounter: Payer: Self-pay | Admitting: Family Medicine

## 2024-01-12 ENCOUNTER — Ambulatory Visit: Admitting: Family Medicine

## 2024-01-12 VITALS — BP 138/82 | HR 77 | Ht 73.0 in | Wt 233.0 lb

## 2024-01-12 DIAGNOSIS — Z794 Long term (current) use of insulin: Secondary | ICD-10-CM

## 2024-01-12 DIAGNOSIS — E1169 Type 2 diabetes mellitus with other specified complication: Secondary | ICD-10-CM

## 2024-01-12 DIAGNOSIS — R351 Nocturia: Secondary | ICD-10-CM | POA: Diagnosis not present

## 2024-01-12 DIAGNOSIS — E1159 Type 2 diabetes mellitus with other circulatory complications: Secondary | ICD-10-CM | POA: Diagnosis not present

## 2024-01-12 DIAGNOSIS — R6882 Decreased libido: Secondary | ICD-10-CM | POA: Diagnosis not present

## 2024-01-12 DIAGNOSIS — E785 Hyperlipidemia, unspecified: Secondary | ICD-10-CM

## 2024-01-12 DIAGNOSIS — M7501 Adhesive capsulitis of right shoulder: Secondary | ICD-10-CM

## 2024-01-12 DIAGNOSIS — I152 Hypertension secondary to endocrine disorders: Secondary | ICD-10-CM

## 2024-01-12 DIAGNOSIS — R809 Proteinuria, unspecified: Secondary | ICD-10-CM | POA: Diagnosis not present

## 2024-01-12 DIAGNOSIS — N401 Enlarged prostate with lower urinary tract symptoms: Secondary | ICD-10-CM

## 2024-01-12 DIAGNOSIS — E1129 Type 2 diabetes mellitus with other diabetic kidney complication: Secondary | ICD-10-CM | POA: Diagnosis not present

## 2024-01-12 DIAGNOSIS — F4010 Social phobia, unspecified: Secondary | ICD-10-CM

## 2024-01-12 DIAGNOSIS — F4001 Agoraphobia with panic disorder: Secondary | ICD-10-CM

## 2024-01-12 LAB — POCT GLYCOSYLATED HEMOGLOBIN (HGB A1C): HbA1c, POC (controlled diabetic range): 6.7 % (ref 0.0–7.0)

## 2024-01-12 MED ORDER — LANTUS SOLOSTAR 100 UNIT/ML ~~LOC~~ SOPN: 45.0000 [IU] | PEN_INJECTOR | Freq: Two times a day (BID) | SUBCUTANEOUS | 1 refills | Status: AC

## 2024-01-12 MED ORDER — SERTRALINE HCL 25 MG PO TABS
ORAL_TABLET | ORAL | 3 refills | Status: DC
Start: 2024-01-12 — End: 2024-02-28

## 2024-01-12 MED ORDER — CLONAZEPAM 2 MG PO TABS
ORAL_TABLET | ORAL | 5 refills | Status: AC
Start: 2024-01-12 — End: ?

## 2024-01-12 MED ORDER — VALSARTAN-HYDROCHLOROTHIAZIDE 160-25 MG PO TABS: 1.0000 | ORAL_TABLET | Freq: Every day | ORAL | 1 refills | Status: AC

## 2024-01-12 MED ORDER — AMLODIPINE BESYLATE 10 MG PO TABS: 10.0000 mg | ORAL_TABLET | Freq: Every day | ORAL | 2 refills | Status: AC

## 2024-01-12 NOTE — Assessment & Plan Note (Signed)
 Continue atorvastatin at current strength.

## 2024-01-12 NOTE — Assessment & Plan Note (Signed)
 ROM overall is better but still has pain with abduction with difficulty past 90 degrees.  Better since wearing sling and limiting heavier objects.  If not continuing to improve will refer back to Ortho.

## 2024-01-12 NOTE — Assessment & Plan Note (Signed)
 BP is well controlled at this time.  Will plan to continue current medications.

## 2024-01-12 NOTE — Assessment & Plan Note (Signed)
 He has tried multiple SSRIs/SNRIs without significant improvement.  Willing to try zoloft  again.  He may continue clonazepam  as needed for now.  Discussed trying to use this as little as possible.

## 2024-01-12 NOTE — Progress Notes (Signed)
 Albert [name] - 53 y.o. male MRN [id number]  Date of birth: September 23, 1970  Subjective Chief Complaint  Patient presents with   Diabetes   Hypertension   Shoulder Pain    HPI Albert [name] is a 53 y.o. male here today for follow up visit.    He reports that he is doing pretty well.  Continues to have problems with shoulder.  Histoyr of surgery due to adhesive capsulitis.  He has difficulty lifting heavier objects and with ROM, especially abduction.  Using sling which does help.  Blood sugars remain pretty well controlled with current medications.    BP is well controlled at this time.  Denies side effects from medication at current strength.   Has not had chest pain, shortness of breath, palpitations, headache or vision changes.   ROS:  A comprehensive ROS was completed and negative except as noted per HPI   Allergies  Allergen Reactions   Metformin  And Related Nausea Only    Past Medical History:  Diagnosis Date   ADHD    Anxiety    Generalized and social   Depression    Diabetes mellitus without complication (HCC)    type 2   Dyslipidemia associated with type 2 diabetes mellitus (HCC) 01/21/2017   Erectile dysfunction 08/04/2016   GERD (gastroesophageal reflux disease)    Hypertension    Insomnia 06/29/2016   Low libido 08/04/2016   Microalbuminuria    Shoulder dislocation 2008   Left shoulder   Social anxiety disorder    Tobacco use disorder    Tuberculosis    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    Past Surgical History:  Procedure Laterality Date   NO PAST SURGERIES     SHOULDER ARTHROSCOPY WITH DISTAL CLAVICLE RESECTION Right 12/17/2022   Procedure: RIGHT SHOULDER ARTHROSCOPY WITH DISTAL CLAVICLE RESECTION / CAPSULAR RELEASE and manipulation;  Surgeon: Genelle Standing, MD;  Location: [facility] OR;  Service: Orthopedics;  Laterality: Right;   SUBACROMIAL DECOMPRESSION Right 12/17/2022   Procedure: SUBACROMIAL DECOMPRESSION;  Surgeon: Genelle Standing, MD;  Location: [facility] OR;   Service: Orthopedics;  Laterality: Right;    Social History   Socioeconomic History   Marital status: Significant Other    Spouse name: Not on file   Number of children: Not on file   Years of education: Not on file   Highest education level: Some college, no degree  Occupational History   Not on file  Tobacco Use   Smoking status: Some Days    Current packs/day: 0.25    Average packs/day: 0.3 packs/day for 30.0 years (7.5 ttl pk-yrs)    Types: Cigarettes   Smokeless tobacco: Never  Vaping Use   Vaping status: Every Day  Substance and Sexual Activity   Alcohol use: Yes    Alcohol/week: 24.0 standard drinks of alcohol    Types: 24 Shots of liquor per week   Drug use: No   Sexual activity: Yes    Partners: Female  Other Topics Concern   Not on file  Social History Narrative   Not on file   Social Drivers of Health   Financial Resource Strain: Medium Risk (01/11/2024)   Overall Financial Resource Strain (CARDIA)    Difficulty of Paying Living Expenses: Somewhat hard  Food Insecurity: No Food Insecurity (01/11/2024)   Hunger Vital Sign    Worried About Running Out of Food in the Last Year: Never true    Ran Out of Food in the Last Year: Never true  Transportation Needs: No Transportation  Needs (01/11/2024)   PRAPARE - Administrator, Civil Service (Medical): No    Lack of Transportation (Non-Medical): No  Physical Activity: Sufficiently Active (01/11/2024)   Exercise Vital Sign    Days of Exercise per Week: 5 days    Minutes of Exercise per Session: 100 min  Stress: Stress Concern Present (01/11/2024)   [employer] of Occupational Health - Occupational Stress Questionnaire    Feeling of Stress: Very much  Social Connections: Socially Isolated (01/11/2024)   Social Connection and Isolation Panel    Frequency of Communication with Friends and Family: Once a week    Frequency of Social Gatherings with Friends and Family: Never    Attends Religious Services:  Never    Database administrator or Organizations: No    Attends Engineer, structural: Not on file    Marital Status: Never married    Family History  Problem Relation Age of Onset   Hypertension Mother    Diabetes type II Mother    Hypertension Sister    Hypertension Maternal Grandmother    Diabetes type II Maternal Grandmother    Breast cancer Paternal Grandfather    Breast cancer Paternal Grandmother     Health Maintenance  Topic Date Due   OPHTHALMOLOGY EXAM  05/20/2023   Zoster Vaccines- Shingrix (1 of 2) 02/04/2024 (Originally 03/27/2022)   Pneumococcal Vaccine 9-31 Years old (2 of 2 - PCV) 08/05/2024 (Originally 02/18/2018)   Hepatitis B Vaccines (1 of 3 - 19+ 3-dose series) 01/11/2025 (Originally 03/28/1991)   Colonoscopy  01/11/2025 (Originally 03/27/2017)   Hepatitis C Screening  01/11/2025 (Originally 03/27/1990)   HIV Screening  01/11/2025 (Originally 03/28/1987)   COVID-19 Vaccine (3 - 2024-25 season) 01/27/2025 (Originally 03/14/2023)   HEMOGLOBIN A1C  07/14/2024   Diabetic kidney evaluation - eGFR measurement  08/05/2024   Diabetic kidney evaluation - Urine ACR  08/05/2024   FOOT EXAM  08/05/2024   DTaP/Tdap/Td (2 - Td or Tdap) 12/05/2028   HPV VACCINES  Aged Out   Meningococcal B Vaccine  Aged Out   INFLUENZA VACCINE  Discontinued     ----------------------------------------------------------------------------------------------------------------------------------------------------------------------------------------------------------------- Physical Exam BP 138/82 (BP Location: Left Arm, Patient Position: Sitting, Cuff Size: Normal)   Pulse 77   Ht 6' 1 (1.854 m)   Wt 233 lb (105.7 kg)   SpO2 97%   BMI 30.74 kg/m   Physical Exam Constitutional:      Appearance: Normal appearance.  HENT:     Head: Normocephalic and atraumatic.  Cardiovascular:     Rate and Rhythm: Normal rate and regular rhythm.  Pulmonary:     Effort: Pulmonary effort is  normal.     Breath sounds: Normal breath sounds.  Musculoskeletal:     Cervical back: Neck supple.  Neurological:     General: No focal deficit present.     Mental Status: He is alert.  Psychiatric:        Mood and Affect: Mood normal.        Behavior: Behavior normal.     ------------------------------------------------------------------------------------------------------------------------------------------------------------------------------------------------------------------- Assessment and Plan  Type 2 diabetes mellitus with microalbuminuria, with long-term current use of insulin  (HCC) Blood sugars are much better controlled.  Will continue Lantus  at current strength.  Panic disorder with agoraphobia He has tried multiple SSRIs/SNRIs without significant improvement.  Willing to try zoloft  again.  He may continue clonazepam  as needed for now.  Discussed trying to use this as little as possible.  Dyslipidemia associated with type 2 diabetes  mellitus (HCC) Continue atorvastatin  at current strength.   Adhesive capsulitis of right shoulder ROM overall is better but still has pain with abduction with difficulty past 90 degrees.  Better since wearing sling and limiting heavier objects.  If not continuing to improve will refer back to Ortho.   Hypertension associated with diabetes (HCC) BP is well controlled at this time.  Will plan to continue current medications.     Meds ordered this encounter  Medications   sertraline  (ZOLOFT ) 25 MG tablet    Sig: Take 1/2 tab po daily x10 days then increase to full tab    Dispense:  30 tablet    Refill:  3   amLODipine  (NORVASC ) 10 MG tablet    Sig: Take 1 tablet (10 mg total) by mouth daily.    Dispense:  90 tablet    Refill:  2   insulin  glargine (LANTUS  SOLOSTAR) 100 UNIT/ML Solostar Pen    Sig: Inject 45 Units into the skin 2 (two) times daily.    Dispense:  81 mL    Refill:  1   valsartan -hydrochlorothiazide  (DIOVAN -HCT) 160-25  MG tablet    Sig: Take 1 tablet by mouth daily.    Dispense:  90 tablet    Refill:  1    No follow-ups on file.

## 2024-01-12 NOTE — Assessment & Plan Note (Signed)
 Blood sugars are much better controlled.  Will continue Lantus at current strength.

## 2024-01-13 LAB — CBC WITH DIFFERENTIAL/PLATELET
Basophils Absolute: 0.1 10*3/uL (ref 0.0–0.2)
Basos: 2 %
EOS (ABSOLUTE): 0.2 10*3/uL (ref 0.0–0.4)
Eos: 4 %
Hematocrit: 48.1 % (ref 37.5–51.0)
Hemoglobin: 16.6 g/dL (ref 13.0–17.7)
Immature Grans (Abs): 0 10*3/uL (ref 0.0–0.1)
Immature Granulocytes: 0 %
Lymphocytes Absolute: 1.1 10*3/uL (ref 0.7–3.1)
Lymphs: 19 %
MCH: 34.7 pg — ABNORMAL HIGH (ref 26.6–33.0)
MCHC: 34.5 g/dL (ref 31.5–35.7)
MCV: 100 fL — ABNORMAL HIGH (ref 79–97)
Monocytes Absolute: 0.4 10*3/uL (ref 0.1–0.9)
Monocytes: 8 %
Neutrophils Absolute: 3.8 10*3/uL (ref 1.4–7.0)
Neutrophils: 67 %
Platelets: 190 10*3/uL (ref 150–450)
RBC: 4.79 x10E6/uL (ref 4.14–5.80)
RDW: 11.9 % (ref 11.6–15.4)
WBC: 5.7 10*3/uL (ref 3.4–10.8)

## 2024-01-13 LAB — CMP14+EGFR
ALT: 57 IU/L — ABNORMAL HIGH (ref 0–44)
AST: 59 IU/L — ABNORMAL HIGH (ref 0–40)
Albumin: 4.3 g/dL (ref 3.8–4.9)
Alkaline Phosphatase: 113 IU/L (ref 44–121)
BUN/Creatinine Ratio: 8 — ABNORMAL LOW (ref 9–20)
BUN: 6 mg/dL (ref 6–24)
Bilirubin Total: 0.4 mg/dL (ref 0.0–1.2)
CO2: 21 mmol/L (ref 20–29)
Calcium: 9.3 mg/dL (ref 8.7–10.2)
Chloride: 101 mmol/L (ref 96–106)
Creatinine, Ser: 0.72 mg/dL — ABNORMAL LOW (ref 0.76–1.27)
Globulin, Total: 2.2 g/dL (ref 1.5–4.5)
Glucose: 175 mg/dL — ABNORMAL HIGH (ref 70–99)
Potassium: 4.2 mmol/L (ref 3.5–5.2)
Sodium: 140 mmol/L (ref 134–144)
Total Protein: 6.5 g/dL (ref 6.0–8.5)
eGFR: 111 mL/min/{1.73_m2} (ref >59)

## 2024-01-13 LAB — TESTOSTERONE: Testosterone: 385 ng/dL (ref 264–916)

## 2024-01-13 LAB — PSA: Prostate Specific Ag, Serum: 0.6 ng/mL (ref 0.0–4.0)

## 2024-01-19 ENCOUNTER — Ambulatory Visit: Payer: Self-pay | Admitting: Family Medicine

## 2024-02-01 ENCOUNTER — Ambulatory Visit: Admitting: Family Medicine

## 2024-02-03 ENCOUNTER — Ambulatory Visit: Admitting: Family Medicine

## 2024-02-03 ENCOUNTER — Encounter: Payer: Self-pay | Admitting: Family Medicine

## 2024-02-03 ENCOUNTER — Ambulatory Visit

## 2024-02-03 VITALS — BP 167/92 | HR 120 | Ht 73.0 in | Wt 229.0 lb

## 2024-02-03 DIAGNOSIS — M25511 Pain in right shoulder: Secondary | ICD-10-CM

## 2024-02-03 DIAGNOSIS — M7989 Other specified soft tissue disorders: Secondary | ICD-10-CM | POA: Diagnosis not present

## 2024-02-03 DIAGNOSIS — G8929 Other chronic pain: Secondary | ICD-10-CM

## 2024-02-03 DIAGNOSIS — M7501 Adhesive capsulitis of right shoulder: Secondary | ICD-10-CM

## 2024-02-03 DIAGNOSIS — R2241 Localized swelling, mass and lump, right lower limb: Secondary | ICD-10-CM | POA: Diagnosis not present

## 2024-02-03 DIAGNOSIS — M79671 Pain in right foot: Secondary | ICD-10-CM | POA: Diagnosis not present

## 2024-02-03 MED ORDER — HYDROCODONE-ACETAMINOPHEN 5-325 MG PO TABS: 1.0000 | ORAL_TABLET | Freq: Three times a day (TID) | ORAL | 0 refills | Status: AC | PRN

## 2024-02-03 NOTE — Progress Notes (Signed)
 Albert [name] - 53 y.o. male MRN [id number]  Date of birth: 1971/03/31  Subjective Chief Complaint  Patient presents with   Foot Swelling    HPI Albert [name] is a 53 y.o. male here today with complaint of R foot pain.  He has quite a bit of pain on the Lateral R foot.  This started a few days ago. Area is tender to touch and he has pain with weight bearing.  NSAIDs and tylenol  has not really helped.  Does not recall injury to the area.   Also continues to have shoulder pain.  History of adhesive capsulitis, s/p manipulation in OR.  Continues to have pain.  He really like Dr. Genelle but feels that this is too far away.  Requesting orthopedics in [place].   ROS:  A comprehensive ROS was completed and negative except as noted per HPI  Allergies  Allergen Reactions   Metformin  And Related Nausea Only    Past Medical History:  Diagnosis Date   ADHD    Anxiety    Generalized and social   Depression    Diabetes mellitus without complication (HCC)    type 2   Dyslipidemia associated with type 2 diabetes mellitus (HCC) 01/21/2017   Erectile dysfunction 08/04/2016   GERD (gastroesophageal reflux disease)    Hypertension    Insomnia 06/29/2016   Low libido 08/04/2016   Microalbuminuria    Shoulder dislocation 2008   Left shoulder   Social anxiety disorder    Tobacco use disorder    Tuberculosis    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    Past Surgical History:  Procedure Laterality Date   NO PAST SURGERIES     SHOULDER ARTHROSCOPY WITH DISTAL CLAVICLE RESECTION Right 12/17/2022   Procedure: RIGHT SHOULDER ARTHROSCOPY WITH DISTAL CLAVICLE RESECTION / CAPSULAR RELEASE and manipulation;  Surgeon: Genelle Standing, MD;  Location: [facility] OR;  Service: Orthopedics;  Laterality: Right;   SUBACROMIAL DECOMPRESSION Right 12/17/2022   Procedure: SUBACROMIAL DECOMPRESSION;  Surgeon: Genelle Standing, MD;  Location: [facility] OR;  Service: Orthopedics;  Laterality: Right;    Social History    Socioeconomic History   Marital status: Significant Other    Spouse name: Not on file   Number of children: Not on file   Years of education: Not on file   Highest education level: Some college, no degree  Occupational History   Not on file  Tobacco Use   Smoking status: Some Days    Current packs/day: 0.25    Average packs/day: 0.3 packs/day for 30.0 years (7.5 ttl pk-yrs)    Types: Cigarettes   Smokeless tobacco: Never  Vaping Use   Vaping status: Every Day  Substance and Sexual Activity   Alcohol use: Yes    Alcohol/week: 24.0 standard drinks of alcohol    Types: 24 Shots of liquor per week   Drug use: No   Sexual activity: Yes    Partners: Female  Other Topics Concern   Not on file  Social History Narrative   Not on file   Social Drivers of Health   Financial Resource Strain: Medium Risk (01/11/2024)   Overall Financial Resource Strain (CARDIA)    Difficulty of Paying Living Expenses: Somewhat hard  Food Insecurity: No Food Insecurity (01/11/2024)   Hunger Vital Sign    Worried About Running Out of Food in the Last Year: Never true    Ran Out of Food in the Last Year: Never true  Transportation Needs: No Transportation Needs (01/11/2024)  PRAPARE - Administrator, Civil Service (Medical): No    Lack of Transportation (Non-Medical): No  Physical Activity: Sufficiently Active (01/11/2024)   Exercise Vital Sign    Days of Exercise per Week: 5 days    Minutes of Exercise per Session: 100 min  Stress: Stress Concern Present (01/11/2024)   [employer] of Occupational Health - Occupational Stress Questionnaire    Feeling of Stress: Very much  Social Connections: Socially Isolated (01/11/2024)   Social Connection and Isolation Panel    Frequency of Communication with Friends and Family: Once a week    Frequency of Social Gatherings with Friends and Family: Never    Attends Religious Services: Never    Database administrator or Organizations: No    Attends  Engineer, structural: Not on file    Marital Status: Never married    Family History  Problem Relation Age of Onset   Hypertension Mother    Diabetes type II Mother    Hypertension Sister    Hypertension Maternal Grandmother    Diabetes type II Maternal Grandmother    Breast cancer Paternal Grandfather    Breast cancer Paternal Grandmother     Health Maintenance  Topic Date Due   OPHTHALMOLOGY EXAM  05/20/2023   Zoster Vaccines- Shingrix (1 of 2) 02/04/2024 (Originally 03/27/2022)   Pneumococcal Vaccine 75-75 Years old (2 of 2 - PCV) 08/05/2024 (Originally 02/18/2018)   Hepatitis B Vaccines (1 of 3 - 19+ 3-dose series) 01/11/2025 (Originally 03/28/1991)   Colonoscopy  01/11/2025 (Originally 03/27/2017)   Hepatitis C Screening  01/11/2025 (Originally 03/27/1990)   HIV Screening  01/11/2025 (Originally 03/28/1987)   COVID-19 Vaccine (3 - 2024-25 season) 01/27/2025 (Originally 03/14/2023)   HEMOGLOBIN A1C  07/14/2024   Diabetic kidney evaluation - Urine ACR  08/05/2024   FOOT EXAM  08/05/2024   Diabetic kidney evaluation - eGFR measurement  01/11/2025   DTaP/Tdap/Td (2 - Td or Tdap) 12/05/2028   HPV VACCINES  Aged Out   Meningococcal B Vaccine  Aged Out   INFLUENZA VACCINE  Discontinued     ----------------------------------------------------------------------------------------------------------------------------------------------------------------------------------------------------------------- Physical Exam BP (!) 167/92 (BP Location: Left Arm, Patient Position: Sitting, Cuff Size: Normal)   Pulse (!) 120   Ht 6' 1 (1.854 m)   Wt 229 lb (103.9 kg)   SpO2 97%   BMI 30.21 kg/m   Physical Exam Constitutional:      Appearance: Normal appearance.  Musculoskeletal:     Comments: Bony prominence along lateral R foot.  Soft tissue swelling and ttp is present.    Neurological:     Mental Status: He is alert.      ------------------------------------------------------------------------------------------------------------------------------------------------------------------------------------------------------------------- Assessment and Plan  Right foot pain Xrays of R foot ordered. Short term norco.  Referral to podiatry.  FMLA paperwork for out of work x10 days.   Chronic right shoulder pain Continued R shoulder pain.  Referral to orthopedics.  Prefers ortho in [place].    Meds ordered this encounter  Medications   HYDROcodone -acetaminophen  (NORCO/VICODIN) 5-325 MG tablet    Sig: Take 1 tablet by mouth every 8 (eight) hours as needed for moderate pain (pain score 4-6).    Dispense:  15 tablet    Refill:  0    No follow-ups on file.

## 2024-02-03 NOTE — Assessment & Plan Note (Signed)
 Continued R shoulder pain.  Referral to orthopedics.  Prefers ortho in [place].

## 2024-02-03 NOTE — Assessment & Plan Note (Signed)
 Xrays of R foot ordered. Short term norco.  Referral to podiatry.  FMLA paperwork for out of work x10 days.

## 2024-02-10 ENCOUNTER — Ambulatory Visit: Payer: Self-pay | Admitting: Family Medicine

## 2024-02-10 DIAGNOSIS — M79671 Pain in right foot: Secondary | ICD-10-CM

## 2024-02-10 NOTE — Progress Notes (Signed)
 Albert [name], I am covering for Dr. Alvia while he is out of the office.  The x-ray on your right foot actually looks okay they did not see any fracture or dislocation no bony abnormality which is reassuring.  How would you feel about consulting with our podiatrist downstairs in regards to the swelling and pain?

## 2024-02-17 ENCOUNTER — Telehealth: Payer: Self-pay | Admitting: *Deleted

## 2024-02-17 ENCOUNTER — Encounter: Payer: Self-pay | Admitting: Podiatry

## 2024-02-17 ENCOUNTER — Ambulatory Visit: Admitting: Podiatry

## 2024-02-17 ENCOUNTER — Other Ambulatory Visit: Payer: Self-pay | Admitting: *Deleted

## 2024-02-17 DIAGNOSIS — M7671 Peroneal tendinitis, right leg: Secondary | ICD-10-CM | POA: Diagnosis not present

## 2024-02-17 DIAGNOSIS — M722 Plantar fascial fibromatosis: Secondary | ICD-10-CM

## 2024-02-17 MED ORDER — DEXAMETHASONE SODIUM PHOSPHATE 120 MG/30ML IJ SOLN
4.0000 mg | Freq: Once | INTRAMUSCULAR | Status: AC
  Administered 2024-02-17: 4 mg via INTRA_ARTICULAR

## 2024-02-17 MED ORDER — TRIAMCINOLONE ACETONIDE 10 MG/ML IJ SUSP
2.5000 mg | Freq: Once | INTRAMUSCULAR | Status: AC
  Administered 2024-02-17: 2.5 mg via INTRA_ARTICULAR

## 2024-02-17 NOTE — Progress Notes (Signed)
  Subjective:  Patient ID: Albert [name], male    DOB: 1970/11/03,   MRN: [id number]  Chief Complaint  Patient presents with   Foot Pain    I have a bump on the bottom of my right foot. (Lateral midfoot)    53 y.o. male presents for  concern of right foot pain that has been ongoing for several weeks. Denies any specific injury. Does work at [employer] on concrete floors. Denies any injury. Relates the outsdie of his foot is painful  and swollen. He has been taking hydrocodone  which helps. Patient is diabetic and last A1c was  Lab Results  Component Value Date   HGBA1C 6.7 01/12/2024   .   PCP:  Alvia Bring, DO    . Denies any other pedal complaints. Denies n/v/f/c.   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 CFT <3 seconds. Normal hair growth on digits. No edema.  Skin. No lacerations or abrasions bilateral feet.  Musculoskeletal: MMT 5/5 bilateral lower extremities in DF, PF, Inversion and Eversion. Deceased ROM in DF of ankle joint. Tender around insertion of peroneal tendon and some to platnar fifth metatarsal base area. Small fluctuance noted and edema noted.  No pain with DF PF or inversion. Painful with eversion noted.  Neurological: Sensation intact to light touch.   Assessment:   1. Peroneal tendonitis, right      Plan:  Patient was evaluated and treated and all questions answered. X-rays reviewed and  discussed with patient. Discussed peroneal tendinitis and treatment options at length with patient Discussed stretching exercises and provided handout. Unable to take NSAIDS.  Injection offered. Procedure below.  Dispensed Tri-Lock ankle brace. Discussed that if the symptoms do not improve can consider PT/MRI. Patient to return in 6 to 8 weeks or sooner if symptoms fail to improve or worsen.  Procedure: Injection Tendon/Ligament Discussed alternatives, risks, complications and verbal consent was obtained.  Location: Right peroneal brevis insertion. Skin Prep: Alcohol. Injectate: 1cc 0.5% marcaine  plain, 1 cc dexamethasone  0.5 cc kenalog   Disposition: Patient tolerated procedure well. Injection site dressed with a band-aid.  Post-injection care was discussed and return precautions discussed.     Asberry Failing, DPM

## 2024-02-17 NOTE — Telephone Encounter (Signed)
 I left him a message asking him to arrive a few minutes early for his appointment.  We need xrays of his foot.  We will direct him to imaging upon his arrival.

## 2024-02-17 NOTE — Patient Instructions (Signed)
 Peroneal Tendinopathy Rehab Ask your health care provider which exercises are safe for you. Do exercises exactly as told by your health care provider and adjust them as directed. It is normal to feel mild stretching, pulling, tightness, or discomfort as you do these exercises. Stop right away if you feel sudden pain or your pain gets worse. Do not begin these exercises until told by your health care provider. Stretching and range-of-motion exercises These exercises warm up your muscles and joints. They can help improve the movement and flexibility of your ankle. They may also help to relieve pain and stiffness. Gastrocnemius and soleus stretch, standing This is an exercise in which you stand on a step and use your body weight to stretch your calf muscles. To do this exercise: Stand on the edge of a step on the ball of your left / right foot. The ball of your foot is on the walking surface, right under your toes. Keep your other foot firmly on the same step. Hold on to the wall, a railing, or a chair for balance. Slowly lift your other foot, allowing your body weight to press your left / right heel down over the edge of the step. You should feel a stretch in your left / right calf (gastrocnemius and soleus). Hold this position for __________ seconds. Return both feet to the step. Repeat this exercise with a slight bend in your left / right knee. Repeat __________ times with your left / right knee straight and __________ times with your left / right knee bent. Complete this exercise __________ times a day. Strengthening exercises These exercises build strength and endurance in your foot and ankle. Endurance is the ability to use your muscles for a long time, even after they get tired. Ankle dorsiflexion with band  Secure a rubber exercise band or tube to an object, such as a table leg, that will not move when the band is pulled. Secure the other end of the band around your left / right foot. Sit on  the floor. Face the object with your left / right leg extended. The band or tube should be slightly tense when your foot is relaxed. Slowly flex your left / right ankle and toes to bring your foot toward you (dorsiflexion). Hold this position for __________ seconds. Let the band or tube slowly pull your foot back to the starting position. Repeat __________ times. Complete this exercise __________ times a day. Ankle eversion  Sit on the floor with your legs straight out in front of you. Loop a rubber exercise band or tube around the ball of your left / right foot. The ball of your foot is on the walking surface, right under your toes. Hold the ends of the band in your hands. You can also secure the band to a stable object. The band or tube should be slightly tense when your foot is relaxed. Slowly push your foot outward, away from your other leg (eversion). Hold this position for __________ seconds. Slowly return your foot to the starting position. Repeat __________ times. Complete this exercise __________ times a day. Plantar flexion, standing This exercise is sometimes called a standing heel raise. Stand with your feet shoulder-width apart. Place your hands on a wall or table to steady yourself as needed. Try not to use it for support. Keep your weight spread evenly over the width of your feet while you slowly rise up on your toes (plantar flexion). If told by your health care provider: Shift your weight  toward your left / right leg until you feel challenged. Stand on your left / right leg only. Hold this position for __________ seconds. Repeat __________ times. Complete this exercise __________ times a day. Single leg stand  Without shoes, stand near a railing or in a doorway. You may hold on to the railing or doorframe as needed. Stand on your left / right foot. Keep your big toe down on the floor and try to keep your arch lifted. Do not roll to the outside of your foot. If this  exercise is too easy, you can try it with your eyes closed or while standing on a pillow. Hold this position for __________ seconds. Repeat __________ times. Complete this exercise __________ times a day. This information is not intended to replace advice given to you by your health care provider. Make sure you discuss any questions you have with your health care provider. Document Revised: 10/23/2021 Document Reviewed: 10/23/2021 Elsevier Patient Education  2024 [employer].

## 2024-02-26 ENCOUNTER — Other Ambulatory Visit: Payer: Self-pay | Admitting: Family Medicine

## 2024-03-08 ENCOUNTER — Telehealth: Payer: Self-pay

## 2024-03-08 NOTE — Telephone Encounter (Signed)
 Recevied msg from [facility] stating no callback from pt to schedule per referral.   LVM for patient to contact [facility] @ [phone].

## 2024-03-09 ENCOUNTER — Encounter: Payer: Self-pay | Admitting: Family Medicine

## 2024-03-09 MED ORDER — FREESTYLE LIBRE 3 PLUS SENSOR MISC: 2 refills | Status: DC

## 2024-03-09 NOTE — Telephone Encounter (Signed)
 Requesting rx rf of Freestyle libre 3 sensors  Will have to be changed to [employer] 3 plus sensors Last written 03/18/2022 Last OV 01/12/2024 Upcoming appt = none Pended prescription

## 2024-03-10 ENCOUNTER — Encounter: Payer: Self-pay | Admitting: Family Medicine

## 2024-03-10 MED ORDER — DEXCOM G7 RECEIVER DEVI: 0 refills | Status: AC

## 2024-03-10 MED ORDER — DEXCOM G7 SENSOR MISC: 11 refills | Status: DC

## 2024-03-14 ENCOUNTER — Encounter: Payer: Self-pay | Admitting: Sports Medicine

## 2024-03-30 ENCOUNTER — Ambulatory Visit: Admitting: Podiatry

## 2024-03-31 ENCOUNTER — Ambulatory Visit: Admitting: Family Medicine

## 2024-03-31 ENCOUNTER — Encounter: Payer: Self-pay | Admitting: Family Medicine

## 2024-03-31 VITALS — BP 114/77 | HR 81 | Ht 73.0 in | Wt 224.0 lb

## 2024-03-31 DIAGNOSIS — M2550 Pain in unspecified joint: Secondary | ICD-10-CM

## 2024-03-31 DIAGNOSIS — M791 Myalgia, unspecified site: Secondary | ICD-10-CM

## 2024-03-31 DIAGNOSIS — Z23 Encounter for immunization: Secondary | ICD-10-CM | POA: Diagnosis not present

## 2024-03-31 DIAGNOSIS — Z79899 Other long term (current) drug therapy: Secondary | ICD-10-CM | POA: Diagnosis not present

## 2024-03-31 MED ORDER — DEXCOM G7 SENSOR MISC: 3 refills | Status: AC

## 2024-03-31 NOTE — Progress Notes (Signed)
 Albert [name] - 53 y.o. male MRN [id number]  Date of birth: 04-28-1971  Subjective Chief Complaint  Patient presents with   Diabetes   Generalized Body Aches    HPI Albert [name] is a 53 y.o. male here today for follow up visit.   He reports that he is experiencing diffuse pain.  Feels like everything hurts including his joints and muscles.  Does not recall any recent injury or overuse.  No recent illness, fever or chills.  He is not taking any new medications or supplements.  He does report that his blood sugars have looked pretty good recently.  He is in need of new CGM.Paperwork completed for work from where he was out a few additional days this week due to body pain.  ROS:  A comprehensive ROS was completed and negative except as noted per HPI    Allergies  Allergen Reactions   Metformin  And Related Nausea Only    Past Medical History:  Diagnosis Date   ADHD    Anxiety    Generalized and social   Depression    Diabetes mellitus without complication (HCC)    type 2   Dyslipidemia associated with type 2 diabetes mellitus (HCC) 01/21/2017   Erectile dysfunction 08/04/2016   GERD (gastroesophageal reflux disease)    Hypertension    Insomnia 06/29/2016   Low libido 08/04/2016   Microalbuminuria    Shoulder dislocation 2008   Left shoulder   Social anxiety disorder    Tobacco use disorder    Tuberculosis    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    Past Surgical History:  Procedure Laterality Date   NO PAST SURGERIES     SHOULDER ARTHROSCOPY WITH DISTAL CLAVICLE RESECTION Right 12/17/2022   Procedure: RIGHT SHOULDER ARTHROSCOPY WITH DISTAL CLAVICLE RESECTION / CAPSULAR RELEASE and manipulation;  Surgeon: Genelle Standing, MD;  Location: [facility] OR;  Service: Orthopedics;  Laterality: Right;   SUBACROMIAL DECOMPRESSION Right 12/17/2022   Procedure: SUBACROMIAL DECOMPRESSION;  Surgeon: Genelle Standing, MD;  Location: [facility] OR;  Service: Orthopedics;  Laterality: Right;    Social  History   Socioeconomic History   Marital status: Significant Other    Spouse name: Not on file   Number of children: Not on file   Years of education: Not on file   Highest education level: Some college, no degree  Occupational History   Not on file  Tobacco Use   Smoking status: Some Days    Current packs/day: 0.25    Average packs/day: 0.3 packs/day for 30.0 years (7.5 ttl pk-yrs)    Types: Cigarettes, E-cigarettes   Smokeless tobacco: Never   Tobacco comments:    I smoke occasionally and when I don't smoke cigarettes, I vape. 02/17/2024 DJM  Vaping Use   Vaping status: Every Day  Substance and Sexual Activity   Alcohol use: Yes    Alcohol/week: 24.0 standard drinks of alcohol    Types: 24 Shots of liquor per week    Comment: occasionally   Drug use: No   Sexual activity: Yes    Partners: Female  Other Topics Concern   Not on file  Social History Narrative   Not on file   Social Drivers of Health   Financial Resource Strain: Medium Risk (01/11/2024)   Overall Financial Resource Strain (CARDIA)    Difficulty of Paying Living Expenses: Somewhat hard  Food Insecurity: No Food Insecurity (01/11/2024)   Hunger Vital Sign    Worried About Running Out of Food in the Last  Year: Never true    Ran Out of Food in the Last Year: Never true  Transportation Needs: No Transportation Needs (01/11/2024)   PRAPARE - Administrator, Civil Service (Medical): No    Lack of Transportation (Non-Medical): No  Physical Activity: Sufficiently Active (01/11/2024)   Exercise Vital Sign    Days of Exercise per Week: 5 days    Minutes of Exercise per Session: 100 min  Stress: Stress Concern Present (01/11/2024)   [employer] of Occupational Health - Occupational Stress Questionnaire    Feeling of Stress: Very much  Social Connections: Socially Isolated (01/11/2024)   Social Connection and Isolation Panel    Frequency of Communication with Friends and Family: Once a week     Frequency of Social Gatherings with Friends and Family: Never    Attends Religious Services: Never    Database administrator or Organizations: No    Attends Engineer, structural: Not on file    Marital Status: Never married    Family History  Problem Relation Age of Onset   Hypertension Mother    Diabetes type II Mother    Hypertension Sister    Hypertension Maternal Grandmother    Diabetes type II Maternal Grandmother    Breast cancer Paternal Grandfather    Breast cancer Paternal Grandmother     Health Maintenance  Topic Date Due   OPHTHALMOLOGY EXAM  05/20/2023   COVID-19 Vaccine (3 - 2025-26 season) 06/16/2024 (Originally 03/13/2024)   Hepatitis B Vaccines 19-59 Average Risk (1 of 3 - 19+ 3-dose series) 01/11/2025 (Originally 03/28/1991)   Colonoscopy  01/11/2025 (Originally 03/27/2017)   Hepatitis C Screening  01/11/2025 (Originally 03/27/1990)   HIV Screening  01/11/2025 (Originally 03/28/1987)   Pneumococcal Vaccine: 50+ Years (2 of 2 - PCV) 03/31/2025 (Originally 02/18/2018)   Zoster Vaccines- Shingrix  (2 of 2) 05/26/2024   HEMOGLOBIN A1C  07/14/2024   Diabetic kidney evaluation - Urine ACR  08/05/2024   FOOT EXAM  08/05/2024   Diabetic kidney evaluation - eGFR measurement  01/11/2025   DTaP/Tdap/Td (2 - Td or Tdap) 12/05/2028   HPV VACCINES  Aged Out   Meningococcal B Vaccine  Aged Out   Influenza Vaccine  Discontinued     ----------------------------------------------------------------------------------------------------------------------------------------------------------------------------------------------------------------- Physical Exam BP 114/77 (BP Location: Left Arm, Patient Position: Sitting, Cuff Size: Large)   Pulse 81   Ht 6' 1 (1.854 m)   Wt 224 lb (101.6 kg)   SpO2 98%   BMI 29.55 kg/m   Physical Exam Constitutional:      Appearance: Normal appearance.  Eyes:     General: No scleral icterus. Cardiovascular:     Rate and Rhythm: Normal  rate and regular rhythm.  Pulmonary:     Effort: Pulmonary effort is normal.     Breath sounds: Normal breath sounds.  Neurological:     General: No focal deficit present.     Mental Status: He is alert.  Psychiatric:        Mood and Affect: Mood normal.        Behavior: Behavior normal.     ------------------------------------------------------------------------------------------------------------------------------------------------------------------------------------------------------------------- Assessment and Plan  Arthralgia Orders Placed This Encounter  Procedures   Varicella-zoster vaccine IM   Sed Rate (ESR)   C-reactive protein   B12   CK (Creatine Kinase)   Uric acid     Meds ordered this encounter  Medications   Continuous Glucose Sensor (DEXCOM G7 SENSOR) MISC    Sig: Use for continuous glucose monitoring.  Replace every 10 days.    Dispense:  9 each    Refill:  3    No follow-ups on file.

## 2024-04-01 LAB — URIC ACID: Uric Acid: 3.5 mg/dL — ABNORMAL LOW (ref 3.8–8.4)

## 2024-04-01 LAB — CK: Total CK: 30 U/L — ABNORMAL LOW (ref 41–331)

## 2024-04-01 LAB — SEDIMENTATION RATE: Sed Rate: 10 mm/h (ref 0–30)

## 2024-04-01 LAB — VITAMIN B12: Vitamin B-12: 384 pg/mL (ref 232–1245)

## 2024-04-01 LAB — C-REACTIVE PROTEIN: CRP: 4 mg/L (ref 0–10)

## 2024-04-02 NOTE — Assessment & Plan Note (Signed)
 Orders Placed This Encounter  Procedures   Varicella-zoster vaccine IM   Sed Rate (ESR)   C-reactive protein   B12   CK (Creatine Kinase)   Uric acid

## 2024-04-04 ENCOUNTER — Encounter: Payer: Self-pay | Admitting: Family Medicine

## 2024-04-04 ENCOUNTER — Ambulatory Visit: Admitting: Family Medicine

## 2024-04-04 VITALS — BP 129/79 | HR 79 | Ht 73.0 in | Wt 226.0 lb

## 2024-04-04 DIAGNOSIS — M2550 Pain in unspecified joint: Secondary | ICD-10-CM | POA: Diagnosis not present

## 2024-04-04 MED ORDER — MELOXICAM 15 MG PO TABS: 15.0000 mg | ORAL_TABLET | Freq: Every day | ORAL | 0 refills | Status: AC

## 2024-04-04 MED ORDER — PREDNISONE 20 MG PO TABS: 20.0000 mg | ORAL_TABLET | Freq: Two times a day (BID) | ORAL | 0 refills | Status: AC

## 2024-04-05 ENCOUNTER — Encounter: Payer: Self-pay | Admitting: Family Medicine

## 2024-04-05 NOTE — Assessment & Plan Note (Addendum)
 Continues to have arthralgias.  Unclear etiology.  His labs were unremarkable.   Will provide short burst of prednisone  as well as meloxicam  to see if this is helpful.

## 2024-04-05 NOTE — Progress Notes (Signed)
 Albert [name] - 53 y.o. male MRN [id number]  Date of birth: [birth year]  Subjective Chief Complaint  Patient presents with   Generalized Body Aches    Aches and pains all over x1 week. Has not taken anything to help relieve the pain    HPI Albert [name] is a 53 year old male here today for follow-up.  He was just seen a few days ago with complaint of generalized bodyaches.  Reports that he continues to have bodyaches.  He was supposed to return to work today however was unable to due to pain.  He describes pain just feeling achy all over.  We did check labs last week when he was here which were unremarkable, including normal inflammatory markers.He denies fevers, chills or recent illness.  ROS:  A comprehensive ROS was completed and negative except as noted per HPI  Allergies  Allergen Reactions   Metformin  And Related Nausea Only    Past Medical History:  Diagnosis Date   ADHD    Anxiety    Generalized and social   Depression    Diabetes mellitus without complication (HCC)    type 2   Dyslipidemia associated with type 2 diabetes mellitus (HCC) 01/21/2017   Erectile dysfunction 08/04/2016   GERD (gastroesophageal reflux disease)    Hypertension    Insomnia 06/29/2016   Low libido 08/04/2016   Microalbuminuria    Shoulder dislocation 2008   Left shoulder   Social anxiety disorder    Tobacco use disorder    Tuberculosis    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    Past Surgical History:  Procedure Laterality Date   NO PAST SURGERIES     SHOULDER ARTHROSCOPY WITH DISTAL CLAVICLE RESECTION Right 12/17/2022   Procedure: RIGHT SHOULDER ARTHROSCOPY WITH DISTAL CLAVICLE RESECTION / CAPSULAR RELEASE and manipulation;  Surgeon: Genelle Standing, MD;  Location: [facility];  Service: Orthopedics;  Laterality: Right;   SUBACROMIAL DECOMPRESSION Right 12/17/2022   Procedure: SUBACROMIAL DECOMPRESSION;  Surgeon: Genelle Standing, MD;  Location: [facility];  Service: Orthopedics;  Laterality: Right;     Social History   Socioeconomic History   Marital status: Significant Other    Spouse name: Not on file   Number of children: Not on file   Years of education: Not on file   Highest education level: Some college, no degree  Occupational History   Not on file  Tobacco Use   Smoking status: Some Days    Current packs/day: 0.25    Average packs/day: 0.3 packs/day for 30.0 years (7.5 ttl pk-yrs)    Types: Cigarettes, E-cigarettes   Smokeless tobacco: Never   Tobacco comments:    I smoke occasionally and when I don't smoke cigarettes, I vape. 02/17/2024 DJM  Vaping Use   Vaping status: Every Day  Substance and Sexual Activity   Alcohol use: Yes    Alcohol/week: 24.0 standard drinks of alcohol    Types: 24 Shots of liquor per week    Comment: occasionally   Drug use: No   Sexual activity: Yes    Partners: Female  Other Topics Concern   Not on file  Social History Narrative   Not on file   Social Drivers of Health   Financial Resource Strain: Medium Risk (01/11/2024)   Overall Financial Resource Strain (CARDIA)    Difficulty of Paying Living Expenses: Somewhat hard  Food Insecurity: No Food Insecurity (01/11/2024)   Hunger Vital Sign    Worried About Running Out of Food in the Last Year: Never true  Ran Out of Food in the Last Year: Never true  Transportation Needs: No Transportation Needs (01/11/2024)   PRAPARE - Administrator, Civil Service (Medical): No    Lack of Transportation (Non-Medical): No  Physical Activity: Sufficiently Active (01/11/2024)   Exercise Vital Sign    Days of Exercise per Week: 5 days    Minutes of Exercise per Session: 100 min  Stress: Stress Concern Present (01/11/2024)   [employer] of Occupational Health - Occupational Stress Questionnaire    Feeling of Stress: Very much  Social Connections: Socially Isolated (01/11/2024)   Social Connection and Isolation Panel    Frequency of Communication with Friends and Family: Once a  week    Frequency of Social Gatherings with Friends and Family: Never    Attends Religious Services: Never    Database administrator or Organizations: No    Attends Engineer, structural: Not on file    Marital Status: Never married    Family History  Problem Relation Age of Onset   Hypertension Mother    Diabetes type II Mother    Hypertension Sister    Hypertension Maternal Grandmother    Diabetes type II Maternal Grandmother    Breast cancer Paternal Grandfather    Breast cancer Paternal Grandmother     Health Maintenance  Topic Date Due   OPHTHALMOLOGY EXAM  05/20/2023   COVID-19 Vaccine (3 - 2025-26 season) 06/16/2024 (Originally 03/13/2024)   Hepatitis B Vaccines 19-59 Average Risk (1 of 3 - 19+ 3-dose series) 01/11/2025 (Originally 03/28/1991)   Colonoscopy  01/11/2025 (Originally 03/27/2017)   Hepatitis C Screening  01/11/2025 (Originally 03/27/1990)   HIV Screening  01/11/2025 (Originally 03/28/1987)   Pneumococcal Vaccine: 50+ Years (2 of 2 - PCV) 03/31/2025 (Originally 02/18/2018)   Zoster Vaccines- Shingrix  (2 of 2) 05/26/2024   HEMOGLOBIN A1C  07/14/2024   Diabetic kidney evaluation - Urine ACR  08/05/2024   FOOT EXAM  08/05/2024   Diabetic kidney evaluation - eGFR measurement  01/11/2025   DTaP/Tdap/Td (2 - Td or Tdap) 12/05/2028   HPV VACCINES  Aged Out   Meningococcal B Vaccine  Aged Out   Influenza Vaccine  Discontinued     ----------------------------------------------------------------------------------------------------------------------------------------------------------------------------------------------------------------- Physical Exam BP 129/79   Pulse 79   Ht 6' 1 (1.854 m)   Wt 226 lb (102.5 kg)   SpO2 99%   BMI 29.82 kg/m   Physical Exam Constitutional:      Appearance: Normal appearance.  Eyes:     General: No scleral icterus. Cardiovascular:     Rate and Rhythm: Normal rate and regular rhythm.  Pulmonary:     Effort: Pulmonary  effort is normal.     Breath sounds: Normal breath sounds.  Neurological:     Mental Status: He is alert.  Psychiatric:        Mood and Affect: Mood normal.        Behavior: Behavior normal.     ------------------------------------------------------------------------------------------------------------------------------------------------------------------------------------------------------------------- Assessment and Plan  Arthralgia Continues to have arthralgias.  Unclear etiology.  His labs were unremarkable.   Will provide short burst of prednisone  as well as meloxicam  to see if this is helpful.   Meds ordered this encounter  Medications   predniSONE  (DELTASONE ) 20 MG tablet    Sig: Take 1 tablet (20 mg total) by mouth 2 (two) times daily with a meal for 5 days.    Dispense:  10 tablet    Refill:  0   meloxicam  (MOBIC ) 15  MG tablet    Sig: Take 1 tablet (15 mg total) by mouth daily.    Dispense:  30 tablet    Refill:  0    No follow-ups on file.

## 2024-04-11 ENCOUNTER — Encounter: Payer: Self-pay | Admitting: Family Medicine

## 2024-04-14 ENCOUNTER — Ambulatory Visit (INDEPENDENT_AMBULATORY_CARE_PROVIDER_SITE_OTHER): Admitting: Podiatry

## 2024-04-14 DIAGNOSIS — Z91199 Patient's noncompliance with other medical treatment and regimen due to unspecified reason: Secondary | ICD-10-CM

## 2024-04-14 NOTE — Progress Notes (Signed)
 No show

## 2024-04-17 ENCOUNTER — Encounter: Payer: Self-pay | Admitting: Family Medicine

## 2024-04-18 ENCOUNTER — Encounter: Payer: Self-pay | Admitting: Family Medicine

## 2024-04-18 ENCOUNTER — Ambulatory Visit: Admitting: Family Medicine

## 2024-04-18 VITALS — BP 148/84 | HR 108 | Ht 73.0 in | Wt 223.0 lb

## 2024-04-18 DIAGNOSIS — M2550 Pain in unspecified joint: Secondary | ICD-10-CM

## 2024-04-18 DIAGNOSIS — G2581 Restless legs syndrome: Secondary | ICD-10-CM | POA: Insufficient documentation

## 2024-04-18 MED ORDER — GABAPENTIN 300 MG PO CAPS: 300.0000 mg | ORAL_CAPSULE | Freq: Three times a day (TID) | ORAL | 3 refills | Status: AC

## 2024-04-18 NOTE — Assessment & Plan Note (Signed)
 Continued joint pain as well as myalgias.  Lab testing so far has been negative.  Provided trial of gabapentin  to see if this is helpful.  He does not want to add duloxetine  at this time.  Will consider referral to rheumatology if symptoms persist.

## 2024-04-18 NOTE — Progress Notes (Signed)
 Albert [name] - 53 y.o. male MRN [id number]  Date of birth: [birth year]  Subjective Chief Complaint  Patient presents with   Joint Pain    HPI Albert [name] is a 53 y.o. male here today for follow up of arthralgias and myalgias.  These really started a few weeks ago.   He has had lab testing with normal ESR, CRP, CK, Uric acid, B12 which were all normal.  He has tried holding his statin without improvement so far.  Meloxicam  has not really provided any relief for him.  He has taken time away from work due to pain.  He does admit to some difficulty with sleep as well.   ROS:  A comprehensive ROS was completed and negative except as noted per HPI  Allergies  Allergen Reactions   Metformin  And Related Nausea Only    Past Medical History:  Diagnosis Date   ADHD    Anxiety    Generalized and social   Depression    Diabetes mellitus without complication (HCC)    type 2   Dyslipidemia associated with type 2 diabetes mellitus (HCC) 01/21/2017   Erectile dysfunction 08/04/2016   GERD (gastroesophageal reflux disease)    Hypertension    Insomnia 06/29/2016   Low libido 08/04/2016   Microalbuminuria    Shoulder dislocation 2008   Left shoulder   Social anxiety disorder    Tobacco use disorder    Tuberculosis    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    Past Surgical History:  Procedure Laterality Date   NO PAST SURGERIES     SHOULDER ARTHROSCOPY WITH DISTAL CLAVICLE RESECTION Right 12/17/2022   Procedure: RIGHT SHOULDER ARTHROSCOPY WITH DISTAL CLAVICLE RESECTION / CAPSULAR RELEASE and manipulation;  Surgeon: Genelle Standing, MD;  Location: [facility];  Service: Orthopedics;  Laterality: Right;   SUBACROMIAL DECOMPRESSION Right 12/17/2022   Procedure: SUBACROMIAL DECOMPRESSION;  Surgeon: Genelle Standing, MD;  Location: [facility];  Service: Orthopedics;  Laterality: Right;    Social History   Socioeconomic History   Marital status: Significant Other    Spouse name: Not on file   Number of  children: Not on file   Years of education: Not on file   Highest education level: Some college, no degree  Occupational History   Not on file  Tobacco Use   Smoking status: Some Days    Current packs/day: 0.25    Average packs/day: 0.3 packs/day for 30.0 years (7.5 ttl pk-yrs)    Types: Cigarettes, E-cigarettes   Smokeless tobacco: Never   Tobacco comments:    I smoke occasionally and when I don't smoke cigarettes, I vape. 02/17/2024 DJM  Vaping Use   Vaping status: Every Day  Substance and Sexual Activity   Alcohol use: Yes    Alcohol/week: 24.0 standard drinks of alcohol    Types: 24 Shots of liquor per week    Comment: occasionally   Drug use: No   Sexual activity: Yes    Partners: Female  Other Topics Concern   Not on file  Social History Narrative   Not on file   Social Drivers of Health   Financial Resource Strain: Medium Risk (01/11/2024)   Overall Financial Resource Strain (CARDIA)    Difficulty of Paying Living Expenses: Somewhat hard  Food Insecurity: No Food Insecurity (01/11/2024)   Hunger Vital Sign    Worried About Running Out of Food in the Last Year: Never true    Ran Out of Food in the Last Year: Never true  Transportation Needs: No Transportation Needs (01/11/2024)   PRAPARE - Administrator, Civil Service (Medical): No    Lack of Transportation (Non-Medical): No  Physical Activity: Sufficiently Active (01/11/2024)   Exercise Vital Sign    Days of Exercise per Week: 5 days    Minutes of Exercise per Session: 100 min  Stress: Stress Concern Present (01/11/2024)   [employer] of Occupational Health - Occupational Stress Questionnaire    Feeling of Stress: Very much  Social Connections: Socially Isolated (01/11/2024)   Social Connection and Isolation Panel    Frequency of Communication with Friends and Family: Once a week    Frequency of Social Gatherings with Friends and Family: Never    Attends Religious Services: Never    Doctor, general practice or Organizations: No    Attends Engineer, structural: Not on file    Marital Status: Never married    Family History  Problem Relation Age of Onset   Hypertension Mother    Diabetes type II Mother    Hypertension Sister    Hypertension Maternal Grandmother    Diabetes type II Maternal Grandmother    Breast cancer Paternal Grandfather    Breast cancer Paternal Grandmother     Health Maintenance  Topic Date Due   COVID-19 Vaccine (3 - 2025-26 season) 06/16/2024 (Originally 03/13/2024)   Hepatitis B Vaccines 19-59 Average Risk (1 of 3 - 19+ 3-dose series) 01/11/2025 (Originally 03/28/1991)   Colonoscopy  01/11/2025 (Originally 03/27/2017)   Hepatitis C Screening  01/11/2025 (Originally 03/27/1990)   HIV Screening  01/11/2025 (Originally 03/28/1987)   Pneumococcal Vaccine: 50+ Years (2 of 2 - PCV) 03/31/2025 (Originally 02/18/2018)   Zoster Vaccines- Shingrix  (2 of 2) 05/26/2024   HEMOGLOBIN A1C  07/14/2024   Diabetic kidney evaluation - Urine ACR  08/05/2024   FOOT EXAM  08/05/2024   OPHTHALMOLOGY EXAM  10/26/2024   Diabetic kidney evaluation - eGFR measurement  01/11/2025   DTaP/Tdap/Td (2 - Td or Tdap) 12/05/2028   HPV VACCINES  Aged Out   Meningococcal B Vaccine  Aged Out   Influenza Vaccine  Discontinued     ----------------------------------------------------------------------------------------------------------------------------------------------------------------------------------------------------------------- Physical Exam BP (!) 148/84 (BP Location: Left Arm, Patient Position: Sitting, Cuff Size: Normal)   Pulse (!) 108   Ht 6' 1 (1.854 m)   Wt 223 lb (101.2 kg)   SpO2 98%   BMI 29.42 kg/m   Physical Exam Constitutional:      Appearance: Normal appearance.  Cardiovascular:     Rate and Rhythm: Normal rate and regular rhythm.  Pulmonary:     Effort: Pulmonary effort is normal.     Breath sounds: Normal breath sounds.  Musculoskeletal:      Comments: No visible joint swelling.   Neurological:     General: No focal deficit present.     Mental Status: He is alert.  Psychiatric:        Mood and Affect: Mood normal.        Behavior: Behavior normal.     ------------------------------------------------------------------------------------------------------------------------------------------------------------------------------------------------------------------- Assessment and Plan  Arthralgia Continued joint pain as well as myalgias.  Lab testing so far has been negative.  Provided trial of gabapentin  to see if this is helpful.  He does not want to add duloxetine  at this time.  Will consider referral to rheumatology if symptoms persist.   Meds ordered this encounter  Medications   gabapentin  (NEURONTIN ) 300 MG capsule    Sig: Take 1 capsule (300 mg total) by  mouth 3 (three) times daily. May increase to 600mg  TID after 2 weeks if needed.    Dispense:  90 capsule    Refill:  3    Return in about 4 weeks (around 05/16/2024) for F/u pain.

## 2024-04-19 LAB — FERRITIN: Ferritin: 312 ng/mL (ref 30–400)

## 2024-04-28 ENCOUNTER — Ambulatory Visit: Payer: Self-pay | Admitting: Family Medicine

## 2024-05-08 ENCOUNTER — Ambulatory Visit: Admitting: Family Medicine

## 2024-05-10 ENCOUNTER — Ambulatory Visit: Admitting: Family Medicine

## 2024-05-10 ENCOUNTER — Encounter: Payer: Self-pay | Admitting: Family Medicine

## 2024-05-10 VITALS — BP 126/80 | HR 72 | Ht 73.0 in | Wt 230.0 lb

## 2024-05-10 DIAGNOSIS — Z794 Long term (current) use of insulin: Secondary | ICD-10-CM | POA: Diagnosis not present

## 2024-05-10 DIAGNOSIS — M2550 Pain in unspecified joint: Secondary | ICD-10-CM | POA: Diagnosis not present

## 2024-05-10 DIAGNOSIS — R809 Proteinuria, unspecified: Secondary | ICD-10-CM

## 2024-05-10 DIAGNOSIS — E1129 Type 2 diabetes mellitus with other diabetic kidney complication: Secondary | ICD-10-CM | POA: Diagnosis not present

## 2024-05-10 DIAGNOSIS — E1159 Type 2 diabetes mellitus with other circulatory complications: Secondary | ICD-10-CM | POA: Diagnosis not present

## 2024-05-10 DIAGNOSIS — I152 Hypertension secondary to endocrine disorders: Secondary | ICD-10-CM

## 2024-05-10 LAB — POCT GLYCOSYLATED HEMOGLOBIN (HGB A1C): HbA1c, POC (controlled diabetic range): 8 % — AB (ref 0.0–7.0)

## 2024-05-10 NOTE — Assessment & Plan Note (Signed)
 BP is well controlled at this time.  Will plan to continue current medications.

## 2024-05-10 NOTE — Assessment & Plan Note (Addendum)
 Improving.  ?viral cause vs arthropathy/neuropathy related to diabetes. Gabapentin  seems to help.   Encouraged continued walking and exercise. Will plan to return to work on 05/20/24

## 2024-05-10 NOTE — Assessment & Plan Note (Signed)
 Worsened since previous check.  Encouraged dietary changes.  Continue current medications.  F/u in 3 months.

## 2024-05-10 NOTE — Progress Notes (Signed)
 Albert [name] - 53 y.o. male MRN [id number]  Date of birth: 11/06/1970  Subjective Chief Complaint  Patient presents with   Diabetes    HPI Albert [name] is a 53 y.o. male here today for follow up visit.   He reports that he is doing a little better at this point.  Still has quite a bit of pain in his shoulders but other pain is improving. He is starting to walk more which does seem to help some.  Still has not returned to work. Hoping to feel well enough to return in about 1 week.  Needs updated paperwork completed.    His A1c is increased some since last visit. He has not been as diligent with diet. He is using medications regularly.    He has not been taking BP medication regularly recently.  BP better on second check.  Denies chest pain, shortness of breath, palpitations, headache or vision changes.   ROS:  A comprehensive ROS was completed and negative except as noted per HPI  Allergies  Allergen Reactions   Metformin  And Related Nausea Only    Past Medical History:  Diagnosis Date   ADHD    Anxiety    Generalized and social   Depression    Diabetes mellitus without complication (HCC)    type 2   Dyslipidemia associated with type 2 diabetes mellitus (HCC) 01/21/2017   Erectile dysfunction 08/04/2016   GERD (gastroesophageal reflux disease)    Hypertension    Insomnia 06/29/2016   Low libido 08/04/2016   Microalbuminuria    Shoulder dislocation 2008   Left shoulder   Social anxiety disorder    Tobacco use disorder    Tuberculosis    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    Past Surgical History:  Procedure Laterality Date   NO PAST SURGERIES     SHOULDER ARTHROSCOPY WITH DISTAL CLAVICLE RESECTION Right 12/17/2022   Procedure: RIGHT SHOULDER ARTHROSCOPY WITH DISTAL CLAVICLE RESECTION / CAPSULAR RELEASE and manipulation;  Surgeon: Genelle Standing, MD;  Location: [facility];  Service: Orthopedics;  Laterality: Right;   SUBACROMIAL DECOMPRESSION Right 12/17/2022   Procedure:  SUBACROMIAL DECOMPRESSION;  Surgeon: Genelle Standing, MD;  Location: [facility];  Service: Orthopedics;  Laterality: Right;    Social History   Socioeconomic History   Marital status: Significant Other    Spouse name: Not on file   Number of children: Not on file   Years of education: Not on file   Highest education level: Some college, no degree  Occupational History   Not on file  Tobacco Use   Smoking status: Some Days    Current packs/day: 0.25    Average packs/day: 0.3 packs/day for 30.0 years (7.5 ttl pk-yrs)    Types: Cigarettes, E-cigarettes   Smokeless tobacco: Never   Tobacco comments:    I smoke occasionally and when I don't smoke cigarettes, I vape. 02/17/2024 DJM  Vaping Use   Vaping status: Every Day  Substance and Sexual Activity   Alcohol use: Yes    Alcohol/week: 24.0 standard drinks of alcohol    Types: 24 Shots of liquor per week    Comment: occasionally   Drug use: No   Sexual activity: Yes    Partners: Female  Other Topics Concern   Not on file  Social History Narrative   Not on file   Social Drivers of Health   Financial Resource Strain: Medium Risk (05/07/2024)   Overall Financial Resource Strain (CARDIA)    Difficulty of Paying  Living Expenses: Somewhat hard  Food Insecurity: No Food Insecurity (05/07/2024)   Hunger Vital Sign    Worried About Running Out of Food in the Last Year: Never true    Ran Out of Food in the Last Year: Never true  Transportation Needs: No Transportation Needs (05/07/2024)   PRAPARE - Administrator, Civil Service (Medical): No    Lack of Transportation (Non-Medical): No  Physical Activity: Insufficiently Active (05/07/2024)   Exercise Vital Sign    Days of Exercise per Week: 3 days    Minutes of Exercise per Session: 20 min  Stress: Stress Concern Present (05/07/2024)   [employer] of Occupational Health - Occupational Stress Questionnaire    Feeling of Stress: Very much  Social Connections:  Socially Isolated (05/07/2024)   Social Connection and Isolation Panel    Frequency of Communication with Friends and Family: Three times a week    Frequency of Social Gatherings with Friends and Family: Never    Attends Religious Services: Never    Database Administrator or Organizations: No    Attends Engineer, Structural: Not on file    Marital Status: Never married    Family History  Problem Relation Age of Onset   Hypertension Mother    Diabetes type II Mother    Hypertension Sister    Hypertension Maternal Grandmother    Diabetes type II Maternal Grandmother    Breast cancer Paternal Grandfather    Breast cancer Paternal Grandmother     Health Maintenance  Topic Date Due   COVID-19 Vaccine (3 - 2025-26 season) 06/16/2024 (Originally 03/13/2024)   Hepatitis B Vaccines 19-59 Average Risk (1 of 3 - 19+ 3-dose series) 01/11/2025 (Originally 03/28/1991)   Colonoscopy  01/11/2025 (Originally 03/27/2017)   Hepatitis C Screening  01/11/2025 (Originally 03/27/1990)   HIV Screening  01/11/2025 (Originally 03/28/1987)   Pneumococcal Vaccine: 50+ Years (2 of 2 - PCV) 03/31/2025 (Originally 02/18/2018)   Zoster Vaccines- Shingrix  (2 of 2) 05/26/2024   Diabetic kidney evaluation - Urine ACR  08/05/2024   FOOT EXAM  08/05/2024   OPHTHALMOLOGY EXAM  10/26/2024   HEMOGLOBIN A1C  11/08/2024   Diabetic kidney evaluation - eGFR measurement  01/11/2025   DTaP/Tdap/Td (2 - Td or Tdap) 12/05/2028   HPV VACCINES  Aged Out   Meningococcal B Vaccine  Aged Out   Influenza Vaccine  Discontinued     ----------------------------------------------------------------------------------------------------------------------------------------------------------------------------------------------------------------- Physical Exam BP 126/80   Pulse 72   Ht 6' 1 (1.854 m)   Wt 230 lb (104.3 kg)   SpO2 98%   BMI 30.34 kg/m   Physical Exam Constitutional:      Appearance: Normal appearance.  Eyes:      General: No scleral icterus. Cardiovascular:     Rate and Rhythm: Normal rate and regular rhythm.  Pulmonary:     Effort: Pulmonary effort is normal.     Breath sounds: Normal breath sounds.  Musculoskeletal:     Cervical back: Neck supple.  Neurological:     General: No focal deficit present.     Mental Status: He is alert.  Psychiatric:        Mood and Affect: Mood normal.        Behavior: Behavior normal.     ------------------------------------------------------------------------------------------------------------------------------------------------------------------------------------------------------------------- Assessment and Plan  Type 2 diabetes mellitus with microalbuminuria, with long-term current use of insulin  (HCC) Worsened since previous check.  Encouraged dietary changes.  Continue current medications.  F/u in 3 months.  Arthralgia Improving.  ?viral cause vs arthropathy/neuropathy related to diabetes. Gabapentin  seems to help.   Encouraged continued walking and exercise. Will plan to return to work on 05/20/24  Hypertension associated with diabetes (HCC) BP is well controlled at this time.  Will plan to continue current medications.     No orders of the defined types were placed in this encounter.   Return in about 3 months (around 08/10/2024) for Type 2 Diabetes.

## 2024-08-10 ENCOUNTER — Ambulatory Visit: Admitting: Family Medicine
